# Patient Record
Sex: Female | Born: 1970 | Race: Asian | Hispanic: No | Marital: Married | Smoking: Never smoker
Health system: Southern US, Community
[De-identification: ages and names within clinical notes are randomized; demographics above are authoritative.]

## PROBLEM LIST (undated history)

## (undated) DIAGNOSIS — D649 Anemia, unspecified: Secondary | ICD-10-CM

## (undated) DIAGNOSIS — T8859XA Other complications of anesthesia, initial encounter: Secondary | ICD-10-CM

## (undated) DIAGNOSIS — J329 Chronic sinusitis, unspecified: Secondary | ICD-10-CM

## (undated) DIAGNOSIS — E041 Nontoxic single thyroid nodule: Secondary | ICD-10-CM

## (undated) DIAGNOSIS — B019 Varicella without complication: Secondary | ICD-10-CM

## (undated) DIAGNOSIS — Q674 Other congenital deformities of skull, face and jaw: Secondary | ICD-10-CM

## (undated) DIAGNOSIS — Z8709 Personal history of other diseases of the respiratory system: Secondary | ICD-10-CM

## (undated) DIAGNOSIS — N390 Urinary tract infection, site not specified: Secondary | ICD-10-CM

## (undated) DIAGNOSIS — R011 Cardiac murmur, unspecified: Secondary | ICD-10-CM

## (undated) DIAGNOSIS — R519 Headache, unspecified: Secondary | ICD-10-CM

## (undated) DIAGNOSIS — R7611 Nonspecific reaction to tuberculin skin test without active tuberculosis: Secondary | ICD-10-CM

## (undated) DIAGNOSIS — E162 Hypoglycemia, unspecified: Secondary | ICD-10-CM

## (undated) DIAGNOSIS — J189 Pneumonia, unspecified organism: Secondary | ICD-10-CM

## (undated) DIAGNOSIS — T7840XA Allergy, unspecified, initial encounter: Secondary | ICD-10-CM

## (undated) HISTORY — DX: Varicella without complication: B01.9

## (undated) HISTORY — PX: TUBAL LIGATION: SHX77

## (undated) HISTORY — DX: Nonspecific reaction to tuberculin skin test without active tuberculosis: R76.11

## (undated) HISTORY — DX: Allergy, unspecified, initial encounter: T78.40XA

---

## 2005-01-01 ENCOUNTER — Ambulatory Visit (HOSPITAL_COMMUNITY): Admission: RE | Admit: 2005-01-01 | Discharge: 2005-01-01 | Payer: Self-pay | Admitting: Infectious Diseases

## 2005-01-28 ENCOUNTER — Ambulatory Visit (HOSPITAL_COMMUNITY): Admission: RE | Admit: 2005-01-28 | Discharge: 2005-01-28 | Payer: Self-pay | Admitting: Infectious Diseases

## 2005-01-28 ENCOUNTER — Ambulatory Visit: Payer: Self-pay | Admitting: Infectious Diseases

## 2005-02-06 ENCOUNTER — Ambulatory Visit: Payer: Self-pay | Admitting: Infectious Diseases

## 2005-08-12 HISTORY — PX: TUBAL LIGATION: SHX77

## 2005-11-06 ENCOUNTER — Other Ambulatory Visit: Admission: RE | Admit: 2005-11-06 | Discharge: 2005-11-06 | Payer: Self-pay | Admitting: Obstetrics and Gynecology

## 2006-05-17 ENCOUNTER — Encounter (INDEPENDENT_AMBULATORY_CARE_PROVIDER_SITE_OTHER): Payer: Self-pay | Admitting: Specialist

## 2006-05-17 ENCOUNTER — Inpatient Hospital Stay (HOSPITAL_COMMUNITY): Admission: AD | Admit: 2006-05-17 | Discharge: 2006-05-20 | Payer: Self-pay | Admitting: Obstetrics and Gynecology

## 2006-10-27 ENCOUNTER — Ambulatory Visit (HOSPITAL_COMMUNITY): Admission: RE | Admit: 2006-10-27 | Discharge: 2006-10-27 | Payer: Self-pay | Admitting: Infectious Diseases

## 2006-11-06 ENCOUNTER — Ambulatory Visit: Payer: Self-pay | Admitting: Critical Care Medicine

## 2006-11-06 ENCOUNTER — Encounter (INDEPENDENT_AMBULATORY_CARE_PROVIDER_SITE_OTHER): Payer: Self-pay | Admitting: Infectious Diseases

## 2006-11-11 ENCOUNTER — Ambulatory Visit: Payer: Self-pay | Admitting: *Deleted

## 2007-02-19 ENCOUNTER — Ambulatory Visit: Payer: Self-pay | Admitting: Critical Care Medicine

## 2007-03-04 ENCOUNTER — Emergency Department (HOSPITAL_COMMUNITY): Admission: EM | Admit: 2007-03-04 | Discharge: 2007-03-04 | Payer: Self-pay | Admitting: Emergency Medicine

## 2009-01-09 ENCOUNTER — Emergency Department (HOSPITAL_COMMUNITY): Admission: EM | Admit: 2009-01-09 | Discharge: 2009-01-09 | Payer: Self-pay | Admitting: Family Medicine

## 2009-08-21 ENCOUNTER — Emergency Department (HOSPITAL_COMMUNITY): Admission: EM | Admit: 2009-08-21 | Discharge: 2009-08-21 | Payer: Self-pay | Admitting: Family Medicine

## 2010-12-25 NOTE — Assessment & Plan Note (Signed)
[facility]                             PULMONARY OFFICE NOTE   Amy Terry, Amy Terry                   MRN:          [id number]  DATE:02/19/2007                            DOB:          [birth year]    [name] returns today in followup.  She has left lingular  bronchiectasis with no evidence of active cough or no respiratory  complaints.   EXAM:  Temp 98, blood pressure 104/70, pulse 81, saturation 98% room  air.  CHEST:  Completely clear without evidence of wheeze, rale or rhonchi.  CARDIAC EXAM:  A regular rate and rhythm without S3, normal S1, S2.   A CT scan of the chest obtained recently shows minimal lingular  bronchiectasis and no evidence of disease activity.   IMPRESSION:  The patient has stable lingular right bronchiectasis with  positive PPD and no evidence of active tuberculosis infection.  At this  point, no recommended therapies are indicated and will see the patient  back on an as needed basis.     Charlcie Cradle Delford Field, MD, [facility]  Electronically Signed    PEW/MedQ  DD: 02/19/2007  DT: 02/19/2007  Job #: [id number]

## 2010-12-28 NOTE — H&P (Signed)
NAMESHARANYA, TEMPLIN            ACCOUNT NO.:  [id number]   MEDICAL RECORD NO.:  [id number]          PATIENT TYPE:  INP   LOCATION:  9171                          [facility]   PHYSICIAN:  Osborn Coho, M.D.   DATE OF BIRTH:  09-17-1970   DATE OF ADMISSION:  05/17/2006  DATE OF DISCHARGE:                                HISTORY & PHYSICAL   Ms. Rolfson is a 40 year old gravida 2, para 1-0-0-1 at 39-2/7 weeks who  presented complaining of spontaneous rupture of membranes at approximately 3  a.m. with clear fluid noted. The patient denies any pain. She reports  positive fetal movement. Pregnancy has been remarkable for:  1. Advanced maternal age with amnio and quadruple screen declined.  2. Strong family history of thyroid disease. The patient's labs were      within normal limits at 32 weeks.  3. Questionable last menstrual period.   PRENATAL LABORATORY:  Blood type is O positive. Rh antibody negative. VDRL  nonreactive. Rubella titer positive. Hepatitis B surface antigen negative.  HIV nonreactive. Hemoglobin electrophoresis was normal. Cystic fibrosis  testing was negative. Hemoglobin upon entering the practice was 12.7; it was  12 at 26 weeks. Quadruple screen was declined. TSH was done at the initial  visit and it was low at 0.05. It was reported again at 15 weeks with a value  of 0.242. Free T4 was normal and T3 was normal. Values were repeated again  at 22 weeks and they were within normal limits. Third trimester values were  also normal. The patient's Glucola was normal. GG, chlamydia and group B  Strep cultures were all negative at 36 weeks. EDC of May 23, 2006, was  established by ultrasound at 11 weeks.   HISTORY OF PRESENT PREGNANCY:  The patient entered care at approximately 10  weeks. She had an ultrasound at that time for dating. There was a complete  previa noted. She declined amnio and quadruple screen. Hemoglobin  electrophoresis was normal. Thyroid  values were done secondary to strong  family history of thyroid disease. Her TSH was low as previously noted at  her first visit, repeated again at 15 weeks which was still slightly low but  free T4 and T3 were normal. The other values in each subsequent trimester  were normal. She had an ultrasound at 18 weeks showing normal growth and  development. She did have a left choroid plexus cyst. She had a normal  Glucola. She had another ultrasound at 28 weeks showing normal growth.  Choroid plexus cyst was resolved. She had an ultrasound at approximately 36  weeks for presentation and the fetus was in a vertex presentation at that  time. The rest of her pregnancy was essentially uncomplicated. On her last  exam in the office she was 3 cm.   OBSTETRICAL HISTORY:  In 2000 she had a vaginal birth of a female infant,  weight 7 pounds 7 ounces at [redacted] weeks gestation. She was in labor  approximately 36 hours with a long prodromal phase. [place] child was [place] During that pregnancy she did have a partial  placenta  previa however she did deliver vaginally with no pain medication.   MEDICAL HISTORY:  She was on birth control pills before her first pregnancy.  She reports usual childhood illnesses. She had a UTI in 1991. She was  hospitalized for tonsillitis in the past. She does have a history of some  sporadic gastritis.   ALLERGIES:  SHE IS ALLERGIC TO SEAFOOD WHICH CAUSES ITCHING. THERE IS NO  EVIDENCE OF ALLERGY TO IODINE.   FAMILY HISTORY:  Her brothers and father have history of hypertension and  are deceased. Maternal grandmother has emphysema. Her 3 sisters have had  thyroidectomies. Her brother had a nephrectomy. Paternal grandmother had  Parkinson's disease. Remarkable for the patient being 35 at the time of  delivery. Her brother was also born with shorter fingers on one hand. The  father of the baby's nephew was born with some type of mental retardation.   SOCIAL  HISTORY:  The patient is married to the father of the baby. He is  involved and supportive. His name is Archivist. The patient is [place]  from [place] She is of the [employer]. She is college educated  and employed as a Designer, jewellery at [employer]. Her husband is also  graduate educated. She has been followed by the physician's service at  [facility]. She denies any alcohol, drug or tobacco use during this  pregnancy.   PHYSICAL EXAMINATION:  VITAL SIGNS:  Stable. The patient is afebrile.  HEENT:  Within normal limits.  LUNGS:  Breath sounds are clear.  HEART:  Regular rate and rhythm without murmur.  BREASTS:  Soft and nontender.  ABDOMEN:  Fundal height is approximately 38 cm. Estimated fetal weight 7 to  7-1/2 pounds. Uterine contractions are every 3-6 minutes, mild quality.  PELVIC:  Cervical exam is 3-4 cm, 50%, vertex at a -2 station. The patient  is noted to be leaking clear fluid.  EXTREMITIES:  Deep tendon reflexes are 2+ without clonus. There is no edema  noted. Fetal heart rate is reactive with no decelerations.   IMPRESSION:  1. Intrauterine pregnancy at 39-2/7 weeks.  2. Early labor with spontaneous rupture of membranes.  3. Negative group B Streptococcus.   PLAN:  1. Admit to birthing suite for consult with Dr. Su Hilt, who is attending      physician.  2. Routine physician orders.  3. Patient declines pain medication at present.  4. M.D.'s will follow.      Renaldo Reel Emilee Hero, C.N.M.      Osborn Coho, M.D.  Electronically Signed    VLL/MEDQ  D:  05/17/2006  T:  05/17/2006  Job:  [id number]

## 2010-12-28 NOTE — Assessment & Plan Note (Signed)
[facility]                             PULMONARY OFFICE NOTE   Amy Terry, Amy Terry                   MRN:          [id number]  DATE:11/13/2006                            DOB:          1971/02/09    Ms. [name]'s CT scan was reviewed.  She has minimal left lingular  bronchiectasis and no evidence of active or communicable tuberculosis.  At this point, I would not recommend therapy or bronchoscopy in this  patient.  The patient has been given this information.     Charlcie Cradle Delford Field, MD, [facility]  Electronically Signed    PEW/MedQ  DD: 11/13/2006  DT: 11/13/2006  Job #: [id number]   cc:   Rockey Situ. Flavia Shipper., M.D.

## 2010-12-28 NOTE — Op Note (Signed)
NAMELEILENE, DIPRIMA            ACCOUNT NO.:  [id number]   MEDICAL RECORD NO.:  [id number]          PATIENT TYPE:  INP   LOCATION:  9106                          [facility]   PHYSICIAN:  Osborn Coho, M.D.   DATE OF BIRTH:  1970/12/01   DATE OF PROCEDURE:  05/17/2006  DATE OF DISCHARGE:                                 OPERATIVE REPORT   PREOPERATIVE DIAGNOSES:  1. Term intrauterine pregnancy.  2. Spontaneous rupture of membranes, in early labor.  3. Augmentation  4. Failure of descent.  5. Desires permanent sterilization.   POSTOPERATIVE DIAGNOSES:  1. Term intrauterine pregnancy.  2. Spontaneous rupture of membranes, in early labor.  3. Augmentation  4. Failure of descent.  5. Desires permanent sterilization.   PROCEDURES:  1. Primary low transverse C-section.  2. Bilateral tubal ligation.   ANESTHESIA:  Epidural.   ATTENDING:  Osborn Coho, M.D.   ASSISTANT:  Cam Hai, C.N.M.   FLUIDS:  2100 cc.   URINE OUTPUT:  200 cc.   ESTIMATED BLOOD LOSS:  900 cc.   COMPLICATIONS:  None.   FINDINGS:  Live female infant, with Apgars of 9 at one minute and 10 at 5  minutes, Caryn Bee.  Normal-appearing bilateral ovaries and fallopian tubes.   SPECIMENS TO PATHOLOGY:  Placenta and bilateral portions of fallopian tubes.   PROCEDURE:  The patient was taken to the operating room after the risks,  benefits, and alternatives reviewed with the patient.  The patient  verbalized understanding, and consent signed and witnessed. The patient was  given a surgical level via the epidural, and prepped and draped in the  normal sterile fashion.  A Pfannenstiel skin incision was made and carried  down to the underlying layer of fascia with the scalpel and Bovie.  The  fascia was excised bilaterally in the midline with the scalpel and extended  bilaterally with the Mayo scissors.  Kocher clamps were placed on the  inferior aspect of the fascial incision, and the rectus muscle  excised from  the fascia.  The same was done on the superior aspect of the fascial  incision.  The rectus muscle was separated in the midline and the peritoneum  entered bluntly and extended manually.  The bladder blade was placed and  bladder flap created with the Metzenbaum scissors.  Uterine incision was  made the scalpel and extended bilaterally with the bandage scissors.  The  infant was delivered, and a body cord was noted.  Upon delivery of the  infant's head, the oropharynx and nasopharynx were bulb suctioned.  The cord  was clamped and cut, and the infant handed to the waiting pediatricians.  The placenta was removed via fundal massage, and uterus cleared of all clots  and debris.  2 g of Ancef was administered.  The incision was repaired with  0 Vicryl in a running-locked fashion, and a second imbricating layer was  performed.  The intra-abdominal cavity was irrigated, and the uterus was  exteriorized.  The right fallopian tube was grasped in its midportion, and  two ties of 2-0 plain were used to ligate the tube,  and the tube was excised  at the remaining stump and cauterized with the Bovie.  The same was done on  the left fallopian tube.  The portions of the bilateral fallopian tubes were  sent to pathology.  There was good hemostasis noted, and the uterus was  returned to the intra-abdominal cavity.  Copious irrigation was performed  once again, and normal-appearing bilateral ovaries and fallopian tubes were  noted.  The peritoneum was repaired with 2-0 chromic via a running stitch,  and the fascia was repaired with 0 Vicryl in a running fashion.  The  subcutaneous tissue was irrigated and made hemostatic with the Bovie and  reapproximated using three interrupted stitches of 2-0 plain.  The skin was  reapproximated using 3-0 Monocryl via a subcuticular stitch.  Half-inch  Steri-Strips were applied.  Sponge, lap, and needle count was correct.  The  patient tolerated the  procedure well and was returned to the recovery room  in good condition.      Osborn Coho, M.D.  Electronically Signed     AR/MEDQ  D:  05/17/2006  T:  05/19/2006  Job:  [id number]

## 2010-12-28 NOTE — Discharge Summary (Signed)
NAMETIFFANY, CALMES            ACCOUNT NO.:  [id number]   MEDICAL RECORD NO.:  [id number]          PATIENT TYPE:  INP   LOCATION:  9106                          [facility]   PHYSICIAN:  Osborn Coho, M.D.   DATE OF BIRTH:  May 31, 1971   DATE OF ADMISSION:  05/17/2006  DATE OF DISCHARGE:  05/20/2006                                 DISCHARGE SUMMARY   ADMISSION DIAGNOSES:  1. Intrauterine pregnancy at term.  2. Early labor.  3. Premature rupture of membranes.  4. Desires sterilization.   PROCEDURES:  1. Primary low transverse versus cesarean section.  2. Bilateral tubal ligation.   DISCHARGE DIAGNOSES:  1. Intrauterine pregnancy at term.  2. Premature rupture of membranes.  3. Failure of descent.  4. Sterilization.  5. Primary low transverse cesarean section.  6. Bilateral tubal ligation.   Ms. Castanon is a 40 year old gravida 2, para 1-0-0-1, who presents at term  with premature rupture of membranes at 3-4 cm.  Her labor was augmented with  Pitocin.  She did become complete and pushed for 2 hours with descent to a  +1 to +2 station and no further descent.  Options were discussed and the  patient agreed to proceed with primary low cesarean section and BTL for  failure to descend.  This was done on May 17, 2006, with Dr. Osborn Coho as surgeon.  The patient gave birth to an 8 pound 8 ounce female  infant named Caryn Bee with Apgar scores of 9 at one minute, 10 at five minutes.  Both the patient and infant have done well in the postpartum period.  Hemoglobin on the first postpartum day was 8.6.  the patient has been  asymptomatic in her anemia.  She is up ad lib. without any difficulties.  Her incision is clean, dry and intact and on this, her third postoperative  day she is judged to be in satisfactory condition for discharge.  Discharge  instructions per [facility] handout.   DISCHARGE MEDICATIONS:  1. Motrin 600 mg p.o. q.6h. p.r.n. pain.  2. Tylox one  to two p.o. q.3-4h. p.r.n. pain.  3. Prenatal vitamins.   Discharge follow-up will be at [facility] in 6 weeks.      Rica Koyanagi, C.N.M.      Osborn Coho, M.D.  Electronically Signed    SDM/MEDQ  D:  05/20/2006  T:  05/21/2006  Job:  [id number]

## 2010-12-28 NOTE — Assessment & Plan Note (Signed)
[facility]                             PULMONARY OFFICE NOTE   Amy Terry, Amy Terry                   MRN:          [id number]  DATE:11/06/2006                            DOB:          1971/01/31    CHIEF COMPLAINT:  Evaluate abnormal x-ray.   HISTORY OF PRESENT ILLNESS:  This is a 40 year old Filipino female new  to the [place] in 2002. Had a positive PPD prior to moving here. 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 She had an abnormal x-ray documented in 2006. It was  recommended that she take isoniazid. She took it for one month, became  pregnant and then stopped the medication. She has had no cough, dyspnea,  chest pain, fever, chills, sweats or any other constitutional symptoms.  Her weight has been stable. Recent repeat chest x-ray shows a nodule of  9-mm left upper lobe in addition to continuing left lingular infiltrate.  She is referred for further evaluation by Infectious Disease.   REVIEW OF SYSTEMS:  Otherwise, is noncontributory. She is a lifelong  never smoker.   PAST MEDICAL HISTORY:   MEDICAL HISTORY:  1. Allergies only.  2. C-section.  3. Tubal ligation.   MEDICATION ALLERGIES:  None.   MEDICATIONS:  No daily medications.   SOCIAL HISTORY:  Works as an [employer] fundraiser. Lives with her husband and children at  home. Does not smoke or drink.   FAMILY HISTORY:  Emphysema in mother, asthma in a sister. Review of  systems is otherwise noncontributory.   PHYSICAL EXAMINATION:  This is a well-developed, well-nourished white-  Asian female in no distress. Temperature 98.4, blood pressure 106/70,  pulse 87. Saturation is 97% on room air.  CHEST: Showed to be completely clear without evidence of wheeze or  rhonchi or rales.  CARDIAC: Showed a regular rate and rhythm without S3. Normal S1, S2.  ABDOMEN: Soft, nontender.  EXTREMITIES: Showed no edema or clubbing or venous disease.  SKIN: Was  clear.  NEUROLOGIC: Was intact.  HEENT: Showed no jugular venous distention. No lymphadenopathy.  Oropharynx was clear. Neck supple.   Chest x-ray is reviewed and showed nodule in the left upper lobe of 9-mm  in diameter and left lingular infiltrate.   IMPRESSION:  Here is that of probable exposure to tuberculosis with  perhaps reactivation.   PLAN:  Is to obtain a CT scan of the chest and based upon this, we may  pursue bronchoscopy.     Charlcie Cradle Delford Field, MD, [facility]  Electronically Signed    PEW/MedQ  DD: 11/06/2006  DT: 11/06/2006  Job #: [id number]   cc:   Rockey Situ. Flavia Shipper., M.D.

## 2011-05-27 LAB — URINE CULTURE

## 2011-05-27 LAB — POCT URINALYSIS DIP (DEVICE)
Glucose, UA: NEGATIVE
Hgb urine dipstick: NEGATIVE
Nitrite: POSITIVE — AB
Urobilinogen, UA: 0.2
pH: 6

## 2011-08-16 ENCOUNTER — Emergency Department (HOSPITAL_BASED_OUTPATIENT_CLINIC_OR_DEPARTMENT_OTHER)
Admission: EM | Admit: 2011-08-16 | Discharge: 2011-08-16 | Disposition: A | Discharge status: Home / Self Care | Payer: 59 | Attending: Emergency Medicine | Admitting: Emergency Medicine

## 2011-08-16 ENCOUNTER — Emergency Department (INDEPENDENT_AMBULATORY_CARE_PROVIDER_SITE_OTHER): Payer: 59

## 2011-08-16 DIAGNOSIS — R05 Cough: Secondary | ICD-10-CM

## 2011-08-16 DIAGNOSIS — J189 Pneumonia, unspecified organism: Secondary | ICD-10-CM | POA: Insufficient documentation

## 2011-08-16 DIAGNOSIS — R0602 Shortness of breath: Secondary | ICD-10-CM | POA: Insufficient documentation

## 2011-08-16 DIAGNOSIS — R509 Fever, unspecified: Secondary | ICD-10-CM

## 2011-08-16 DIAGNOSIS — R059 Cough, unspecified: Secondary | ICD-10-CM

## 2011-08-16 LAB — BASIC METABOLIC PANEL
BUN: 9 mg/dL (ref 6–23)
Chloride: 101 mEq/L (ref 96–112)
GFR calc Af Amer: >90 mL/min (ref >90)
Potassium: 3.5 mEq/L (ref 3.5–5.1)

## 2011-08-16 LAB — URINALYSIS, ROUTINE W REFLEX MICROSCOPIC
Bilirubin Urine: NEGATIVE
Hgb urine dipstick: NEGATIVE
Nitrite: NEGATIVE
Specific Gravity, Urine: 1.02 (ref 1.005–1.030)
pH: 7 (ref 5.0–8.0)

## 2011-08-16 LAB — CULTURE, BLOOD (ROUTINE X 2): Culture  Setup Time: 201301042355

## 2011-08-16 LAB — DIFFERENTIAL
Lymphocytes Relative: 6 % — ABNORMAL LOW (ref 12–46)
Lymphs Abs: 1 10*3/uL (ref 0.7–4.0)
Monocytes Absolute: 0.8 10*3/uL (ref 0.1–1.0)
Monocytes Relative: 5 % (ref 3–12)
Neutro Abs: 16.1 10*3/uL — ABNORMAL HIGH (ref 1.7–7.7)

## 2011-08-16 LAB — CBC
HCT: 34.2 % — ABNORMAL LOW (ref 36.0–46.0)
Hemoglobin: 11.2 g/dL — ABNORMAL LOW (ref 12.0–15.0)
WBC: 17.9 10*3/uL — ABNORMAL HIGH (ref 4.0–10.5)

## 2011-08-16 MED ORDER — DEXTROSE 5 % IV SOLN
500.0000 mg | Freq: Once | INTRAVENOUS | Status: AC
  Administered 2011-08-16: 500 mg via INTRAVENOUS
  Filled 2011-08-16: qty 500

## 2011-08-16 MED ORDER — OXYCODONE-ACETAMINOPHEN 5-325 MG PO TABS: 1.0000 | ORAL_TABLET | ORAL | Status: AC | PRN

## 2011-08-16 MED ORDER — OXYCODONE-ACETAMINOPHEN 5-325 MG PO TABS
1.0000 | ORAL_TABLET | Freq: Once | ORAL | Status: AC
  Administered 2011-08-16: 1 via ORAL
  Filled 2011-08-16: qty 1

## 2011-08-16 MED ORDER — AZITHROMYCIN 250 MG PO TABS: 250.0000 mg | ORAL_TABLET | Freq: Every day | ORAL | Status: AC

## 2011-08-16 MED ORDER — DEXTROSE 5 % IV SOLN
1.0000 g | Freq: Once | INTRAVENOUS | Status: AC
  Administered 2011-08-16: 1 g via INTRAVENOUS
  Filled 2011-08-16: qty 10

## 2011-08-16 MED ORDER — OSELTAMIVIR PHOSPHATE 75 MG PO CAPS: 75.0000 mg | ORAL_CAPSULE | Freq: Two times a day (BID) | ORAL | Status: AC

## 2011-08-16 MED ORDER — ACETAMINOPHEN 325 MG PO TABS
650.0000 mg | ORAL_TABLET | Freq: Once | ORAL | Status: AC
  Administered 2011-08-16: 650 mg via ORAL
  Filled 2011-08-16: qty 2

## 2011-08-16 MED ORDER — SODIUM CHLORIDE 0.9 % IV BOLUS (SEPSIS)
1000.0000 mL | Freq: Once | INTRAVENOUS | Status: AC
  Administered 2011-08-16: 1000 mL via INTRAVENOUS

## 2011-08-16 NOTE — ED Provider Notes (Signed)
History     CSN: [id number]  Arrival date & time 08/16/11  1247   First MD Initiated Contact with Patient 08/16/11 1302      Chief Complaint  Patient presents with  . Shortness of Breath  . Cough  . Fever   patient had cough, and cold symptoms for one month. She did see her primary care doctor, Dr. Edmon Crape who prescribed amoxicillin for 10 days. This morning. However, she states she felt worse, sick and weak. She had a fever noted in triage. Patient has had a productive cough of green phlegm and has had some pain in her right upper chest especially with coughing. She's had no nausea, vomiting, no sore throat, no rash, no neck pain or stiffness. 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 Denies any other complaints at this time.  (Consider location/radiation/quality/duration/timing/severity/associated sxs/prior treatment) HPI  History reviewed. No pertinent past medical history.  Past Surgical History  Procedure Date  . Cesarean section     No family history on file.  History  Substance Use Topics  . Smoking status: Not on file  . Smokeless tobacco: Never Used  . Alcohol Use: No    OB History    Grav Para Term Preterm Abortions TAB SAB Ect Mult Living                  Review of Systems  All other systems reviewed and are negative.    Allergies  Review of patient's allergies indicates no known allergies.  Home Medications   Current Outpatient Rx  Name Route Sig Dispense Refill  . FLUTICASONE PROPIONATE 50 MCG/ACT NA SUSP Nasal Place 2 sprays into the nose as needed.        BP 127/75  Pulse 117  Temp(Src) 101.6 F (38.7 C) (Oral)  Resp 18  Ht 5' (1.524 m)  Wt 100 lb (45.36 kg)  BMI 19.53 kg/m2  SpO2 100%  LMP 07/26/2011  Physical Exam  Nursing note and vitals reviewed. Constitutional: She is oriented to person, place, and time. She appears well-developed and well-nourished.  HENT:  Head: Normocephalic  and atraumatic.  Eyes: Conjunctivae and EOM are normal. Pupils are equal, round, and reactive to light.  Neck: Neck supple.  Cardiovascular: Regular rhythm.  Exam reveals no gallop and no friction rub.   No murmur heard.      Slightly tachycardic, no murmur, rub, or gallop.  Pulmonary/Chest: Breath sounds normal. No respiratory distress. She has no wheezes. She has no rales. She exhibits no tenderness.       Lungs are clear to auscultation, bilaterally, no wheezing, rales, or rhonchi. Respirations are even and unlabored.  Abdominal: Soft. Bowel sounds are normal. She exhibits no distension. There is no tenderness. There is no rebound and no guarding.  Musculoskeletal: Normal range of motion.  Neurological: She is alert and oriented to person, place, and time. No cranial nerve deficit. Coordination normal.  Skin: Skin is warm and dry. No rash noted.  Psychiatric: She has a normal mood and affect.    ED Course  Procedures (including critical care time)  Labs Reviewed - No data to display No results found.   No diagnosis found.    MDM  Pt is seen and examined;  Initial history and physical completed.  Will follow.     Tylenol for fever, chest x-ray ordered. Otherwise, appears quite stable at this time. Will follow closely  1:52 PM  Ivf, blood cx, lactate, labs, iv abx  Irais Mottram A. Patrica Duel, MD 08/17/11 [id number]

## 2011-08-16 NOTE — ED Notes (Signed)
Family at bedside. 

## 2011-08-16 NOTE — ED Notes (Signed)
Patient transported to X-ray 

## 2011-08-16 NOTE — ED Notes (Signed)
MD at bedside. 

## 2011-08-16 NOTE — ED Notes (Signed)
Pt ambulated in hallway without difficulty.  Family at bedside.

## 2011-08-16 NOTE — ED Notes (Signed)
Pt reports productive cough and "being sick" x 1 month but developed SHOB and pain in upper back this am.

## 2012-02-02 ENCOUNTER — Emergency Department (HOSPITAL_COMMUNITY)
Admission: EM | Admit: 2012-02-02 | Discharge: 2012-02-02 | Disposition: A | Discharge status: Home / Self Care | Payer: 59 | Source: Home / Self Care | Attending: Emergency Medicine | Admitting: Emergency Medicine

## 2012-02-02 ENCOUNTER — Encounter (HOSPITAL_COMMUNITY): Payer: Self-pay | Admitting: *Deleted

## 2012-02-02 DIAGNOSIS — J069 Acute upper respiratory infection, unspecified: Secondary | ICD-10-CM

## 2012-02-02 DIAGNOSIS — J040 Acute laryngitis: Secondary | ICD-10-CM

## 2012-02-02 HISTORY — DX: Chronic sinusitis, unspecified: J32.9

## 2012-02-02 NOTE — Discharge Instructions (Signed)
Use saline spray in your nose to help relieve your congestion.  Continue using the advil cold and sinus medicine as directed on the box to help manage your symptoms until you are well.  Drink LOTS of liquids. Gargle with salt water to help relieve your sore throat.  Drink hot herbal tea (such as peppermint or chamomile) with fresh lemon squeezed into it to help relieve your sore throat. Talk with your primary care doctor about FMLA forms.   Upper Respiratory Infection, Adult An upper respiratory infection (URI) is also known as the common cold. It is often caused by a type of germ (virus). Colds are easily spread (contagious). You can pass it to others by kissing, coughing, sneezing, or drinking out of the same glass. Usually, you get better in 1 or 2 weeks.  HOME CARE   Only take medicine as told by your doctor.   Use a warm mist humidifier or breathe in steam from a hot shower.   Drink enough water and fluids to keep your pee (urine) clear or pale yellow.   Get plenty of rest.   Return to work when your temperature is back to normal or as told by your doctor. You may use a face mask and wash your hands to stop your cold from spreading.  GET HELP RIGHT AWAY IF:   After the first few days, you feel you are getting worse.   You have questions about your medicine.   You have chills, shortness of breath, or brown or red spit (mucus).   You have yellow or brown snot (nasal discharge) or pain in the face, especially when you bend forward.   You have a fever, puffy (swollen) neck, pain when you swallow, or white spots in the back of your throat.   You have a bad headache, ear pain, sinus pain, or chest pain.   You have a high-pitched whistling sound when you breathe in and out (wheezing).   You have a lasting cough or cough up blood.   You have sore muscles or a stiff neck.  MAKE SURE YOU:   Understand these instructions.   Will watch your condition.   Will get help right away if  you are not doing well or get worse.  Document Released: 01/15/2008 Document Revised: 07/18/2011 Document Reviewed: 12/03/2010 [facility] Patient Information 2012 [place], [place].  Laryngitis Your exam shows you have laryngitis. This condition is due to inflammation around the vocal cords and causes hoarseness and cough. Laryngitis can often be related to a virus infection, excessive smoking, excessive talking or yelling, breathing toxic fumes, allergies, or reflux of acid from your stomach. Treatment is mainly voice rest. Talk as little as possible (this includes whispering). Use written notes to communicate until your voice is back to normal. Avoid smoking cigarettes, drink plenty of clear liquids, and rest frequently until all your symptoms have improved. You should be checked by your doctor if your hoarseness and cough are not improved after 5 days. Please see your doctor or go to the emergency right away if you have:  Trouble breathing or blood in your sputum.   Difficulty swallowing, persistent fever or increasing pain.  Document Released: 07/29/2005 Document Revised: 04/10/2011 Document Reviewed: 01/14/2007 [facility] Patient Information 2012 [place], [place].

## 2012-02-02 NOTE — ED Provider Notes (Signed)
Medical screening examination/treatment/procedure(s) were performed by non-physician practitioner and as supervising physician I was immediately available for consultation/collaboration.  Luiz Blare MD   Luiz Blare, MD 02/02/12 2120

## 2012-02-02 NOTE — ED Provider Notes (Signed)
History     CSN: [id number]  Arrival date & time 02/02/12  1640   First MD Initiated Contact with Patient 02/02/12 1819      Chief Complaint  Patient presents with  . Laryngitis  . Sore Throat  . Eye Problem  . Cough    (Consider location/radiation/quality/duration/timing/severity/associated sxs/prior treatment) HPI Comments: Sx began on 6/21.  Was seen at an outside urgent care on 6/22, tested for strep which was negative. Told she has viral infection.  Works nights, and last night at work did lots of talking, during the night her voice slowly became more and more hoarse.   Patient is a 41 y.o. female presenting with pharyngitis and cough. The history is provided by the patient.  Sore Throat This is a new problem. The current episode started more than 2 days ago. The problem occurs constantly. The problem has not changed since onset.Associated symptoms include headaches. Pertinent negatives include no chest pain, no abdominal pain and no shortness of breath. The symptoms are aggravated by coughing. Nothing relieves the symptoms. Treatments tried: OTC cold medicine. The treatment provided moderate relief.  Cough This is a new problem. The current episode started more than 2 days ago. The problem occurs every few minutes. The problem has not changed since onset.The cough is non-productive. There has been no fever. Associated symptoms include headaches, rhinorrhea and sore throat. Pertinent negatives include no chest pain, no chills and no shortness of breath. She has tried decongestants for the symptoms. The treatment provided moderate relief.    Past Medical History  Diagnosis Date  . Sinusitis     Past Surgical History  Procedure Date  . Cesarean section   . Cesarean section   . Tubal ligation     History reviewed. No pertinent family history.  History  Substance Use Topics  . Smoking status: Never Smoker   . Smokeless tobacco: Never Used  . Alcohol Use: No    OB  History    Grav Para Term Preterm Abortions TAB SAB Ect Mult Living                  Review of Systems  Constitutional: Negative for fever and chills.  HENT: Positive for congestion, sore throat, rhinorrhea, voice change, postnasal drip and sinus pressure.   Respiratory: Positive for cough. Negative for shortness of breath.   Cardiovascular: Negative for chest pain.  Gastrointestinal: Negative for abdominal pain.  Neurological: Positive for headaches.    Allergies  Review of patient's allergies indicates no known allergies.  Home Medications   Current Outpatient Rx  Name Route Sig Dispense Refill  . ADVIL COLD/SINUS PO Oral Take by mouth.    Marland Kitchen FLUTICASONE PROPIONATE 50 MCG/ACT NA SUSP Nasal Place 2 sprays into the nose as needed.        BP 108/61  Pulse 98  Temp 99.3 F (37.4 C) (Oral)  Resp 16  SpO2 98%  LMP 01/07/2012  Physical Exam  Constitutional: She appears well-developed and well-nourished. No distress.  HENT:  Right Ear: Tympanic membrane, external ear and ear canal normal.  Left Ear: Tympanic membrane, external ear and ear canal normal.  Nose: Mucosal edema present. Right sinus exhibits maxillary sinus tenderness and frontal sinus tenderness. Left sinus exhibits maxillary sinus tenderness and frontal sinus tenderness.  Mouth/Throat: Oropharynx is clear and moist.       Voice hoarse  Neck: Normal range of motion.  Cardiovascular: Normal rate and regular rhythm.   Pulmonary/Chest: Effort normal and  breath sounds normal.  Lymphadenopathy:       Head (right side): No submental, no submandibular and no tonsillar adenopathy present.       Head (left side): No submental, no submandibular and no tonsillar adenopathy present.    She has no cervical adenopathy.    ED Course  Procedures (including critical care time)  Labs Reviewed - No data to display No results found.   1. URI (upper respiratory infection)   2. Laryngitis       MDM  Pt sx most c/w  viral infection.  Discussed sx management with pt.  Encouraged the resting of her voice, offered her a note for work.  Pt asks for FMLA papers for this illness.  I explained I did not have the papers she needed, and recommended she speak with her pcp about this.          Cathlyn Parsons, NP 02/02/12 1850

## 2012-02-02 NOTE — ED Notes (Signed)
Pt with onset of cough/sore throat and bodyaches Thursday this morning voice hoarse and right eye drainage

## 2013-08-12 HISTORY — PX: VARICOSE VEIN SURGERY: SHX832

## 2013-11-01 ENCOUNTER — Emergency Department (HOSPITAL_COMMUNITY)
Admission: EM | Admit: 2013-11-01 | Discharge: 2013-11-01 | Disposition: A | Discharge status: Home / Self Care | Payer: 59 | Source: Home / Self Care

## 2013-11-12 ENCOUNTER — Encounter (HOSPITAL_COMMUNITY): Payer: Self-pay | Admitting: Emergency Medicine

## 2013-11-12 ENCOUNTER — Emergency Department (HOSPITAL_COMMUNITY)
Admission: EM | Admit: 2013-11-12 | Discharge: 2013-11-12 | Disposition: A | Discharge status: Home / Self Care | Payer: 59 | Source: Home / Self Care | Attending: Emergency Medicine | Admitting: Emergency Medicine

## 2013-11-12 DIAGNOSIS — R059 Cough, unspecified: Secondary | ICD-10-CM

## 2013-11-12 DIAGNOSIS — R05 Cough: Secondary | ICD-10-CM

## 2013-11-12 DIAGNOSIS — B302 Viral pharyngoconjunctivitis: Secondary | ICD-10-CM

## 2013-11-12 LAB — POCT RAPID STREP A: Streptococcus, Group A Screen (Direct): NEGATIVE

## 2013-11-12 MED ORDER — PSEUDOEPH-BROMPHEN-DM 30-2-10 MG/5ML PO SYRP: 5.0000 mL | ORAL_SOLUTION | ORAL | Status: DC | PRN

## 2013-11-12 NOTE — ED Provider Notes (Signed)
Medical screening examination/treatment/procedure(s) were performed by non-physician practitioner and as supervising physician I was immediately available for consultation/collaboration.  Philipp Deputy, M.D.   Harden Mo, MD 11/12/13 2136

## 2013-11-12 NOTE — ED Provider Notes (Signed)
CSN: [id number]     Arrival date & time 11/12/13  1733 History   First MD Initiated Contact with Patient 11/12/13 1906     Chief Complaint  Patient presents with  . Sore Throat   (Consider location/radiation/quality/duration/timing/severity/associated sxs/prior Treatment) HPI Comments: 43 year old female presents complaining of cough that started yesterday, and sore throat/crusting of her eyes today. She has also lost her voice today. The sore throat has gotten slightly better throughout the day. She has not had any fever, chills, NVD, chest pain, shortness of breath. She has close contact with her daughter had a very similar illness a few days ago.  Patient is a 43 y.o. female presenting with pharyngitis.  Sore Throat Pertinent negatives include no chest pain, no abdominal pain and no shortness of breath.    Past Medical History  Diagnosis Date  . Sinusitis    Past Surgical History  Procedure Laterality Date  . Cesarean section    . Cesarean section    . Tubal ligation     No family history on file. History  Substance Use Topics  . Smoking status: Never Smoker   . Smokeless tobacco: Never Used  . Alcohol Use: No   OB History   Grav Para Term Preterm Abortions TAB SAB Ect Mult Living                 Review of Systems  Constitutional: Negative for fever and chills.  HENT: Positive for sore throat. Negative for congestion.   Eyes: Positive for discharge and redness. Negative for visual disturbance.  Respiratory: Positive for cough. Negative for shortness of breath.   Cardiovascular: Negative for chest pain, palpitations and leg swelling.  Gastrointestinal: Negative for nausea, vomiting and abdominal pain.  Endocrine: Negative for polydipsia and polyuria.  Genitourinary: Negative for dysuria, urgency and frequency.  Musculoskeletal: Negative for arthralgias and myalgias.  Skin: Negative for rash.  Neurological: Negative for dizziness, weakness and light-headedness.     Allergies  Review of patient's allergies indicates no known allergies.  Home Medications   Current Outpatient Rx  Name  Route  Sig  Dispense  Refill  . brompheniramine-pseudoephedrine-DM 30-2-10 MG/5ML syrup   Oral   Take 5 mLs by mouth every 4 (four) hours as needed.   120 mL   0   . fluticasone (FLONASE) 50 MCG/ACT nasal spray   Nasal   Place 2 sprays into the nose as needed.           . Pseudoephedrine-Ibuprofen (ADVIL COLD/SINUS PO)   Oral   Take by mouth.          BP 105/72  Pulse 82  Temp(Src) 98.7 F (37.1 C) (Oral)  Resp 16  SpO2 98% Physical Exam  Nursing note and vitals reviewed. Constitutional: She is oriented to person, place, and time. Vital signs are normal. She appears well-developed and well-nourished. No distress.  HENT:  Head: Normocephalic and atraumatic.  Right Ear: External ear normal.  Left Ear: External ear normal.  Nose: Nose normal.  Mouth/Throat: Posterior oropharyngeal erythema (mild) present. No oropharyngeal exudate.  Eyes: Conjunctivae are normal. Right eye exhibits no discharge. Left eye exhibits no discharge.  Neck: Normal range of motion. Neck supple.  Cardiovascular: Normal rate and regular rhythm.  Exam reveals no gallop and no friction rub.   Murmur heard.  Decrescendo systolic murmur is present with a grade of 3/6  Pulmonary/Chest: Effort normal and breath sounds normal. No respiratory distress. She has no wheezes. She has no rales.  Lymphadenopathy:    She has no cervical adenopathy.  Neurological: She is alert and oriented to person, place, and time. She has normal strength. Coordination normal.  Skin: Skin is warm and dry. No rash noted. She is not diaphoretic.  Psychiatric: She has a normal mood and affect. Judgment normal.    ED Course  Procedures (including critical care time) Labs Review Labs Reviewed  CULTURE, GROUP A STREP  POCT RAPID STREP A ([facility] ONLY)   Imaging Review No results found.   MDM    1. Viral pharyngoconjunctivitis   2. Cough    Treating symptomatically.  F/u PRN if not improving.  F/U with PCP about heart murmur   Meds ordered this encounter  Medications  . brompheniramine-pseudoephedrine-DM 30-2-10 MG/5ML syrup    Sig: Take 5 mLs by mouth every 4 (four) hours as needed.    Dispense:  120 mL    Refill:  0    Order Specific Question:  Supervising Provider    Answer:  Ihor Gully D Baldwyn, PA-C 11/12/13 1928

## 2013-11-12 NOTE — Discharge Instructions (Signed)
Take ibuprofen and tylenol as needed for the sore throat.    Adenovirus Adenoviruses are viruses that usually cause breathing problems. They may also cause other illnesses, such as stomach flu, bladder infection, and rashes. CAUSES  Adenoviruses are passed by direct contact. This can happen from touching the contaminated hands of someone who has just gone to the bathroom. It can also be passed through contaminated water.  You may have the virus and give it to others without being sick yourself.  Some types of this virus occur naturally in most parts of the world. Most of these infections occur in children.  Epidemics are often centered around swimming pools and small lakes. Symptoms can include fever and pink eye.  Adenovirus 7 is a specific virus gotten by breathing in the virus. It typically causes severe problems in the breathing system. Patients who get the adenovirus by the mouth usually have less severe symptoms. Adenovirus caught by breathing in the virus is more common in the late winter, spring, and early summer. SYMPTOMS  Symptoms vary and can include:   Common cold symptoms.  Pneumonia.  Croup.  Bronchitis. Patients with HIV, transplant patients, and some cancer patients are more likely to have severe problems. Acute respiratory disease (ARD) can be caused by adenovirus in crowded conditions.  These viruses are not easily killed with common cleaning products. DIAGNOSIS  Blood tests can be used to identify the problem.  TREATMENT  Most infections are mild and require no therapy. The symptoms can be treated to make the patient comfortable.  Document Released: 10/19/2002 Document Revised: 10/21/2011 Document Reviewed: 06/03/2007 [facility] Patient Information 2014 [place].    Pharyngitis Pharyngitis is redness, pain, and swelling (inflammation) of your pharynx.  CAUSES  Pharyngitis is usually caused by infection. Most of the time, these infections are from viruses  (viral) and are part of a cold. However, sometimes pharyngitis is caused by bacteria (bacterial). Pharyngitis can also be caused by allergies. Viral pharyngitis may be spread from person to person by coughing, sneezing, and personal items or utensils (cups, forks, spoons, toothbrushes). Bacterial pharyngitis may be spread from person to person by more intimate contact, such as kissing.  SIGNS AND SYMPTOMS  Symptoms of pharyngitis include:   Sore throat.   Tiredness (fatigue).   Low-grade fever.   Headache.  Joint pain and muscle aches.  Skin rashes.  Swollen lymph nodes.  Plaque-like film on throat or tonsils (often seen with bacterial pharyngitis). DIAGNOSIS  Your health care provider will ask you questions about your illness and your symptoms. Your medical history, along with a physical exam, is often all that is needed to diagnose pharyngitis. Sometimes, a rapid strep test is done. Other lab tests may also be done, depending on the suspected cause.  TREATMENT  Viral pharyngitis will usually get better in 3 4 days without the use of medicine. Bacterial pharyngitis is treated with medicines that kill germs (antibiotics).  HOME CARE INSTRUCTIONS   Drink enough water and fluids to keep your urine clear or pale yellow.   Only take over-the-counter or prescription medicines as directed by your health care provider:   If you are prescribed antibiotics, make sure you finish them even if you start to feel better.   Do not take aspirin.   Get lots of rest.   Gargle with 8 oz of salt water ( tsp of salt per 1 qt of water) as often as every 1 2 hours to soothe your throat.   Throat lozenges (  if you are not at risk for choking) or sprays may be used to soothe your throat. SEEK MEDICAL CARE IF:   You have large, tender lumps in your neck.  You have a rash.  You cough up green, yellow-brown, or bloody spit. SEEK IMMEDIATE MEDICAL CARE IF:   Your neck becomes  stiff.  You drool or are unable to swallow liquids.  You vomit or are unable to keep medicines or liquids down.  You have severe pain that does not go away with the use of recommended medicines.  You have trouble breathing (not caused by a stuffy nose). MAKE SURE YOU:   Understand these instructions.  Will watch your condition.  Will get help right away if you are not doing well or get worse. Document Released: 07/29/2005 Document Revised: 05/19/2013 Document Reviewed: 04/05/2013 [facility] Patient Information 2014 [place].

## 2013-11-12 NOTE — ED Notes (Signed)
Patient complains of cough that started yesterday Today states she woke up with sore throat  Having yellow discharge from both eye

## 2013-11-14 LAB — CULTURE, GROUP A STREP

## 2014-06-08 DIAGNOSIS — R109 Unspecified abdominal pain: Secondary | ICD-10-CM | POA: Insufficient documentation

## 2014-06-08 DIAGNOSIS — M545 Low back pain, unspecified: Secondary | ICD-10-CM | POA: Insufficient documentation

## 2014-06-08 DIAGNOSIS — R6889 Other general symptoms and signs: Secondary | ICD-10-CM | POA: Insufficient documentation

## 2014-06-08 DIAGNOSIS — A084 Viral intestinal infection, unspecified: Secondary | ICD-10-CM | POA: Insufficient documentation

## 2014-06-08 DIAGNOSIS — R5383 Other fatigue: Secondary | ICD-10-CM | POA: Insufficient documentation

## 2014-06-08 DIAGNOSIS — J019 Acute sinusitis, unspecified: Secondary | ICD-10-CM | POA: Insufficient documentation

## 2014-07-21 ENCOUNTER — Other Ambulatory Visit: Payer: Self-pay | Admitting: Internal Medicine

## 2014-07-21 ENCOUNTER — Encounter: Payer: Self-pay | Admitting: Internal Medicine

## 2014-07-21 ENCOUNTER — Ambulatory Visit (INDEPENDENT_AMBULATORY_CARE_PROVIDER_SITE_OTHER): Payer: 59 | Admitting: Internal Medicine

## 2014-07-21 VITALS — BP 100/60 | HR 86 | Temp 98.6°F | Resp 16 | Ht 62.0 in | Wt 138.0 lb

## 2014-07-21 DIAGNOSIS — R002 Palpitations: Secondary | ICD-10-CM

## 2014-07-21 DIAGNOSIS — Z139 Encounter for screening, unspecified: Secondary | ICD-10-CM

## 2014-07-21 DIAGNOSIS — J3089 Other allergic rhinitis: Secondary | ICD-10-CM

## 2014-07-21 DIAGNOSIS — J309 Allergic rhinitis, unspecified: Secondary | ICD-10-CM | POA: Insufficient documentation

## 2014-07-21 LAB — COMPREHENSIVE METABOLIC PANEL
ALK PHOS: 52 U/L (ref 39–117)
ALT: 12 U/L (ref 0–35)
AST: 15 U/L (ref 0–37)
Albumin: 3.8 g/dL (ref 3.5–5.2)
BILIRUBIN TOTAL: 0.4 mg/dL (ref 0.2–1.2)
BUN: 8 mg/dL (ref 6–23)
CO2: 26 mEq/L (ref 19–32)
CREATININE: 0.6 mg/dL (ref 0.50–1.10)
Calcium: 8.7 mg/dL (ref 8.4–10.5)
Chloride: 104 mEq/L (ref 96–112)
Glucose, Bld: 76 mg/dL (ref 70–99)
Potassium: 4 mEq/L (ref 3.5–5.3)
SODIUM: 137 meq/L (ref 135–145)
Total Protein: 6.9 g/dL (ref 6.0–8.3)

## 2014-07-21 LAB — CBC WITH DIFFERENTIAL/PLATELET
BASOS PCT: 1 % (ref 0–1)
Basophils Absolute: 0.1 10*3/uL (ref 0.0–0.1)
EOS ABS: 0.2 10*3/uL (ref 0.0–0.7)
Eosinophils Relative: 2 % (ref 0–5)
HEMATOCRIT: 34.2 % — AB (ref 36.0–46.0)
Hemoglobin: 10.8 g/dL — ABNORMAL LOW (ref 12.0–15.0)
Lymphocytes Relative: 25 % (ref 12–46)
Lymphs Abs: 2.1 10*3/uL (ref 0.7–4.0)
MCH: 23.7 pg — ABNORMAL LOW (ref 26.0–34.0)
MCHC: 31.6 g/dL (ref 30.0–36.0)
MCV: 75.2 fL — ABNORMAL LOW (ref 78.0–100.0)
MONO ABS: 0.8 10*3/uL (ref 0.1–1.0)
MONOS PCT: 10 % (ref 3–12)
MPV: 8.8 fL — ABNORMAL LOW (ref 9.4–12.4)
NEUTROS PCT: 62 % (ref 43–77)
Neutro Abs: 5.1 10*3/uL (ref 1.7–7.7)
Platelets: 421 10*3/uL — ABNORMAL HIGH (ref 150–400)
RBC: 4.55 MIL/uL (ref 3.87–5.11)
RDW: 15.4 % (ref 11.5–15.5)
WBC: 8.3 10*3/uL (ref 4.0–10.5)

## 2014-07-21 LAB — LIPID PANEL
CHOL/HDL RATIO: 2.3 ratio
Cholesterol: 156 mg/dL (ref 0–200)
HDL: 68 mg/dL (ref >39)
LDL CALC: 80 mg/dL (ref 0–99)
Triglycerides: 42 mg/dL (ref <150)
VLDL: 8 mg/dL (ref 0–40)

## 2014-07-21 LAB — TSH: TSH: 1.331 u[IU]/mL (ref 0.350–4.500)

## 2014-07-21 NOTE — Patient Instructions (Signed)
Schedule 3D mm  Schedule CPE   See me as needed

## 2014-07-21 NOTE — Progress Notes (Signed)
   Subjective:    Patient ID: Amy Terry, female    DOB: 08/01/1971, 43 y.o.   MRN: [id number]  HPI New pt here for first visit.  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  Has not had any preventive care in 7-8 years.  NO pap since she delivered her 43 yo  Reports palpitations when she drinks coffee at work.  She works night shift and will drink 3 cups of coffee no dizziness no chest pain no syncope  No Known Allergies Past Medical History  Diagnosis Date  . Sinusitis   . Allergy    Past Surgical History  Procedure Laterality Date  . Cesarean section    . Cesarean section    . Tubal ligation     History   Social History  . Marital Status: Married    Spouse Name: N/A    Number of Children: N/A  . Years of Education: N/A   Occupational History  . Not on file.   Social History Main Topics  . Smoking status: Never Smoker   . Smokeless tobacco: Never Used  . Alcohol Use: No  . Drug Use: No  . Sexual Activity:    Partners: Male   Other Topics Concern  . Not on file   Social History Narrative   Family History  Problem Relation Age of Onset  . Hypertension Mother   . Emphysema Mother   . Hypertension Father   . Thyroid disease Sister   . Breast cancer Sister   . Hyperlipidemia Brother   . Thyroid disease Paternal Aunt   . Thyroid disease Sister   . Asthma Sister   . Anemia Sister   . Thyroid disease Sister    Patient Active Problem List   Diagnosis Date Noted  . Palpitations 07/21/2014  . Allergic rhinitis 07/21/2014   Current Outpatient Prescriptions on File Prior to Visit  Medication Sig Dispense Refill  . fluticasone (FLONASE) 50 MCG/ACT nasal spray Place 2 sprays into the nose as needed.       No current facility-administered medications on file prior to visit.       Review of Systems See hPI    Objective:   Physical Exam  Physical Exam  Nursing note and vitals reviewed.  Constitutional:  She is oriented to person, place, and time. She appears well-developed and well-nourished.  HENT:  Head: Normocephalic and atraumatic.  Cardiovascular: Normal rate and regular rhythm. Exam reveals no gallop and no friction rub.  No murmur heard.  Pulmonary/Chest: Breath sounds normal. She has no wheezes. She has no rales.  Neurological: She is alert and oriented to person, place, and time.  Skin: Skin is warm and dry.  Psychiatric: She has a normal mood and affect. Her behavior is normal.             Assessment & Plan:  Palpitations  EKG  NSR nonspecific st t changes  Allergic rhintiis  OK to take claritin D with nasonex OTC     Schedule CPE, 3D mm and will get labs today

## 2014-07-22 ENCOUNTER — Encounter: Payer: Self-pay | Admitting: *Deleted

## 2014-07-22 LAB — VITAMIN D 25 HYDROXY (VIT D DEFICIENCY, FRACTURES): Vit D, 25-Hydroxy: 22 ng/mL — ABNORMAL LOW (ref 30–100)

## 2014-07-25 ENCOUNTER — Telehealth: Payer: Self-pay | Admitting: Internal Medicine

## 2014-07-25 LAB — FERRITIN: Ferritin: 6 ng/mL — ABNORMAL LOW (ref 10–291)

## 2014-07-25 LAB — IRON AND TIBC
%SAT: 8 % — ABNORMAL LOW (ref 20–55)
Iron: 42 ug/dL (ref 42–145)
TIBC: 503 ug/dL — ABNORMAL HIGH (ref 250–470)
UIBC: 461 ug/dL — ABNORMAL HIGH (ref 125–400)

## 2014-07-25 LAB — FOLATE: FOLATE: 17.4 ng/mL

## 2014-07-25 LAB — VITAMIN B12: VITAMIN B 12: 773 pg/mL (ref 211–911)

## 2014-07-25 NOTE — Telephone Encounter (Signed)
Left message on voicemail to call regarding labs

## 2014-07-25 NOTE — Progress Notes (Signed)
Added anemia  profile- eh

## 2014-07-26 ENCOUNTER — Telehealth: Payer: Self-pay | Admitting: Internal Medicine

## 2014-07-26 DIAGNOSIS — D509 Iron deficiency anemia, unspecified: Secondary | ICD-10-CM | POA: Insufficient documentation

## 2014-07-26 MED ORDER — INTEGRA 62.5-62.5-40-3 MG PO CAPS: ORAL_CAPSULE | ORAL | Status: DC

## 2014-07-26 NOTE — Telephone Encounter (Signed)
Spoke with pt and informed of labs  She does have heavy menses  seh has iron deficiency anemia   Will start oral FE and wil refer to gyn fro eval

## 2014-08-17 ENCOUNTER — Ambulatory Visit: Payer: 59

## 2014-08-30 ENCOUNTER — Ambulatory Visit: Payer: 59

## 2014-10-21 ENCOUNTER — Other Ambulatory Visit: Payer: Self-pay

## 2014-10-21 ENCOUNTER — Ambulatory Visit: Admission: RE | Admit: 2014-10-21 | Payer: 59 | Source: Ambulatory Visit

## 2014-10-21 ENCOUNTER — Ambulatory Visit
Admission: RE | Admit: 2014-10-21 | Discharge: 2014-10-21 | Disposition: A | Discharge status: Home / Self Care | Payer: 59 | Source: Ambulatory Visit

## 2014-10-21 ENCOUNTER — Encounter (INDEPENDENT_AMBULATORY_CARE_PROVIDER_SITE_OTHER): Payer: Self-pay

## 2014-10-21 DIAGNOSIS — Z1231 Encounter for screening mammogram for malignant neoplasm of breast: Secondary | ICD-10-CM

## 2014-10-26 ENCOUNTER — Ambulatory Visit (INDEPENDENT_AMBULATORY_CARE_PROVIDER_SITE_OTHER): Payer: 59 | Admitting: Internal Medicine

## 2014-10-26 ENCOUNTER — Encounter: Payer: Self-pay | Admitting: Internal Medicine

## 2014-10-26 VITALS — BP 112/78 | HR 84 | Resp 16 | Ht 61.5 in | Wt 142.0 lb

## 2014-10-26 DIAGNOSIS — Z124 Encounter for screening for malignant neoplasm of cervix: Secondary | ICD-10-CM

## 2014-10-26 DIAGNOSIS — D500 Iron deficiency anemia secondary to blood loss (chronic): Secondary | ICD-10-CM

## 2014-10-26 DIAGNOSIS — Z0000 Encounter for general adult medical examination without abnormal findings: Secondary | ICD-10-CM

## 2014-10-26 DIAGNOSIS — R002 Palpitations: Secondary | ICD-10-CM

## 2014-10-26 DIAGNOSIS — Z0001 Encounter for general adult medical examination with abnormal findings: Secondary | ICD-10-CM

## 2014-10-26 DIAGNOSIS — Z1151 Encounter for screening for human papillomavirus (HPV): Secondary | ICD-10-CM

## 2014-10-26 LAB — CBC WITH DIFFERENTIAL/PLATELET
BASOS PCT: 1 % (ref 0–1)
Basophils Absolute: 0.1 10*3/uL (ref 0.0–0.1)
EOS PCT: 3 % (ref 0–5)
Eosinophils Absolute: 0.2 10*3/uL (ref 0.0–0.7)
HEMATOCRIT: 38.5 % (ref 36.0–46.0)
Hemoglobin: 12.3 g/dL (ref 12.0–15.0)
LYMPHS PCT: 38 % (ref 12–46)
Lymphs Abs: 2.4 10*3/uL (ref 0.7–4.0)
MCH: 27.3 pg (ref 26.0–34.0)
MCHC: 31.9 g/dL (ref 30.0–36.0)
MCV: 85.4 fL (ref 78.0–100.0)
MONO ABS: 0.6 10*3/uL (ref 0.1–1.0)
MPV: 9.4 fL (ref 8.6–12.4)
Monocytes Relative: 9 % (ref 3–12)
Neutro Abs: 3.1 10*3/uL (ref 1.7–7.7)
Neutrophils Relative %: 49 % (ref 43–77)
PLATELETS: 311 10*3/uL (ref 150–400)
RBC: 4.51 MIL/uL (ref 3.87–5.11)
RDW: 15.5 % (ref 11.5–15.5)
WBC: 6.4 10*3/uL (ref 4.0–10.5)

## 2014-10-26 LAB — POCT URINALYSIS DIPSTICK
Bilirubin, UA: NEGATIVE
GLUCOSE UA: NEGATIVE
Ketones, UA: NEGATIVE
Leukocytes, UA: NEGATIVE
NITRITE UA: NEGATIVE
Protein, UA: NEGATIVE
RBC UA: NEGATIVE
Spec Grav, UA: 1.01
UROBILINOGEN UA: NEGATIVE
pH, UA: 6.5

## 2014-10-26 NOTE — Progress Notes (Signed)
Subjective:    Patient ID: Amy Terry, female    DOB: 1971-02-08, 44 y.o.   MRN: [id number]  HPI  07/2014 note Assessment & Plan:  Palpitations EKG NSR nonspecific st t changes  Allergic rhintiis OK to take claritin D with nasonex OTC   Schedule CPE, 3D mm and will get labs today        TODAY  Amy Terry is here for CPE  HM: needs pap today she is a non-smoker  Mm neg 10/2014 Reports nearly daily palpitations and dizziness when bending forward.    No LOC   Rare caffiene use  No chest pain or dypnea when having palpitations.  Duration approx 20 mins   Anemia mild .  She does report heavy menses with clots.  On menses today.  Taking FE  .  Mild anemia does not explain  Palpitations.  She does have upcoming appt with Dr. Cletis Terry   No Known Allergies Past Medical History  Diagnosis Date  . Sinusitis   . Allergy    Past Surgical History  Procedure Laterality Date  . Cesarean section    . Cesarean section    . Tubal ligation     History   Social History  . Marital Status: Married    Spouse Name: N/A  . Number of Children: N/A  . Years of Education: N/A   Occupational History  . Not on file.   Social History Main Topics  . Smoking status: Never Smoker   . Smokeless tobacco: Never Used  . Alcohol Use: No  . Drug Use: No  . Sexual Activity:    Partners: Male   Other Topics Concern  . Not on file   Social History Narrative   Family History  Problem Relation Age of Onset  . Hypertension Mother   . Emphysema Mother   . Hypertension Father   . Thyroid disease Sister   . Breast cancer Sister   . Hyperlipidemia Brother   . Thyroid disease Paternal Aunt   . Thyroid disease Sister   . Asthma Sister   . Anemia Sister   . Thyroid disease Sister    Patient Active Problem List   Diagnosis Date Noted  . Iron deficiency anemia 07/26/2014  . Palpitations 07/21/2014  . Allergic rhinitis 07/21/2014   Current Outpatient Prescriptions on File Prior to  Visit  Medication Sig Dispense Refill  . Fe Fum-FePoly-Vit C-Vit B3 (INTEGRA) 62.5-62.5-40-3 MG CAPS Take one daily 30 capsule 3  . fluticasone (FLONASE) 50 MCG/ACT nasal spray Place 2 sprays into the nose as needed.      . loratadine-pseudoephedrine (CLARITIN-D 24-HOUR) 10-240 MG per 24 hr tablet Take 1 tablet by mouth daily.     No current facility-administered medications on file prior to visit.      Review of Systems  Respiratory: Negative for cough, chest tightness, shortness of breath and wheezing.   Cardiovascular: Negative for chest pain, palpitations and leg swelling.  All other systems reviewed and are negative.      Objective:   Physical Exam Physical Exam  Vital signs and nursing note reviewed  Constitutional: She is oriented to person, place, and time. She appears well-developed and well-nourished. She is cooperative.  HENT:  Head: Normocephalic and atraumatic.  Right Ear: Tympanic membrane normal.  Left Ear: Tympanic membrane normal.  Nose: Nose normal.  Mouth/Throat: Oropharynx is clear and moist and mucous membranes are normal. No oropharyngeal exudate or posterior oropharyngeal erythema.  Eyes: Conjunctivae and EOM are  normal. Pupils are equal, round, and reactive to light.  Neck: Neck supple. No JVD present. Carotid bruit is not present. No mass and no thyromegaly present.  Cardiovascular: Regular rhythm, normal heart sounds, intact distal pulses and normal pulses.  Exam reveals no gallop and no friction rub.   No murmur heard. Pulses:      Dorsalis pedis pulses are 2+ on the right side, and 2+ on the left side.  Pulmonary/Chest: Breath sounds normal. She has no wheezes. She has no rhonchi. She has no rales. Right breast exhibits no mass, no nipple discharge and no skin change. Left breast exhibits no mass, no nipple discharge and no skin change.  Abdominal: Soft. Bowel sounds are normal. She exhibits no distension and no mass. There is no hepatosplenomegaly.  There is no tenderness. There is no CVA tenderness.  Genitourinary: Rectum normal, vagina normal and uterus normal. Rectal exam shows no mass.  No labial fusion. There is no lesion on the right labia. There is no lesion on the left labia. Cervix exhibits no motion tenderness. Right adnexum displays no mass, no tenderness and no fullness. Left adnexum displays no mass, no tenderness and no fullness. No erythema around the vagina.  Musculoskeletal:       No active synovitis to any joint.    Lymphadenopathy:       Right cervical: No superficial cervical adenopathy present.      Left cervical: No superficial cervical adenopathy present.       Right axillary: No pectoral and no lateral adenopathy present.       Left axillary: No pectoral and no lateral adenopathy present.      Right: No inguinal adenopathy present.       Left: No inguinal adenopathy present.  Neurological: She is alert and oriented to person, place, and time. She has normal strength and normal reflexes. No cranial nerve deficit or sensory deficit. She displays a negative Romberg sign. Coordination and gait normal.  Skin: Skin is warm and dry. No abrasion, no bruising, no ecchymosis and no rash noted. No cyanosis. Nails show no clubbing.  Psychiatric: She has a normal mood and affect. Her speech is normal and behavior is normal.          Assessment & Plan:   HM  Pap today Non smoker  Mm UTD  Palpitations advised to avoid caffiene.  Due to frequency ok to see cardiology for consideration of HOlter.   Mild anemia does not explain,  TSH normal  rehceck CBC today  Anemia heavy menses  Keep appt with GYN   Dr. Cletis Terry      Assessment & Plan:

## 2014-10-27 LAB — CYTOLOGY - PAP

## 2014-10-30 NOTE — Progress Notes (Signed)
Patient ID: Amy Terry, female   DOB: [birth year], 44 y.o.   MRN: [id number]     Cardiology Office Note   Date:  10/31/2014   ID:  Amy Terry, DOB 10/12/1970, MRN [id number]  PCP:  Kelton Pillar, MD  Cardiologist:   [name], MD   Chief Complaint  Patient presents with  . Palpitations    NP      History of Present Illness: Amy Terry is a 44 y.o. female who presents for  Palpitations and dizzyness when leaning forward She has had this over the last year.  Palpitations 1-2x/week  Flip flops No prolonged episodes  Occur at home or work  Works nights on 6[employer]  Some stress.  No excess ETOH/ caffeine or stimulants.  Strong family history of thyroid issues in sisters No syncope or chest pain.  Use to due Zumba but when last tried 3 months ago felt dyspnea and light headedness  Dizzy symptoms due seem to be with change in position     Past Medical History  Diagnosis Date  . Sinusitis   . Allergy     Past Surgical History  Procedure Laterality Date  . Cesarean section    . Cesarean section    . Tubal ligation       Current Outpatient Prescriptions  Medication Sig Dispense Refill  . Fe Fum-FePoly-Vit C-Vit B3 (INTEGRA) 62.5-62.5-40-3 MG CAPS Take one daily 30 capsule 3  . fluticasone (FLONASE) 50 MCG/ACT nasal spray Place 2 sprays into the nose as needed.      . Loratadine (CLARITIN PO) Take 1 tablet by mouth daily.    . Vitamin D, Ergocalciferol, (DRISDOL) 50000 UNITS CAPS capsule Take 50,000 Units by mouth every 7 (seven) days.     No current facility-administered medications for this visit.    Allergies:   Review of patient's allergies indicates no known allergies.    Social History:  The patient  reports that she has never smoked. She has never used smokeless tobacco. She reports that she does not drink alcohol or use illicit drugs.   Family History:  The patient's family history includes Anemia in her sister; Asthma in her sister; Breast  cancer in her sister; Emphysema in her mother; Hyperlipidemia in her brother; Hypertension in her father and mother; Thyroid disease in her paternal aunt, sister, sister, and sister.    ROS:  Please see the history of present illness.   Otherwise, review of systems are positive for none.   All other systems are reviewed and negative.    PHYSICAL EXAM: VS:  BP 110/64 mmHg  Pulse 87  Ht 5\' 2"  (1.575 m)  Wt 143 lb 12.8 oz (65.227 kg)  BMI 26.29 kg/m2  SpO2 99%  LMP 10/09/2014 , BMI Body mass index is 26.29 kg/(m^2). GEN: Well nourished, well developed, in no acute distress HEENT: normal Neck: no JVD, carotid bruits, or masses Cardiac:  RRR; SEM accentuates with valsalva no , rubs, or gallops,no edema  Respiratory:  clear to auscultation bilaterally, normal work of breathing GI: soft, nontender, nondistended, + BS MS: no deformity or atrophy Skin: warm and dry, no rash Neuro:  Strength and sensation are intact Psych: euthymic mood, full affect   EKG:   07/21/14   SR rate 82 normal ECG    Recent Labs: 07/21/2014: ALT 12; BUN 8; Creatinine 0.60; Potassium 4.0; Sodium 137; TSH 1.331 10/26/2014: Hemoglobin 12.3; Platelets 311    Lipid Panel    Component Value Date/Time  CHOL 156 07/21/2014 1343   TRIG 42 07/21/2014 1343   HDL 68 07/21/2014 1343   CHOLHDL 2.3 07/21/2014 1343   VLDL 8 07/21/2014 1343   LDLCALC 80 07/21/2014 1343      Wt Readings from Last 3 Encounters:  10/31/14 143 lb 12.8 oz (65.227 kg)  10/26/14 142 lb (64.411 kg)  07/21/14 138 lb (62.596 kg)      Other studies Reviewed: Additional studies/ records that were reviewed today include: Epic records.    ASSESSMENT AND PLAN:  1.  Palpitations- etiology unclear  Check TSH/T4 given family history of thyroid issues in sisters.  Event monitor PRN inderal  Echo to r/o HOCM 2. Murmur  Suggestive of HOCM with accentuation with valsalva  Echo   Current medicines are reviewed at length with the patient  today.  The patient does not have concerns regarding medicines.  The following changes have been made:  Inderal 10 mg PRN  Labs/ tests ordered today include:  TSH/T4, Echo Event monitor   No orders of the defined types were placed in this encounter.     Disposition:   FU with after testing next available     Signed, [name], MD  10/31/2014 10:20 AM    [facility] [place], [place], [place]  [address] Phone: [phone]; Fax: [phone]

## 2014-10-31 ENCOUNTER — Encounter: Payer: Self-pay | Admitting: Cardiovascular Disease

## 2014-10-31 ENCOUNTER — Ambulatory Visit (INDEPENDENT_AMBULATORY_CARE_PROVIDER_SITE_OTHER): Payer: 59 | Admitting: Cardiovascular Disease

## 2014-10-31 VITALS — BP 110/64 | HR 87 | Ht 62.0 in | Wt 143.8 lb

## 2014-10-31 DIAGNOSIS — R011 Cardiac murmur, unspecified: Secondary | ICD-10-CM

## 2014-10-31 DIAGNOSIS — R002 Palpitations: Secondary | ICD-10-CM

## 2014-10-31 LAB — T4, FREE: Free T4: 0.81 ng/dL (ref 0.60–1.60)

## 2014-10-31 LAB — TSH: TSH: 0.59 u[IU]/mL (ref 0.35–4.50)

## 2014-10-31 MED ORDER — PROPRANOLOL HCL 10 MG PO TABS: 10.0000 mg | ORAL_TABLET | Freq: Every day | ORAL | Status: DC | PRN

## 2014-10-31 NOTE — Patient Instructions (Signed)
Your physician recommends that you schedule a follow-up appointment in: NEXT AVAILABLE WITH  DR [facility]   Your physician has recommended you make the following change in your medication:  MAY  TAKE PROPANOLOL 10 MG  AS NEEDED  DAILY   Your physician recommends that you return for lab work in: TODAY   TSH   T4  Your physician has requested that you have an echocardiogram. Echocardiography is a painless test that uses sound waves to create images of your heart. It provides your doctor with information about the size and shape of your heart and how well your heart's chambers and valves are working. This procedure takes approximately one hour. There are no restrictions for this procedure.    Your physician has recommended that you wear an event monitor. Event monitors are medical devices that record the heart's electrical activity. Doctors most often [place] these monitors to diagnose arrhythmias. Arrhythmias are problems with the speed or rhythm of the heartbeat. The monitor is a small, portable device. You can wear one while you do your normal daily activities. This is usually used to diagnose what is causing palpitations/syncope (passing out).

## 2014-11-04 ENCOUNTER — Ambulatory Visit (HOSPITAL_COMMUNITY): Payer: 59 | Attending: Cardiology

## 2014-11-04 ENCOUNTER — Encounter (INDEPENDENT_AMBULATORY_CARE_PROVIDER_SITE_OTHER): Payer: 59

## 2014-11-04 ENCOUNTER — Encounter: Payer: Self-pay | Admitting: Radiology

## 2014-11-04 DIAGNOSIS — R002 Palpitations: Secondary | ICD-10-CM | POA: Diagnosis not present

## 2014-11-04 DIAGNOSIS — R011 Cardiac murmur, unspecified: Secondary | ICD-10-CM | POA: Insufficient documentation

## 2014-11-04 NOTE — Progress Notes (Signed)
2D Echo completed. 11/04/2014 

## 2014-11-04 NOTE — Progress Notes (Signed)
Patient ID: Amy Terry, female   DOB: 05/07/1971, 44 y.o.   MRN: [id number] Lifewatch 30 day monitor applied. EOS 12-04-14

## 2014-11-07 ENCOUNTER — Telehealth: Payer: Self-pay | Admitting: Internal Medicine

## 2014-11-07 NOTE — Telephone Encounter (Signed)
Left message on mobile and home phone to call regarding pap results  Will also fax pap to Dr. Cletis Media pt s GYN

## 2014-11-08 ENCOUNTER — Telehealth: Payer: Self-pay | Admitting: Internal Medicine

## 2014-11-08 NOTE — Telephone Encounter (Signed)
Spoke with pt and informed of HPV pos pap .  She has upcoming appt with Dr. Cletis Media  Results faxed to her office

## 2014-11-16 ENCOUNTER — Encounter: Payer: Self-pay | Admitting: *Deleted

## 2014-11-17 ENCOUNTER — Telehealth: Payer: Self-pay | Admitting: Cardiovascular Disease

## 2014-11-17 NOTE — Telephone Encounter (Signed)
New message      Want echo results.  If possible, please call before 2:30.  Pt work nights and she will be sleep

## 2014-11-18 NOTE — Telephone Encounter (Signed)
LMTCB ./CY 

## 2014-11-22 NOTE — Telephone Encounter (Signed)
RESULTS LETTER  MAILED  TO PT  WILL AWAIT   RETURN CALL ./CY

## 2014-11-24 ENCOUNTER — Telehealth: Payer: Self-pay | Admitting: Cardiovascular Disease

## 2014-11-24 NOTE — Telephone Encounter (Signed)
New message       Returning a nurses call to get lab results and echo information.  Please call before 10:45 or pt will not be available again

## 2014-11-24 NOTE — Telephone Encounter (Signed)
UNABLE  TO  PHONE PT  AT  DESIGNATED  TIME  AS  WAS IN  CLINIC  AT THAT  TIME  .Adonis Housekeeper

## 2014-11-28 ENCOUNTER — Encounter: Payer: Self-pay | Admitting: *Deleted

## 2014-11-28 NOTE — Progress Notes (Signed)
Patient ID: Amy Terry, female   DOB: [birth year], 44 y.o.   MRN: [id number]     Cardiology Office Note   Date:  11/30/2014   ID:  Amy Terry, DOB [birth year], MRN [id number]  PCP:  Kelton Pillar, MD  Cardiologist:   [name], MD   No chief complaint on file.     History of Present Illness: Amy Terry is a 44 y.o. female who presents for  Palpitations and dizzyness when leaning forward She has had this over the last year.  Palpitations 1-2x/week  Flip flops No prolonged episodes  Occur at home or work  Works nights on 6[employer]  Some stress.  No excess ETOH/ caffeine or stimulants.  Strong family history of thyroid issues in sisters No syncope or chest pain.  Use to due Zumba but when last tried 3 months ago felt dyspnea and light headedness  Dizzy symptoms due seem to be with change in position  Labs reviewed TSH and Hct normal   F/U echo reviewed  Mean gradient 15 mmHg peak 26 mmgh Bicuspid valve likely aortic root mildly dilated but measurement indicates 34 mm  Impressions:  3.25.16    - Normal LV size and systolic function, EF 32-35%. Normal diastolic function. Normal RV size and systolic function. The aortic valve is abnormal, suspect bicuspid, but cannot visualize the leaflets adequately enough on this study to be definitive. There is mild aortic stenosis. The ascending aorta is very mildly dilated.  Event monitor:   Reviewed from  April  Just NSR no arrhythmia  (over 50 pages)   Discussed diagnosis of bicuspid AV and association with aortopathy and need for serial f/u and r/o aneurysms.     Past Medical History  Diagnosis Date  . Sinusitis   . Allergy     Past Surgical History  Procedure Laterality Date  . Cesarean section    . Cesarean section    . Tubal ligation       Current Outpatient Prescriptions  Medication Sig Dispense Refill  . Fe Fum-FePoly-Vit C-Vit B3 (INTEGRA) 62.5-62.5-40-3 MG CAPS Take one daily 30 capsule 3  .  fluticasone (FLONASE) 50 MCG/ACT nasal spray Place 2 sprays into the nose as needed.      . Loratadine (CLARITIN PO) Take 1 tablet by mouth as needed (FOR ALLERGIES).     Marland Kitchen propranolol (INDERAL) 10 MG tablet Take 1 tablet (10 mg total) by mouth daily as needed. (Patient not taking: Reported on 11/30/2014) 30 tablet 3   No current facility-administered medications for this visit.    Allergies:   Review of patient's allergies indicates no known allergies.    Social History:  The patient  reports that she has never smoked. She has never used smokeless tobacco. She reports that she does not drink alcohol or use illicit drugs.   Family History:  The patient's family history includes Anemia in her sister; Asthma in her sister; Breast cancer in her sister; Emphysema in her mother; Hyperlipidemia in her brother; Hypertension in her father and mother; Thyroid disease in her paternal aunt, sister, sister, and sister.    ROS:  Please see the history of present illness.   Otherwise, review of systems are positive for painful urination    All other systems are reviewed and negative.     PHYSICAL EXAM: VS:  BP 92/66 mmHg  Pulse 79  Ht 5\' 2"  (1.575 m)  Wt 145 lb (65.772 kg)  BMI 26.51 kg/m2 , BMI Body mass index  is 26.51 kg/(m^2). GEN: Well nourished, well developed, in no acute distress HEENT: normal Neck: no JVD, carotid bruits, or masses Cardiac:  RRR; SEM accentuates with valsalva no , rubs, or gallops,no edema  Respiratory:  clear to auscultation bilaterally, normal work of breathing GI: soft, nontender, nondistended, + BS MS: no deformity or atrophy Skin: warm and dry, no rash Neuro:  Strength and sensation are intact Psych: euthymic mood, full affect   EKG:   07/21/14   SR rate 82 normal ECG    Recent Labs: 07/21/2014: ALT 12; BUN 8; Creatinine 0.60; Potassium 4.0; Sodium 137 10/26/2014: Hemoglobin 12.3; Platelets 311 10/31/2014: TSH 0.59    Lipid Panel    Component Value  Date/Time   CHOL 156 07/21/2014 1343   TRIG 42 07/21/2014 1343   HDL 68 07/21/2014 1343   CHOLHDL 2.3 07/21/2014 1343   VLDL 8 07/21/2014 1343   LDLCALC 80 07/21/2014 1343      Wt Readings from Last 3 Encounters:  11/30/14 145 lb (65.772 kg)  10/31/14 143 lb 12.8 oz (65.227 kg)  10/26/14 142 lb (64.411 kg)      Other studies Reviewed: Additional studies/ records that were reviewed today include: Epic records.    ASSESSMENT AND PLAN:  1.  Palpitations- benign nothing on event monitor normal ECG and EF by echo Hct/TSH normal PRN inderal 2. Murmur  Possible bicuspid AV  Needs cardiac MRI to further assess morphology and check entire aorta for aneurysm and coarctation Also needs MRA neck to r/o cerebral aneurysm associated with bicuspid valves 2. Painful urination. Indicates UA from primary a week ago ok will repeat and forward  No fever or flank pain    Current medicines are reviewed at length with the patient today.  The patient does not have concerns regarding medicines.  The following changes have been made:  Inderal 10 mg PRN  Labs/ tests ordered today include:  TSH/T4, Echo Event monitor    Orders Placed This Encounter  Procedures  . MR Card Morphology Wo/W Cm  . MR Angiogram Chest W Wo Contrast  . CT Angio Head W/Cm &/Or Wo Cm  . CT Angio Neck W/Cm &/Or Wo/Cm  . Basic metabolic panel  . Urinalysis     Disposition:   FU with me in a year     Signed, [name], MD  11/30/2014 9:54 AM    [facility] [place], [place], [place]  [address] Phone: [phone]; Fax: [phone]

## 2014-11-30 ENCOUNTER — Encounter: Payer: Self-pay | Admitting: Cardiovascular Disease

## 2014-11-30 ENCOUNTER — Ambulatory Visit (INDEPENDENT_AMBULATORY_CARE_PROVIDER_SITE_OTHER): Payer: 59 | Admitting: Cardiovascular Disease

## 2014-11-30 ENCOUNTER — Telehealth: Payer: Self-pay | Admitting: Internal Medicine

## 2014-11-30 VITALS — BP 92/66 | HR 79 | Ht 62.0 in | Wt 145.0 lb

## 2014-11-30 DIAGNOSIS — R309 Painful micturition, unspecified: Secondary | ICD-10-CM | POA: Diagnosis not present

## 2014-11-30 DIAGNOSIS — R350 Frequency of micturition: Secondary | ICD-10-CM | POA: Diagnosis not present

## 2014-11-30 DIAGNOSIS — Q231 Congenital insufficiency of aortic valve: Secondary | ICD-10-CM | POA: Diagnosis not present

## 2014-11-30 DIAGNOSIS — Z01812 Encounter for preprocedural laboratory examination: Secondary | ICD-10-CM

## 2014-11-30 LAB — BASIC METABOLIC PANEL
BUN: 8 mg/dL (ref 6–23)
CALCIUM: 9 mg/dL (ref 8.4–10.5)
CO2: 29 meq/L (ref 19–32)
CREATININE: 0.57 mg/dL (ref 0.40–1.20)
Chloride: 105 mEq/L (ref 96–112)
GFR: 122.41 mL/min (ref >60.00)
GLUCOSE: 78 mg/dL (ref 70–99)
Potassium: 4 mEq/L (ref 3.5–5.1)
Sodium: 137 mEq/L (ref 135–145)

## 2014-11-30 LAB — URINALYSIS, ROUTINE W REFLEX MICROSCOPIC
BILIRUBIN URINE: NEGATIVE
Ketones, ur: NEGATIVE
NITRITE: NEGATIVE
Specific Gravity, Urine: 1.01 (ref 1.000–1.030)
Total Protein, Urine: NEGATIVE
URINE GLUCOSE: NEGATIVE
UROBILINOGEN UA: 0.2 (ref 0.0–1.0)
pH: 6 (ref 5.0–8.0)

## 2014-11-30 NOTE — Patient Instructions (Addendum)
Medication Instructions:   NO CHANGES  Labwork: U/A  AND  BMET   TODAY   Testing/Procedures: CARDIAC  MRI AND  MRA  MRA  OF  HEAD AND  NECK     Follow-Up: Your physician wants you to follow-up in: [place] will receive a reminder letter in the mail two months in advance. If you don't receive a letter, please call our office to schedule the follow-up appointment.  Any Other Special Instructions Will Be Listed Below (If Applicable).

## 2014-11-30 NOTE — Telephone Encounter (Signed)
Amy Terry   Call pt and have her come in for a U/A and send for culture.  Was at cardiologist office and has slighlty abnormal urine.  Send for culture and let her know when it comes back next week we can see if any bacterial infection is present   thanks

## 2014-11-30 NOTE — Telephone Encounter (Signed)
PT  HAS  9:30  AM APPT  TODAY ./CY

## 2014-12-02 ENCOUNTER — Encounter: Payer: Self-pay | Admitting: Cardiovascular Disease

## 2014-12-05 NOTE — Progress Notes (Signed)
It has been done--see note below.  Amy Terry contacted the patient 11/30/14 about the results.  Notes Recorded by Josue Hector, MD on 11/30/2014 at 5:02 PM Rate bacteria and trace WBC not normally Rx will forward to primary

## 2014-12-05 NOTE — Telephone Encounter (Signed)
Amy Terry will stop by tomorrow and leave a urine sample-eh

## 2014-12-06 ENCOUNTER — Other Ambulatory Visit (INDEPENDENT_AMBULATORY_CARE_PROVIDER_SITE_OTHER): Payer: 59

## 2014-12-06 DIAGNOSIS — R319 Hematuria, unspecified: Secondary | ICD-10-CM | POA: Diagnosis not present

## 2014-12-06 LAB — POCT URINALYSIS DIPSTICK
Bilirubin, UA: NEGATIVE
Blood, UA: NEGATIVE
Glucose, UA: NEGATIVE
Ketones, UA: NEGATIVE
LEUKOCYTES UA: NEGATIVE
NITRITE UA: NEGATIVE
Protein, UA: NEGATIVE
Spec Grav, UA: 1.01
UROBILINOGEN UA: NEGATIVE
pH, UA: 6.5

## 2014-12-08 ENCOUNTER — Telehealth: Payer: Self-pay | Admitting: *Deleted

## 2014-12-08 LAB — CULTURE, URINE COMPREHENSIVE
COLONY COUNT: NO GROWTH
Organism ID, Bacteria: NO GROWTH

## 2014-12-08 NOTE — Telephone Encounter (Signed)
-----   Message from Lanice Shirts, MD sent at 12/08/2014 10:05 AM EDT ----- Call pt and let her know that her urine culture had no growth

## 2014-12-08 NOTE — Telephone Encounter (Signed)
Pt aware of urine culture-eh

## 2014-12-14 ENCOUNTER — Encounter: Payer: 59 | Admitting: Internal Medicine

## 2014-12-16 ENCOUNTER — Ambulatory Visit (HOSPITAL_COMMUNITY): Payer: 59

## 2014-12-16 ENCOUNTER — Ambulatory Visit (HOSPITAL_COMMUNITY)
Admission: RE | Admit: 2014-12-16 | Discharge: 2014-12-16 | Disposition: A | Discharge status: Home / Self Care | Payer: 59 | Source: Ambulatory Visit | Attending: Cardiovascular Disease | Admitting: Cardiovascular Disease

## 2014-12-16 DIAGNOSIS — R42 Dizziness and giddiness: Secondary | ICD-10-CM | POA: Insufficient documentation

## 2014-12-16 DIAGNOSIS — R002 Palpitations: Secondary | ICD-10-CM | POA: Insufficient documentation

## 2014-12-16 DIAGNOSIS — Q231 Congenital insufficiency of aortic valve: Secondary | ICD-10-CM

## 2014-12-16 MED ORDER — GADOBENATE DIMEGLUMINE 529 MG/ML IV SOLN
15.0000 mL | Freq: Once | INTRAVENOUS | Status: AC | PRN
Start: 2014-12-16 — End: 2014-12-16
  Administered 2014-12-16: 15 mL via INTRAVENOUS

## 2014-12-20 ENCOUNTER — Encounter: Payer: Self-pay | Admitting: *Deleted

## 2014-12-21 ENCOUNTER — Ambulatory Visit (HOSPITAL_COMMUNITY): Payer: 59

## 2014-12-21 ENCOUNTER — Ambulatory Visit (HOSPITAL_COMMUNITY)
Admission: RE | Admit: 2014-12-21 | Discharge: 2014-12-21 | Disposition: A | Discharge status: Home / Self Care | Payer: 59 | Source: Ambulatory Visit | Attending: Cardiovascular Disease | Admitting: Cardiovascular Disease

## 2014-12-21 ENCOUNTER — Encounter: Payer: Self-pay | Admitting: *Deleted

## 2014-12-21 DIAGNOSIS — Q231 Congenital insufficiency of aortic valve: Secondary | ICD-10-CM | POA: Insufficient documentation

## 2014-12-21 DIAGNOSIS — I350 Nonrheumatic aortic (valve) stenosis: Secondary | ICD-10-CM | POA: Diagnosis not present

## 2014-12-21 DIAGNOSIS — I77810 Thoracic aortic ectasia: Secondary | ICD-10-CM | POA: Insufficient documentation

## 2014-12-21 MED ORDER — GADOBENATE DIMEGLUMINE 529 MG/ML IV SOLN
22.0000 mL | Freq: Once | INTRAVENOUS | Status: AC | PRN
  Administered 2014-12-21: 22 mL via INTRAVENOUS

## 2015-02-19 ENCOUNTER — Emergency Department (HOSPITAL_COMMUNITY)
Admission: EM | Admit: 2015-02-19 | Discharge: 2015-02-19 | Disposition: A | Discharge status: Home / Self Care | Payer: 59 | Source: Home / Self Care | Attending: Emergency Medicine | Admitting: Emergency Medicine

## 2015-02-19 ENCOUNTER — Encounter (HOSPITAL_COMMUNITY): Payer: Self-pay | Admitting: Emergency Medicine

## 2015-02-19 DIAGNOSIS — R300 Dysuria: Secondary | ICD-10-CM | POA: Diagnosis not present

## 2015-02-19 LAB — POCT URINALYSIS DIP (DEVICE)
BILIRUBIN URINE: NEGATIVE
Glucose, UA: NEGATIVE mg/dL
KETONES UR: NEGATIVE mg/dL
Leukocytes, UA: NEGATIVE
Nitrite: NEGATIVE
Protein, ur: NEGATIVE mg/dL
Specific Gravity, Urine: <=1.005 (ref 1.005–1.030)
Urobilinogen, UA: 0.2 mg/dL (ref 0.0–1.0)
pH: 5.5 (ref 5.0–8.0)

## 2015-02-19 LAB — POCT PREGNANCY, URINE: Preg Test, Ur: NEGATIVE

## 2015-02-19 MED ORDER — CEPHALEXIN 500 MG PO CAPS: 500.0000 mg | ORAL_CAPSULE | Freq: Four times a day (QID) | ORAL | Status: DC

## 2015-02-19 NOTE — Discharge Instructions (Signed)
It sounds like you have a urinary tract infection. Take Keflex one pill 4 times a day for 3 days. Make sure you're drinking plenty of water. Unsweetened cranberry juice can also be beneficial for urinary tract infections. Follow-up as needed.

## 2015-02-19 NOTE — ED Notes (Signed)
C/o uti States she has pressure Denies any discharge, abd pain and odor

## 2015-02-19 NOTE — ED Provider Notes (Signed)
CSN: [id number]     Arrival date & time 02/19/15  1502 History   First MD Initiated Contact with Patient 02/19/15 1556     Chief Complaint  Patient presents with  . Urinary Tract Infection   (Consider location/radiation/quality/duration/timing/severity/associated sxs/prior Treatment) HPI  She is a 44 year old woman here for evaluation of dysuria. She reports a 2 day history of dysuria and urinary urgency. She denies any fevers or chills. No nausea or vomiting. No abdominal pain or flank pain. No vaginal symptoms. She has had a UTI previously and it felt take this.  Past Medical History  Diagnosis Date  . Sinusitis   . Allergy    Past Surgical History  Procedure Laterality Date  . Cesarean section    . Cesarean section    . Tubal ligation     Family History  Problem Relation Age of Onset  . Hypertension Mother   . Emphysema Mother   . Hypertension Father   . Thyroid disease Sister   . Breast cancer Sister   . Hyperlipidemia Brother   . Thyroid disease Paternal Aunt   . Thyroid disease Sister   . Asthma Sister   . Anemia Sister   . Thyroid disease Sister    History  Substance Use Topics  . Smoking status: Never Smoker   . Smokeless tobacco: Never Used  . Alcohol Use: No   OB History    No data available     Review of Systems As in history of present illness Allergies  Review of patient's allergies indicates no known allergies.  Home Medications   Prior to Admission medications   Medication Sig Start Date End Date Taking? Authorizing Provider  cephALEXin (KEFLEX) 500 MG capsule Take 1 capsule (500 mg total) by mouth 4 (four) times daily. 02/19/15   Melony Overly, MD  Fe Fum-FePoly-Vit C-Vit B3 (INTEGRA) 62.5-62.5-40-3 MG CAPS Take one daily 07/26/14   Lanice Shirts, MD  fluticasone (FLONASE) 50 MCG/ACT nasal spray Place 2 sprays into the nose as needed.      Historical Provider, MD  Loratadine (CLARITIN PO) Take 1 tablet by mouth as needed (FOR ALLERGIES).      Historical Provider, MD  propranolol (INDERAL) 10 MG tablet Take 1 tablet (10 mg total) by mouth daily as needed. Patient not taking: Reported on 11/30/2014 10/31/14   Josue Hector, MD   BP 107/77 mmHg  Pulse 106  Temp(Src) 98 F (36.7 C) (Oral)  Resp 12  SpO2 100%  LMP 02/18/2015 Physical Exam  Constitutional: She is oriented to person, place, and time. She appears well-developed and well-nourished.  Cardiovascular:  Mild tachycardia  Pulmonary/Chest: Effort normal.  Abdominal: Soft. She exhibits no distension. There is no tenderness.  No CVA tenderness  Neurological: She is alert and oriented to person, place, and time.    ED Course  Procedures (including critical care time) Labs Review Labs Reviewed  POCT URINALYSIS DIP (DEVICE) - Abnormal; Notable for the following:    Hgb urine dipstick SMALL (*)    All other components within normal limits  URINE CULTURE  POCT PREGNANCY, URINE    Imaging Review No results found.   MDM   1. Dysuria    Urinalysis has some blood, but she states she is on her period. Based on history she has a UTI, will treat with Keflex for 3 days. Urine culture sent. Follow-up as needed.    Melony Overly, MD 02/19/15 [phone]

## 2015-02-20 LAB — URINE CULTURE: Culture: NO GROWTH

## 2015-02-20 NOTE — ED Notes (Signed)
Final report shows no growth

## 2015-10-17 DIAGNOSIS — R87610 Atypical squamous cells of undetermined significance on cytologic smear of cervix (ASC-US): Secondary | ICD-10-CM | POA: Diagnosis not present

## 2015-10-17 DIAGNOSIS — Z124 Encounter for screening for malignant neoplasm of cervix: Secondary | ICD-10-CM | POA: Diagnosis not present

## 2015-10-17 DIAGNOSIS — Z01419 Encounter for gynecological examination (general) (routine) without abnormal findings: Secondary | ICD-10-CM | POA: Diagnosis not present

## 2015-10-17 DIAGNOSIS — Z6824 Body mass index (BMI) 24.0-24.9, adult: Secondary | ICD-10-CM | POA: Diagnosis not present

## 2015-10-17 DIAGNOSIS — M5431 Sciatica, right side: Secondary | ICD-10-CM | POA: Diagnosis not present

## 2015-10-17 DIAGNOSIS — R87619 Unspecified abnormal cytological findings in specimens from cervix uteri: Secondary | ICD-10-CM | POA: Diagnosis not present

## 2015-10-18 DIAGNOSIS — M50322 Other cervical disc degeneration at C5-C6 level: Secondary | ICD-10-CM | POA: Diagnosis not present

## 2015-10-18 DIAGNOSIS — M545 Low back pain: Secondary | ICD-10-CM | POA: Diagnosis not present

## 2015-10-18 DIAGNOSIS — M542 Cervicalgia: Secondary | ICD-10-CM | POA: Diagnosis not present

## 2015-10-18 MED FILL — MELOXICAM 15 MG TABLET: 15 | 30 days supply | Qty: 30 | Fill #0

## 2015-10-30 DIAGNOSIS — M50322 Other cervical disc degeneration at C5-C6 level: Secondary | ICD-10-CM | POA: Diagnosis not present

## 2015-11-06 DIAGNOSIS — M50322 Other cervical disc degeneration at C5-C6 level: Secondary | ICD-10-CM | POA: Diagnosis not present

## 2015-11-08 DIAGNOSIS — M50322 Other cervical disc degeneration at C5-C6 level: Secondary | ICD-10-CM | POA: Diagnosis not present

## 2015-11-27 DIAGNOSIS — M545 Low back pain: Secondary | ICD-10-CM | POA: Diagnosis not present

## 2015-11-27 DIAGNOSIS — M50322 Other cervical disc degeneration at C5-C6 level: Secondary | ICD-10-CM | POA: Diagnosis not present

## 2015-11-27 DIAGNOSIS — M1288 Other specific arthropathies, not elsewhere classified, other specified site: Secondary | ICD-10-CM | POA: Diagnosis not present

## 2015-12-06 ENCOUNTER — Other Ambulatory Visit (HOSPITAL_COMMUNITY): Payer: Self-pay | Admitting: Orthopedic Surgery

## 2015-12-06 DIAGNOSIS — M47899 Other spondylosis, site unspecified: Secondary | ICD-10-CM

## 2015-12-11 ENCOUNTER — Ambulatory Visit (HOSPITAL_COMMUNITY): Admission: RE | Admit: 2015-12-11 | Payer: 59 | Source: Ambulatory Visit

## 2015-12-11 ENCOUNTER — Ambulatory Visit (HOSPITAL_COMMUNITY): Payer: 59

## 2015-12-18 ENCOUNTER — Ambulatory Visit (HOSPITAL_COMMUNITY)
Admission: RE | Admit: 2015-12-18 | Discharge: 2015-12-18 | Disposition: A | Discharge status: Home / Self Care | Payer: 59 | Source: Ambulatory Visit | Attending: Orthopedic Surgery | Admitting: Orthopedic Surgery

## 2015-12-18 DIAGNOSIS — M47816 Spondylosis without myelopathy or radiculopathy, lumbar region: Secondary | ICD-10-CM | POA: Diagnosis not present

## 2015-12-18 DIAGNOSIS — M1288 Other specific arthropathies, not elsewhere classified, other specified site: Secondary | ICD-10-CM | POA: Insufficient documentation

## 2015-12-18 DIAGNOSIS — M47899 Other spondylosis, site unspecified: Secondary | ICD-10-CM

## 2016-01-25 ENCOUNTER — Ambulatory Visit (HOSPITAL_COMMUNITY)
Admission: EM | Admit: 2016-01-25 | Discharge: 2016-01-25 | Disposition: A | Discharge status: Home / Self Care | Payer: 59 | Attending: Emergency Medicine | Admitting: Emergency Medicine

## 2016-01-25 ENCOUNTER — Encounter (HOSPITAL_COMMUNITY): Payer: Self-pay | Admitting: Emergency Medicine

## 2016-01-25 DIAGNOSIS — Z9851 Tubal ligation status: Secondary | ICD-10-CM | POA: Insufficient documentation

## 2016-01-25 DIAGNOSIS — Z8249 Family history of ischemic heart disease and other diseases of the circulatory system: Secondary | ICD-10-CM | POA: Insufficient documentation

## 2016-01-25 DIAGNOSIS — Z8489 Family history of other specified conditions: Secondary | ICD-10-CM | POA: Insufficient documentation

## 2016-01-25 DIAGNOSIS — Z9889 Other specified postprocedural states: Secondary | ICD-10-CM | POA: Insufficient documentation

## 2016-01-25 DIAGNOSIS — N390 Urinary tract infection, site not specified: Secondary | ICD-10-CM | POA: Diagnosis not present

## 2016-01-25 LAB — POCT URINALYSIS DIP (DEVICE)
Bilirubin Urine: NEGATIVE
Glucose, UA: NEGATIVE mg/dL
KETONES UR: NEGATIVE mg/dL
Leukocytes, UA: NEGATIVE
Nitrite: NEGATIVE
Protein, ur: NEGATIVE mg/dL
SPECIFIC GRAVITY, URINE: 1.025 (ref 1.005–1.030)
Urobilinogen, UA: 0.2 mg/dL (ref 0.0–1.0)
pH: 6 (ref 5.0–8.0)

## 2016-01-25 MED ORDER — CEPHALEXIN 500 MG PO CAPS: 500.0000 mg | ORAL_CAPSULE | Freq: Four times a day (QID) | ORAL | Status: DC

## 2016-01-25 MED FILL — CEPHALEXIN 500 MG CAPSULE: 500 | 3 days supply | Qty: 12 | Fill #0

## 2016-01-25 NOTE — ED Notes (Signed)
PT reports painful urination. This is the third day. PT has had a UTI in the past.

## 2016-01-25 NOTE — Discharge Instructions (Signed)
You have a UTI. Take the Keflex as prescribed. Make sure you are drinking plenty of water. Follow-up if symptoms worsen or do not improve in 48 hours.

## 2016-01-25 NOTE — ED Provider Notes (Signed)
CSN: [id number]     Arrival date & time 01/25/16  1328 History   First MD Initiated Contact with Patient 01/25/16 1352     Chief Complaint  Patient presents with  . Urinary Tract Infection   (Consider location/radiation/quality/duration/timing/severity/associated sxs/prior Treatment) HPI  She is a 45 year old woman here for evaluation of dysuria. She states this started 3 days ago and has been persistent. It is associated with urinary frequency, but small volumes. No abdominal pain. No flank pain. No nausea or vomiting. No fevers or chills. She has had a UTI in the past and this feels the same. No known hematuria. She has had a tubal ligation.  Past Medical History  Diagnosis Date  . Sinusitis   . Allergy    Past Surgical History  Procedure Laterality Date  . Cesarean section    . Cesarean section    . Tubal ligation     Family History  Problem Relation Age of Onset  . Hypertension Mother   . Emphysema Mother   . Hypertension Father   . Thyroid disease Sister   . Breast cancer Sister   . Hyperlipidemia Brother   . Thyroid disease Paternal Aunt   . Thyroid disease Sister   . Asthma Sister   . Anemia Sister   . Thyroid disease Sister    Social History  Substance Use Topics  . Smoking status: Never Smoker   . Smokeless tobacco: Never Used  . Alcohol Use: No   OB History    No data available     Review of Systems As in history of present illness Allergies  Review of patient's allergies indicates no known allergies.  Home Medications   Prior to Admission medications   Medication Sig Start Date End Date Taking? Authorizing Provider  cephALEXin (KEFLEX) 500 MG capsule Take 1 capsule (500 mg total) by mouth 4 (four) times daily. 01/25/16   Melony Overly, MD  Fe Fum-FePoly-Vit C-Vit B3 (INTEGRA) 62.5-62.5-40-3 MG CAPS Take one daily 07/26/14   Lanice Shirts, MD  propranolol (INDERAL) 10 MG tablet Take 1 tablet (10 mg total) by mouth daily as needed. Patient not  taking: Reported on 11/30/2014 10/31/14   Josue Hector, MD   Meds Ordered and Administered this Visit  Medications - No data to display  BP 102/70 mmHg  Pulse 95  Temp(Src) 98.8 F (37.1 C) (Oral)  Resp 16  Ht 5\' 2"  (1.575 m)  Wt 135 lb (61.236 kg)  BMI 24.69 kg/m2  SpO2 100%  LMP 01/12/2016 No data found.   Physical Exam  Constitutional: She is oriented to person, place, and time. She appears well-developed and well-nourished. No distress.  Cardiovascular: Normal rate.   Pulmonary/Chest: Effort normal.  Abdominal: Soft. Bowel sounds are normal. She exhibits no distension. There is no tenderness. There is no rebound and no guarding.  No CVA tenderness  Neurological: She is alert and oriented to person, place, and time.    ED Course  Procedures (including critical care time)  Labs Review Labs Reviewed  POCT URINALYSIS DIP (DEVICE) - Abnormal; Notable for the following:    Hgb urine dipstick TRACE (*)    All other components within normal limits  URINE CULTURE    Imaging Review No results found.   MDM   1. UTI (lower urinary tract infection)    Urine with trace hemoglobin. Symptoms are consistent with UTI. We'll treat with Keflex and send urine for culture. Follow-up as needed.    Erick Colace  Bridgett Larsson, MD 01/25/16 1407

## 2016-01-26 LAB — URINE CULTURE

## 2016-04-10 DIAGNOSIS — Z1231 Encounter for screening mammogram for malignant neoplasm of breast: Secondary | ICD-10-CM | POA: Diagnosis not present

## 2016-05-07 DIAGNOSIS — M545 Low back pain: Secondary | ICD-10-CM | POA: Diagnosis not present

## 2016-05-07 DIAGNOSIS — M1288 Other specific arthropathies, not elsewhere classified, other specified site: Secondary | ICD-10-CM | POA: Diagnosis not present

## 2016-06-26 ENCOUNTER — Ambulatory Visit: Payer: 59 | Attending: Orthopedic Surgery | Admitting: Physical Therapy

## 2016-06-26 ENCOUNTER — Encounter: Payer: Self-pay | Admitting: Physical Therapy

## 2016-06-26 DIAGNOSIS — G8929 Other chronic pain: Secondary | ICD-10-CM | POA: Insufficient documentation

## 2016-06-26 DIAGNOSIS — M542 Cervicalgia: Secondary | ICD-10-CM | POA: Diagnosis not present

## 2016-06-26 DIAGNOSIS — M545 Low back pain: Secondary | ICD-10-CM | POA: Diagnosis not present

## 2016-06-26 DIAGNOSIS — R252 Cramp and spasm: Secondary | ICD-10-CM | POA: Diagnosis not present

## 2016-06-26 DIAGNOSIS — H52222 Regular astigmatism, left eye: Secondary | ICD-10-CM | POA: Diagnosis not present

## 2016-06-26 NOTE — Therapy (Signed)
[place], [place], [address] Phone: [phone]   Fax:  [phone]  Physical Therapy Evaluation  Patient Details  Name: Amy Terry MRN: [id number] Date of Birth: Oct 12, 1970 Referring Provider: D. Rolena Infante  Encounter Date: 06/26/2016      PT End of Session - 06/26/16 1420    Visit Number 1   Date for PT Re-Evaluation 08/26/16   PT Start Time [id number]   PT Stop Time 1440   PT Time Calculation (min) 53 min   Activity Tolerance Patient tolerated treatment well   Behavior During Therapy [facility] for tasks assessed/performed      Past Medical History:  Diagnosis Date  . Allergy   . Sinusitis     Past Surgical History:  Procedure Laterality Date  . CESAREAN SECTION    . CESAREAN SECTION    . TUBAL LIGATION      There were no vitals filed for this visit.       Subjective Assessment - 06/26/16 1353    Subjective Patient reports pain a little more than a year.  MRI shows mild degenerative changes.  Some right buttock pain, she is unsure of a cause.  Reports a pain that comes and goes.   Limitations Lifting   Patient Stated Goals have less pain   Currently in Pain? Yes   Pain Score 1    Pain Location Back   Pain Orientation Lower;Right;Left   Pain Descriptors / Indicators Aching   Pain Type Chronic pain   Pain Radiating Towards some pain into the buttocks, right > left   Pain Onset More than a month ago   Pain Frequency Intermittent   Aggravating Factors  bending will increase pain to 8/10, long periods of standing will increase the pain as well   Pain Relieving Factors change position, movements will decrease the pain to 0/10   Effect of Pain on Daily Activities difficulty with work            [facility] PT Assessment - 06/26/16 0001      Assessment   Medical Diagnosis LBP   Referring Provider D. Brooks   Onset Date/Surgical Date 05/26/16   Prior Therapy no     Precautions   Precautions None     Balance Screen   Has the patient fallen in the past 6 months No   Has the patient had a decrease in activity level because of a fear of falling?  No   Is the patient reluctant to leave their home because of a fear of falling?  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   Leisure no exercise     Posture/Postural Control   Posture Comments slouched sitting posture, fwd head and rounded shoudlers     ROM / Strength   AROM / PROM / Strength AROM;Strength     AROM   Overall AROM Comments Lumbar ROM was WNL's except for extension caused some pain and was limited 25%, Cervical ROM was WNL's except for side bending was decreased 50% with c/o tightness, the shoulder ROM was WNL's except for IR was to 50 degrees with some right shoulder pain     Strength   Overall Strength Comments  WNL's     Palpation   Palpation comment some spasms in the lumbar and buttocks but non tender, she has significant spasms in the right upper trap and was tender , some spasms in the cervical p-spinals                   [facility] Adult PT Treatment/Exercise - 06/26/16 0001      Modalities   Modalities Electrical Stimulation;Moist Heat     Moist Heat Therapy   Number Minutes Moist Heat 15 Minutes   Moist Heat Location Cervical     Electrical Stimulation   Electrical Stimulation Location right upper trap area   Electrical Stimulation Action IFC   Electrical Stimulation Parameters supine   Electrical Stimulation Goals Pain                PT Education - 06/26/16 1418    Education provided Yes   Education Details Wms flexion for lumbar, cervical and scapular retraction, upper trap and levator stretches   Person(s) Educated Patient   Methods  Explanation;Demonstration;Handout   Comprehension Verbalized understanding          PT Short Term Goals - 06/26/16 1424      PT SHORT TERM GOAL #1   Title independent with initial HEP   Time 2   Period Weeks   Status New           PT Long Term Goals - 06/26/16 1425      PT LONG TERM GOAL #1   Title report pain decreased 25%   Time 8   Period Weeks   Status New     PT LONG TERM GOAL #2   Title increase cervical ROM 25%   Time 8   Period Weeks   Status New     PT LONG TERM GOAL #3   Title increase lumbar ROM 25%   Time 8   Period Weeks   Status New     PT LONG TERM GOAL #4   Title understand proper posture and body mechanics   Time 8   Period Weeks   Status New               Plan - 06/26/16 1421    Clinical Impression Statement Patient with low back pain, right upper trap and neck pain, the lumbar MRI showed mild degenerative changes, she has significant spasms in the upper traps and levator area on the right.  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 transfers increase the pain   Rehab Potential Good   PT Frequency 2x / week   PT Duration 8 weeks   PT Treatment/Interventions ADLs/Self Care Home Management;Electrical Stimulation;Moist Heat;Traction;Therapeutic exercise;Therapeutic activities;Patient/family education;Manual techniques   PT Next Visit Plan try some exercises, could try STM, traction, the upper trap seems to be the biggest issue, has some tightness in the LE's   Consulted and Agree with Plan of Care Patient      Patient will benefit from skilled therapeutic intervention in order to improve the following deficits and impairments:  Decreased range of motion, Increased fascial restricitons, Increased muscle spasms, Impaired flexibility, Postural dysfunction, Improper body mechanics, Pain  Visit Diagnosis: Chronic midline low back pain without sciatica - Plan: PT plan of care cert/re-cert  Cramp and spasm - Plan: PT plan of care  cert/re-cert  Cervicalgia - Plan: PT plan of care cert/re-cert     Problem List Patient Active Problem List   Diagnosis Date Noted  .  Iron deficiency anemia 07/26/2014  . Palpitations 07/21/2014  . Allergic rhinitis 07/21/2014    Sumner Boast., PT 06/26/2016, 2:29 PM  [place], [place], [address] Phone: [phone]   Fax:  [phone]  Name: Amy Terry MRN: [id number] Date of Birth: 05-26-1971

## 2016-06-27 DIAGNOSIS — H52222 Regular astigmatism, left eye: Secondary | ICD-10-CM | POA: Diagnosis not present

## 2016-06-27 DIAGNOSIS — H5201 Hypermetropia, right eye: Secondary | ICD-10-CM | POA: Diagnosis not present

## 2016-06-27 DIAGNOSIS — H524 Presbyopia: Secondary | ICD-10-CM | POA: Diagnosis not present

## 2016-07-02 ENCOUNTER — Encounter: Payer: Self-pay | Admitting: Physical Therapy

## 2016-07-02 ENCOUNTER — Ambulatory Visit: Payer: 59 | Admitting: Physical Therapy

## 2016-07-02 DIAGNOSIS — M545 Low back pain, unspecified: Secondary | ICD-10-CM

## 2016-07-02 DIAGNOSIS — G8929 Other chronic pain: Secondary | ICD-10-CM | POA: Diagnosis not present

## 2016-07-02 DIAGNOSIS — R252 Cramp and spasm: Secondary | ICD-10-CM | POA: Diagnosis not present

## 2016-07-02 DIAGNOSIS — M542 Cervicalgia: Secondary | ICD-10-CM | POA: Diagnosis not present

## 2016-07-02 DIAGNOSIS — H52222 Regular astigmatism, left eye: Secondary | ICD-10-CM | POA: Diagnosis not present

## 2016-07-02 NOTE — Therapy (Signed)
[place], [place], [address] Phone: [phone]   Fax:  [phone]  Physical Therapy Treatment  Patient Details  Name: Amy Terry MRN: [id number] Date of Birth: 12/18/1970 Referring Provider: D. Rolena Infante  Encounter Date: 07/02/2016      PT End of Session - 07/02/16 1007    Visit Number 2   Date for PT Re-Evaluation 08/26/16   PT Start Time [id number]   PT Stop Time 1025   PT Time Calculation (min) 47 min      Past Medical History:  Diagnosis Date  . Allergy   . Sinusitis     Past Surgical History:  Procedure Laterality Date  . CESAREAN SECTION    . CESAREAN SECTION    . TUBAL LIGATION      There were no vitals filed for this visit.      Subjective Assessment - 07/02/16 0940    Subjective just mild Rt shld pain. doing some of HEP.   Currently in Pain? Yes   Pain Score 3    Pain Location Shoulder   Pain Orientation Right                         [facility] Adult PT Treatment/Exercise - 07/02/16 0001      Exercises   Exercises Shoulder;Neck     Neck Exercises: Standing   Neck Retraction 15 reps  with ball   Wall Push Ups 15 reps  with ball   Other Standing Exercises 4# shruggs and backward rolls 15 times each     Shoulder Exercises: Standing   External Rotation Strengthening;Both;10 reps;Theraband   Theraband Level (Shoulder External Rotation) Level 2 (Red)   ABduction Strengthening;Both;10 reps;Theraband   Theraband Level (Shoulder ABduction) Level 2 (Red)   Extension Strengthening;Both;10 reps;Theraband   Theraband Level (Shoulder Extension) Level 2 (Red)   Row Strengthening;Both;10 reps;Theraband   Theraband Level (Shoulder Row) Level 2 (Red)     Shoulder Exercises: ROM/Strengthening   Other ROM/Strengthening Exercises Nustep L 5 6 min     Moist Heat Therapy   Number Minutes Moist Heat 15 Minutes   Moist Heat Location Cervical     Electrical  Stimulation   Electrical Stimulation Location right upper trap area   Electrical Stimulation Action IFC   Electrical Stimulation Parameters supine   Electrical Stimulation Goals Pain     Manual Therapy   Manual Therapy Soft tissue mobilization   Soft tissue mobilization RT cerv,UT and rhom- large trigger pts noted throughout                PT Education - 07/02/16 1006    Education provided Yes   Education Details reviewed HEP from last session and rec 2 times daily   Person(s) Educated Patient   Methods Explanation;Demonstration   Comprehension Returned demonstration;Verbalized understanding          PT Short Term Goals - 07/02/16 1013      PT SHORT TERM GOAL #1   Title independent with initial HEP   Status Achieved           PT Long Term Goals - 06/26/16 1425      PT LONG TERM GOAL #1   Title report pain decreased 25%   Time 8   Period Weeks   Status New     PT LONG TERM GOAL #2   Title increase cervical ROM 25%   Time 8  Period Weeks   Status New     PT LONG TERM GOAL #3   Title increase lumbar ROM 25%   Time 8   Period Weeks   Status New     PT LONG TERM GOAL #4   Title understand proper posture and body mechanics   Time 8   Period Weeks   Status New               Plan - 07/02/16 1007    Clinical Impression Statement pt tolerated ther ex well, stressed importance of HEP. trigger pts in RT UT and rhomoids. good relief with modalities   PT Next Visit Plan try some exercises, could try STM, traction, the upper trap seems to be the biggest issue, has some tightness in the LE's      Patient will benefit from skilled therapeutic intervention in order to improve the following deficits and impairments:  Decreased range of motion, Increased fascial restricitons, Increased muscle spasms, Impaired flexibility, Postural dysfunction, Improper body mechanics, Pain  Visit Diagnosis: Chronic midline low back pain without sciatica  Cramp and  spasm  Cervicalgia     Problem List Patient Active Problem List   Diagnosis Date Noted  . Iron deficiency anemia 07/26/2014  . Palpitations 07/21/2014  . Allergic rhinitis 07/21/2014    PAYSEUR,ANGIE PTA 07/02/2016, 10:14 AM  [place], [place], [address] Phone: [phone]   Fax:  [phone]  Name: ELLYETTE ENGBLOM MRN: [id number] Date of Birth: 1971/01/26

## 2016-07-08 ENCOUNTER — Ambulatory Visit: Payer: 59 | Admitting: Physical Therapy

## 2016-07-11 ENCOUNTER — Ambulatory Visit: Payer: 59 | Admitting: Physical Therapy

## 2016-07-11 ENCOUNTER — Encounter: Payer: Self-pay | Admitting: Physical Therapy

## 2016-07-11 DIAGNOSIS — G8929 Other chronic pain: Secondary | ICD-10-CM | POA: Diagnosis not present

## 2016-07-11 DIAGNOSIS — M542 Cervicalgia: Secondary | ICD-10-CM

## 2016-07-11 DIAGNOSIS — H52222 Regular astigmatism, left eye: Secondary | ICD-10-CM | POA: Diagnosis not present

## 2016-07-11 DIAGNOSIS — M545 Low back pain: Secondary | ICD-10-CM | POA: Diagnosis not present

## 2016-07-11 DIAGNOSIS — R252 Cramp and spasm: Secondary | ICD-10-CM | POA: Diagnosis not present

## 2016-07-11 NOTE — Therapy (Signed)
[place], [place], [address] Phone: [phone]   Fax:  [phone]  Physical Therapy Treatment  Patient Details  Name: Amy Terry MRN: [id number] Date of Birth: 1971-07-22 Referring Provider: D. Rolena Infante  Encounter Date: 07/11/2016      PT End of Session - 07/11/16 1012    Visit Number 3   Date for PT Re-Evaluation 08/26/16   PT Start Time 0933   PT Stop Time 1027   PT Time Calculation (min) 54 min   Activity Tolerance Patient tolerated treatment well   Behavior During Therapy [facility] for tasks assessed/performed      Past Medical History:  Diagnosis Date  . Allergy   . Sinusitis     Past Surgical History:  Procedure Laterality Date  . CESAREAN SECTION    . CESAREAN SECTION    . TUBAL LIGATION      There were no vitals filed for this visit.      Subjective Assessment - 07/11/16 0932    Subjective Pt reports some R shoulder pain, Pt reports that her low back is good, it only hurts with pro long standing and bending   Currently in Pain? Yes   Pain Score 4    Pain Location Shoulder   Pain Orientation Right                         [facility] Adult PT Treatment/Exercise - 07/11/16 0001      Neck Exercises: Machines for Strengthening   UBE (Upper Arm Bike) L2 3frd/3rev     Shoulder Exercises: Standing   External Rotation Strengthening;Both;10 reps;Theraband  x2   Theraband Level (Shoulder External Rotation) Level 2 (Red)   Flexion Both;10 reps;Weights   Shoulder Flexion Weight (lbs) 2   ABduction Strengthening;Both;10 reps;Weights   Shoulder ABduction Weight (lbs) 2   Extension Strengthening;Both;10 reps;Theraband  x2   Theraband Level (Shoulder Extension) Level 2 (Red)   Row Strengthening;Both;10 reps;Theraband  x2   Theraband Level (Shoulder Row) Level 2 (Red)   Other Standing Exercises shrugs 5lb 2x10     Moist Heat Therapy   Number Minutes Moist  Heat 15 Minutes   Moist Heat Location Cervical     Electrical Stimulation   Electrical Stimulation Location right upper trap area   Electrical Stimulation Action IFC   Electrical Stimulation Parameters supine   Electrical Stimulation Goals Pain     Manual Therapy   Manual Therapy Soft tissue mobilization   Soft tissue mobilization RT cerv,UT and rhom- large trigger pts noted throughout                  PT Short Term Goals - 07/02/16 1013      PT SHORT TERM GOAL #1   Title independent with initial HEP   Status Achieved           PT Long Term Goals - 06/26/16 1425      PT LONG TERM GOAL #1   Title report pain decreased 25%   Time 8   Period Weeks   Status New     PT LONG TERM GOAL #2   Title increase cervical ROM 25%   Time 8   Period Weeks   Status New     PT LONG TERM GOAL #3   Title increase lumbar ROM 25%   Time 8   Period Weeks   Status New  PT LONG TERM GOAL #4   Title understand proper posture and body mechanics   Time 8   Period Weeks   Status New               Plan - 07/11/16 1013    Clinical Impression Statement Pt with trigger points in UT and rhomboids. Good tolerance  therapeutic exercises, reports most pain with shoulder ER. Pt does slouch when standing indicating weak core muscles.   Rehab Potential Good   PT Frequency 2x / week   PT Duration 8 weeks   PT Next Visit Plan try some exercises, could try STM, traction, the upper trap seems to be the biggest issue, has some tightness in the LE's      Patient will benefit from skilled therapeutic intervention in order to improve the following deficits and impairments:  Decreased range of motion, Increased fascial restricitons, Increased muscle spasms, Impaired flexibility, Postural dysfunction, Improper body mechanics, Pain  Visit Diagnosis: Chronic midline low back pain without sciatica  Cramp and spasm  Cervicalgia     Problem List Patient Active Problem List    Diagnosis Date Noted  . Iron deficiency anemia 07/26/2014  . Palpitations 07/21/2014  . Allergic rhinitis 07/21/2014    Scot Jun, PTA 07/11/2016, 10:16 AM  [place], [place], [address] Phone: [phone]   Fax:  [phone]  Name: Amy Terry MRN: [id number] Date of Birth: Dec 24, 1970

## 2016-07-15 ENCOUNTER — Ambulatory Visit: Payer: 59 | Attending: Orthopedic Surgery | Admitting: Physical Therapy

## 2016-07-15 ENCOUNTER — Encounter: Payer: Self-pay | Admitting: Physical Therapy

## 2016-07-15 DIAGNOSIS — R252 Cramp and spasm: Secondary | ICD-10-CM | POA: Insufficient documentation

## 2016-07-15 DIAGNOSIS — M542 Cervicalgia: Secondary | ICD-10-CM | POA: Insufficient documentation

## 2016-07-15 DIAGNOSIS — M545 Low back pain, unspecified: Secondary | ICD-10-CM

## 2016-07-15 DIAGNOSIS — G8929 Other chronic pain: Secondary | ICD-10-CM | POA: Diagnosis not present

## 2016-07-15 NOTE — Therapy (Signed)
[place], [place], [address] Phone: [phone]   Fax:  [phone]  Physical Therapy Treatment  Patient Details  Name: MASEN WINDELL MRN: [id number] Date of Birth: [birth year] Referring Provider: D. Rolena Infante  Encounter Date: 07/15/2016      PT End of Session - 07/15/16 0925    Visit Number 4   Date for PT Re-Evaluation 08/26/16   PT Start Time [id number]   PT Stop Time 0939   PT Time Calculation (min) 48 min      Past Medical History:  Diagnosis Date  . Allergy   . Sinusitis     Past Surgical History:  Procedure Laterality Date  . CESAREAN SECTION    . CESAREAN SECTION    . TUBAL LIGATION      There were no vitals filed for this visit.      Subjective Assessment - 07/15/16 0853    Subjective "I think I pinched a nerve in my L shoulder waking up this morning"   Currently in Pain? Yes   Pain Score 3    Pain Location Shoulder   Pain Orientation Left            [facility] PT Assessment - 07/15/16 0001      AROM   Overall AROM Comments Lumbar, Shoulder, and cervical ROM all WNL. Some pain with Lumbar extension and L cervical Rotation                     [facility] Adult PT Treatment/Exercise - 07/15/16 0001      Neck Exercises: Machines for Strengthening   UBE (Upper Arm Bike) L2 25frd/3rev   Other Machines for Strengthening Rows & Lats 20lb 2x10     Neck Exercises: Theraband   Shoulder External Rotation 15 reps;Green;Limitations  Yellow, X2    Horizontal ADduction 15 reps  yellow, X2      Neck Exercises: Standing   Other Standing Exercises Standing streight arm pull downs 25lb 2x10; shruggs 5lb with rev roll 2x10   Other Standing Exercises Standing Rev grip rows 25lb 2x10     Moist Heat Therapy   Number Minutes Moist Heat 15 Minutes   Moist Heat Location Cervical     Electrical Stimulation   Electrical Stimulation Location right upper trap area   Electrical  Stimulation Action IFC   Electrical Stimulation Parameters supine   Electrical Stimulation Goals Pain     Manual Therapy   Manual Therapy Soft tissue mobilization   Soft tissue mobilization RT cerv,UT and rhom- large trigger pts noted throughout                  PT Short Term Goals - 07/02/16 1013      PT SHORT TERM GOAL #1   Title independent with initial HEP   Status Achieved           PT Long Term Goals - 06/26/16 1425      PT LONG TERM GOAL #1   Title report pain decreased 25%   Time 8   Period Weeks   Status New     PT LONG TERM GOAL #2   Title increase cervical ROM 25%   Time 8   Period Weeks   Status New     PT LONG TERM GOAL #3   Title increase lumbar ROM 25%   Time 8   Period Weeks   Status New  PT LONG TERM GOAL #4   Title understand proper posture and body mechanics   Time 8   Period Weeks   Status New               Plan - 07/15/16 0925    Clinical Impression Statement Pt ~6 min late for today's session. Trigger points in UT and rhomboids has decreased. Pt reports in supraspinatus in R shoulder with resisted shoulder ER. Does reports some LBP after prolong standing while doing her exercises.   Rehab Potential Good   PT Frequency 2x / week   PT Duration 8 weeks   PT Treatment/Interventions ADLs/Self Care Home Management;Electrical Stimulation;Moist Heat;Traction;Therapeutic exercise;Therapeutic activities;Patient/family education;Manual techniques   PT Next Visit Plan try some exercises, could try STM, traction, the upper trap seems to be the biggest issue, has some tightness in the LE's      Patient will benefit from skilled therapeutic intervention in order to improve the following deficits and impairments:  Decreased range of motion, Increased fascial restricitons, Increased muscle spasms, Impaired flexibility, Postural dysfunction, Improper body mechanics, Pain  Visit Diagnosis: Cervicalgia  Cramp and spasm  Chronic  midline low back pain without sciatica     Problem List Patient Active Problem List   Diagnosis Date Noted  . Iron deficiency anemia 07/26/2014  . Palpitations 07/21/2014  . Allergic rhinitis 07/21/2014    Scot Jun, PTA 07/15/2016, 9:28 AM  [place], [place], [address] Phone: [phone]   Fax:  [phone]  Name: KYDEN ELWELL MRN: [id number] Date of Birth: 12-12-1970

## 2016-07-16 ENCOUNTER — Encounter: Payer: Self-pay | Admitting: Physical Therapy

## 2016-07-16 ENCOUNTER — Ambulatory Visit: Payer: 59 | Admitting: Physical Therapy

## 2016-07-16 DIAGNOSIS — R252 Cramp and spasm: Secondary | ICD-10-CM | POA: Diagnosis not present

## 2016-07-16 DIAGNOSIS — M545 Low back pain: Secondary | ICD-10-CM | POA: Diagnosis not present

## 2016-07-16 DIAGNOSIS — M542 Cervicalgia: Secondary | ICD-10-CM

## 2016-07-16 DIAGNOSIS — G8929 Other chronic pain: Secondary | ICD-10-CM | POA: Diagnosis not present

## 2016-07-16 NOTE — Therapy (Signed)
[place], [place], [address] Phone: [phone]   Fax:  [phone]  Physical Therapy Treatment  Patient Details  Name: Amy Terry MRN: [id number] Date of Birth: [birth year] Referring Provider: D. Rolena Terry  Encounter Date: 07/16/2016      PT End of Session - 07/16/16 1600    Visit Number 5   Date for PT Re-Evaluation 08/26/16   PT Start Time 1532   PT Stop Time 1620   PT Time Calculation (min) 48 min      Past Medical History:  Diagnosis Date  . Allergy   . Sinusitis     Past Surgical History:  Procedure Laterality Date  . CESAREAN SECTION    . CESAREAN SECTION    . TUBAL LIGATION      There were no vitals filed for this visit.      Subjective Assessment - 07/16/16 1535    Subjective 50% better. pain comes and goes.   Currently in Pain? No/denies                         Amy Terry Adult PT Treatment/Exercise - 07/16/16 0001      Neck Exercises: Machines for Strengthening   UBE (Upper Arm Bike) L 3 3 fwd/3 backward   Other Machines for Strengthening Rows & Lats 20lb 2x15   Other Machines for Strengthening straight arm pull down 10 # 2 sets 10     Shoulder Exercises: Standing   External Rotation Strengthening;Both;15 reps;Theraband   Theraband Level (Shoulder External Rotation) Level 2 (Red)   Other Standing Exercises 3# 4 pt rhy stab 15 times each  fatigue and pain- freq rest needed     Shoulder Exercises: ROM/Strengthening   Wall Pushups 15 reps  with ball     Moist Heat Therapy   Number Minutes Moist Heat 15 Minutes   Moist Heat Location Cervical     Electrical Stimulation   Electrical Stimulation Location right upper trap area   Electrical Stimulation Action IFC   Electrical Stimulation Parameters supine   Electrical Stimulation Goals Pain                  PT Short Term Goals - 07/02/16 1013      PT SHORT TERM GOAL #1   Title  independent with initial HEP   Status Achieved           PT Long Term Goals - 06/26/16 1425      PT LONG TERM GOAL #1   Title report pain decreased 25%   Time 8   Period Weeks   Status New     PT LONG TERM GOAL #2   Title increase cervical ROM 25%   Time 8   Period Weeks   Status New     PT LONG TERM GOAL #3   Title increase lumbar ROM 25%   Time 8   Period Weeks   Status New     PT LONG TERM GOAL #4   Title understand proper posture and body mechanics   Time 8   Period Weeks   Status New               Plan - 07/16/16 1600    Clinical Impression Statement denies RT shld at rest but increases with activity. c/o LBP with prolonged standing. weakness and fatigue noted.   PT Next Visit Plan assess goals  Patient will benefit from skilled therapeutic intervention in order to improve the following deficits and impairments:  Decreased range of motion, Increased fascial restricitons, Increased muscle spasms, Impaired flexibility, Postural dysfunction, Improper body mechanics, Pain  Visit Diagnosis: Cervicalgia  Cramp and spasm     Problem List Patient Active Problem List   Diagnosis Date Noted  . Iron deficiency anemia 07/26/2014  . Palpitations 07/21/2014  . Allergic rhinitis 07/21/2014    Amy Terry,ANGIE PTA 07/16/2016, 4:11 PM  [place], [place], [address] Phone: [phone]   Fax:  [phone]  Name: Amy Terry MRN: [id number] Date of Birth: 02-03-1971

## 2016-07-17 ENCOUNTER — Ambulatory Visit: Payer: 59 | Admitting: Physical Therapy

## 2016-09-09 NOTE — Progress Notes (Signed)
Patient ID: Amy Terry, female   DOB: [birth year], 46 y.o.   MRN: [id number]     Cardiology Office Note   Date:  09/18/2016   ID:  XITLALI ROTHFUS, DOB Sep 08, 1970, MRN [id number]  PCP:  Kelton Pillar, MD  Cardiologist:   [name], MD   Chief Complaint  Patient presents with  . Bicuspid aortic valve  . Heart Murmur      History of Present Illness: Amy Terry is a 46 y.o. female who presents for  Palpitations and dizzyness when leaning forward She has had this over the last year.  Palpitations 1-2x/week  Flip flops No prolonged episodes  Occur at home or work  Works nights on [employer]  Some stress.  No excess ETOH/ caffeine or stimulants.  Strong family history of thyroid issues in sisters No syncope or chest pain.  Use to due Zumba but when last tried 3 months ago felt dyspnea and light headedness  Dizzy symptoms due seem to be with change in position  Labs reviewed TSH and Hct normal   F/U echo reviewed  Mean gradient 15 mmHg peak 26 mmgh Bicuspid valve likely aortic root mildly dilated but measurement indicates 34 mm  Impressions:  3.25.16    - Normal LV size and systolic function, EF 0000000. Normal diastolic function. Normal RV size and systolic function. The aortic valve is abnormal, suspect bicuspid, but cannot visualize the leaflets adequately enough on this study to be definitive. There is mild aortic stenosis. The ascending aorta is very mildly dilated.  Event monitor:   Reviewed from  April  Just NSR no arrhythmia  (over 50 pages)  She indicates still with palpitations and she did not have them when she had monitor  Discussed diagnosis of bicuspid AV and association with aortopathy and need for serial f/u and r/o aneurysms.     Cardiac MRI 12/21/14   Bicuspid AV EF 70% meand gradient 15 mmHg Root 3.6 cm    Past Medical History:  Diagnosis Date  . Allergy   . Sinusitis     Past Surgical History:  Procedure Laterality Date  . CESAREAN  SECTION    . CESAREAN SECTION    . TUBAL LIGATION       Current Outpatient Prescriptions  Medication Sig Dispense Refill  . propranolol (INDERAL) 10 MG tablet Take 1 tablet (10 mg total) by mouth daily as needed. 30 tablet 2   No current facility-administered medications for this visit.     Allergies:   Patient has no known allergies.    Social History:  The patient  reports that she has never smoked. She has never used smokeless tobacco. She reports that she does not drink alcohol or use drugs.   Family History:  The patient's family history includes Anemia in her sister; Asthma in her sister; Breast cancer in her sister; Emphysema in her mother; Hyperlipidemia in her brother; Hypertension in her father and mother; Thyroid disease in her paternal aunt, sister, sister, and sister.    ROS:  Please see the history of present illness.   Otherwise, review of systems are positive for painful urination    All other systems are reviewed and negative.     PHYSICAL EXAM: VS:  BP 100/60   Pulse (!) 54   Ht 5\' 1"  (1.549 m)   Wt 134 lb 1.9 oz (60.8 kg)   SpO2 96%   BMI 25.34 kg/m  , BMI Body mass index is 25.34 kg/m. GEN: Well  nourished, well developed, in no acute distress  HEENT: normal  Neck: no JVD, carotid bruits, or masses Cardiac:  RRR; SEM accentuates with valsalva no , rubs, or gallops,no edema  Respiratory:  clear to auscultation bilaterally, normal work of breathing GI: soft, nontender, nondistended, + BS MS: no deformity or atrophy  Skin: warm and dry, no rash Neuro:  Strength and sensation are intact Psych: euthymic mood, full affect   EKG:   07/21/14   SR rate 82 normal ECG  09/18/16 SR rate 77 LAE otherwise normal    Recent Labs: No results found for requested labs within last 8760 hours.    Lipid Panel    Component Value Date/Time   CHOL 156 07/21/2014 1343   TRIG 42 07/21/2014 1343   HDL 68 07/21/2014 1343   CHOLHDL 2.3 07/21/2014 1343   VLDL 8 07/21/2014  1343   LDLCALC 80 07/21/2014 1343      Wt Readings from Last 3 Encounters:  09/18/16 134 lb 1.9 oz (60.8 kg)  01/25/16 135 lb (61.2 kg)  11/30/14 145 lb (65.8 kg)      Other studies Reviewed: Additional studies/ records that were reviewed today include: Epic records.    ASSESSMENT AND PLAN:  1.  Palpitations- benign nothing on event monitor normal ECG and EF by echo Hct/TSH normal PRN inderal repeat monitor when symptomatic in future  2. Murmur  Mild AS with bicuspid AV aortic root 3.6 cm MRA aorta ok  MRA head no aneursyms f/u echo in a year murmur still mild  To assess gradients    Current medicines are reviewed at length with the patient today.  The patient does not have concerns regarding medicines.  The following changes have been made:  Inderal 10 mg PRN  Labs/ tests ordered today include:  Echo   Orders Placed This Encounter  Procedures  . EKG 12-Lead     Disposition:   FU with me in a year     Signed, [name], MD  09/18/2016 11:16 AM    [facility] [address], [place], [place]  [address] Phone: [phone]; Fax: [phone]

## 2016-09-12 ENCOUNTER — Encounter: Payer: Self-pay | Admitting: *Deleted

## 2016-09-18 ENCOUNTER — Encounter: Payer: Self-pay | Admitting: Cardiovascular Disease

## 2016-09-18 ENCOUNTER — Ambulatory Visit (INDEPENDENT_AMBULATORY_CARE_PROVIDER_SITE_OTHER): Payer: 59 | Admitting: Cardiovascular Disease

## 2016-09-18 VITALS — BP 100/60 | HR 54 | Ht 61.0 in | Wt 134.1 lb

## 2016-09-18 DIAGNOSIS — R011 Cardiac murmur, unspecified: Secondary | ICD-10-CM | POA: Diagnosis not present

## 2016-09-18 DIAGNOSIS — Q231 Congenital insufficiency of aortic valve: Secondary | ICD-10-CM | POA: Diagnosis not present

## 2016-09-18 MED ORDER — PROPRANOLOL HCL 10 MG PO TABS: 10.0000 mg | ORAL_TABLET | Freq: Every day | ORAL | 2 refills | Status: DC | PRN

## 2016-09-18 MED FILL — PROPRANOLOL 10 MG TABLET: 10 | 30 days supply | Qty: 30 | Fill #0

## 2016-09-18 NOTE — Patient Instructions (Addendum)

## 2016-10-05 ENCOUNTER — Ambulatory Visit (HOSPITAL_COMMUNITY)
Admission: EM | Admit: 2016-10-05 | Discharge: 2016-10-05 | Disposition: A | Discharge status: Home / Self Care | Payer: 59 | Attending: Family Medicine | Admitting: Family Medicine

## 2016-10-05 ENCOUNTER — Ambulatory Visit (INDEPENDENT_AMBULATORY_CARE_PROVIDER_SITE_OTHER): Payer: 59

## 2016-10-05 ENCOUNTER — Encounter (HOSPITAL_COMMUNITY): Payer: Self-pay | Admitting: Emergency Medicine

## 2016-10-05 DIAGNOSIS — J181 Lobar pneumonia, unspecified organism: Secondary | ICD-10-CM | POA: Diagnosis not present

## 2016-10-05 DIAGNOSIS — R05 Cough: Secondary | ICD-10-CM | POA: Diagnosis not present

## 2016-10-05 DIAGNOSIS — J189 Pneumonia, unspecified organism: Secondary | ICD-10-CM

## 2016-10-05 HISTORY — DX: Urinary tract infection, site not specified: N39.0

## 2016-10-05 HISTORY — DX: Cardiac murmur, unspecified: R01.1

## 2016-10-05 HISTORY — DX: Pneumonia, unspecified organism: J18.9

## 2016-10-05 MED ORDER — AZITHROMYCIN 250 MG PO TABS: 250.0000 mg | ORAL_TABLET | Freq: Every day | ORAL | 0 refills | Status: DC

## 2016-10-05 NOTE — ED Triage Notes (Signed)
Symptoms for a week, green phlegm.  Reports chest and back pain related to cough.  Highest temperature at home has 100.5.  Patient is concerned for pneumonia, history of the same

## 2016-10-05 NOTE — ED Provider Notes (Signed)
CSN: [id number]     Arrival date & time 10/05/16  1442 History   First MD Initiated Contact with Patient 10/05/16 1526     Chief Complaint  Patient presents with  . Cough   (Consider location/radiation/quality/duration/timing/severity/associated sxs/prior Treatment) She is a fairly healthy 46 year old female, presents today for a cough of one week's duration but got worse 2 days ago accompanied by nasal drainage, fatigue, fever of highest 100.5 at home, frontal sinus headache and occasionally feeling out of breath. She denies dizziness, chills, runny nose. She does endorses chest pain but believe her chest pain is due to coughing. She is particularly concerned for pneumonia because she has had pneumonia in the past.       Past Medical History:  Diagnosis Date  . Allergy   . Heart murmur   . Pneumonia   . Sinusitis   . UTI (urinary tract infection)    Past Surgical History:  Procedure Laterality Date  . CESAREAN SECTION    . CESAREAN SECTION    . TUBAL LIGATION     Family History  Problem Relation Age of Onset  . Hypertension Mother   . Emphysema Mother   . Hypertension Father   . Thyroid disease Sister   . Breast cancer Sister   . Hyperlipidemia Brother   . Thyroid disease Paternal Aunt   . Thyroid disease Sister   . Asthma Sister   . Anemia Sister   . Thyroid disease Sister    Social History  Substance Use Topics  . Smoking status: Never Smoker  . Smokeless tobacco: Never Used  . Alcohol use No   OB History    No data available     Review of Systems  Constitutional:       As stated in the history of present illness.    Allergies  Patient has no known allergies.  Home Medications   Prior to Admission medications   Medication Sig Start Date End Date Taking? Authorizing Provider  OVER THE COUNTER MEDICATION Prescription weight loss medication   Yes Historical Provider, MD  Pseudoeph-Doxylamine-DM-APAP (NYQUIL PO) Take by mouth.   Yes Historical  Provider, MD  azithromycin (ZITHROMAX) 250 MG tablet Take 1 tablet (250 mg total) by mouth daily. Take first 2 tablets together, then 1 every day until finished. 10/05/16   Barry Dienes, NP  propranolol (INDERAL) 10 MG tablet Take 1 tablet (10 mg total) by mouth daily as needed. 09/18/16   Josue Hector, MD   Meds Ordered and Administered this Visit  Medications - No data to display  BP 117/77 (BP Location: Left Arm)   Pulse 88   Temp 98.7 F (37.1 C) (Oral)   Resp 18   SpO2 99%  No data found.   Physical Exam  Constitutional: She is oriented to person, place, and time. She appears well-developed and well-nourished.  HENT:  Head: Normocephalic and atraumatic.  Right Ear: External ear normal.  Left Ear: External ear normal.  Nose: Nose normal.  Mouth/Throat: Oropharynx is clear and moist. No oropharyngeal exudate.  TM pearly gray bilaterally with no erythema. Right frontal sinus is tender to palpate.  Eyes: Conjunctivae are normal. Pupils are equal, round, and reactive to light.  Neck: Normal range of motion. Neck supple.  Cardiovascular: Normal rate, regular rhythm and normal heart sounds.   Pulmonary/Chest: Effort normal and breath sounds normal.  Abdominal: Soft. Bowel sounds are normal. She exhibits no distension. There is no tenderness.  Lymphadenopathy:    She  has no cervical adenopathy.  Neurological: She is alert and oriented to person, place, and time.  Skin: Skin is warm and dry.  Psychiatric: She has a normal mood and affect.  Nursing note and vitals reviewed.   Urgent Care Course     Procedures (including critical care time)  Labs Review Labs Reviewed - No data to display  Imaging Review Dg Chest 2 View  Result Date: 10/05/2016 CLINICAL DATA:  Productive cough and low grade fever for 1 week. EXAM: CHEST  2 VIEW COMPARISON:  PA and lateral chest 08/15/1998 Sir seen in 10/27/2006. CT chest 11/11/2006. FINDINGS: There is airspace disease in the lingula with  partial obscuration of the left heart border. The lungs otherwise appear clear. No pneumothorax or pleural fluid. No bony abnormality. IMPRESSION: Airspace disease in the lingula worrisome for pneumonia. Electronically Signed   By: Inge Rise M.D.   On: 10/05/2016 16:13     MDM   1. Community acquired pneumonia of left lower lobe of lung ([place])    Xray shows airspace disease in the lingula worrisome for pneumonia. We'll treat empirically with Z-Pak. Educational handout given. Work note given for [employer]. Patient denies any questions. Informed to follow with PCP if no improvement is noted.    Barry Dienes, NP 10/05/16 [phone]

## 2017-05-19 ENCOUNTER — Ambulatory Visit (INDEPENDENT_AMBULATORY_CARE_PROVIDER_SITE_OTHER): Payer: 59

## 2017-05-19 ENCOUNTER — Encounter (HOSPITAL_COMMUNITY): Payer: Self-pay | Admitting: Emergency Medicine

## 2017-05-19 ENCOUNTER — Ambulatory Visit (HOSPITAL_COMMUNITY)
Admission: EM | Admit: 2017-05-19 | Discharge: 2017-05-19 | Disposition: A | Discharge status: Home / Self Care | Payer: 59 | Attending: Urgent Care | Admitting: Urgent Care

## 2017-05-19 DIAGNOSIS — R079 Chest pain, unspecified: Secondary | ICD-10-CM | POA: Diagnosis not present

## 2017-05-19 DIAGNOSIS — Z9109 Other allergy status, other than to drugs and biological substances: Secondary | ICD-10-CM

## 2017-05-19 DIAGNOSIS — R059 Cough, unspecified: Secondary | ICD-10-CM

## 2017-05-19 DIAGNOSIS — R05 Cough: Secondary | ICD-10-CM

## 2017-05-19 DIAGNOSIS — R0982 Postnasal drip: Secondary | ICD-10-CM

## 2017-05-19 DIAGNOSIS — R0789 Other chest pain: Secondary | ICD-10-CM | POA: Diagnosis not present

## 2017-05-19 MED ORDER — CETIRIZINE HCL 10 MG PO TABS: 10.0000 mg | ORAL_TABLET | Freq: Every day | ORAL | 0 refills | Status: DC

## 2017-05-19 MED ORDER — FLUTICASONE PROPIONATE 50 MCG/ACT NA SUSP: 2.0000 | Freq: Every day | NASAL | 12 refills | Status: DC

## 2017-05-19 MED ORDER — HYDROCODONE-HOMATROPINE 5-1.5 MG/5ML PO SYRP: 5.0000 mL | ORAL_SOLUTION | Freq: Every evening | ORAL | 0 refills | Status: DC | PRN

## 2017-05-19 MED ORDER — PSEUDOEPHEDRINE HCL ER 120 MG PO TB12: 120.0000 mg | ORAL_TABLET | Freq: Two times a day (BID) | ORAL | 3 refills | Status: DC

## 2017-05-19 NOTE — ED Triage Notes (Signed)
Pt here for cough and pain with cough x 4 days

## 2017-05-19 NOTE — ED Provider Notes (Signed)
MRN: [id number] DOB: [birth year]  Subjective:   Amy Terry is a 46 y.o. female presenting for chief complaint of Cough  Reports 4 day history of productive cough now having mid-sternal and right sided chest pain from her cough. States that it feels like rib pain, felt something pop during a coughing fit. Denies fever, sinus pain, ear pain, sore throat, shob, wheezing, n/v, abdominal pain, rashes. Denies smoking cigarettes. Has used benadryl and Nasonex for allergies in the past but is not consistent with this. Admits chronic drainage in her sinuses and throat.  Amy Terry is not currently taking any medications and has No Known Allergies.  Amy Terry  has a past medical history of Allergy; Heart murmur; Pneumonia; Sinusitis; and UTI (urinary tract infection). Also  has a past surgical history that includes Cesarean section; Cesarean section; and Tubal ligation.  Objective:   Vitals: BP 108/65 (BP Location: Right Arm)   Pulse 73   Temp 99 F (37.2 C) (Oral)   Resp 18   SpO2 100%   Physical Exam  Constitutional: She is oriented to person, place, and time. She appears well-developed and well-nourished.  HENT:  Mouth/Throat: Oropharynx is clear and moist.  Eyes: Right eye exhibits no discharge. Left eye exhibits no discharge.  Neck: Normal range of motion. Neck supple.  Cardiovascular: Normal rate, regular rhythm and intact distal pulses.  Exam reveals no gallop and no friction rub.   No murmur heard. Pulmonary/Chest: No respiratory distress. She has no wheezes. She has no rales.  Abdominal: Soft. Bowel sounds are normal. She exhibits no distension and no mass. There is no tenderness. There is no guarding.  Lymphadenopathy:    She has no cervical adenopathy.  Neurological: She is alert and oriented to person, place, and time.  Skin: Skin is warm and dry.   Dg Chest 2 View  Result Date: 05/19/2017 CLINICAL DATA:  Cough for 4 days, RIGHT side chest pain EXAM: CHEST  2 VIEW COMPARISON:   10/05/2016 ; correlation CT chest 11/11/2006 FINDINGS: Upper normal heart size. Mediastinal contours and pulmonary vascularity normal. Lungs grossly clear. A portion of the LEFT heart border remains ill defined, unchanged since prior exam, corresponding to an area of scarring and bronchiectasis on remote CT. No pleural effusion or pneumothorax. Bones unremarkable. IMPRESSION: No acute abnormalities. Minimal lingular scarring. Electronically Signed   By: Lavonia Dana M.D.   On: 05/19/2017 18:36     Assessment and Plan :   Post-nasal drainage  Cough  Atypical chest pain  Environmental allergies  Will manage cough as secondary to post-nasal drainage, chronic allergies. Start aggressive allergy management. Use cough syrup at night to help avoid persistent night time cough. Return-to-clinic precautions discussed, patient verbalized understanding.   Amy Eagles, PA-C [facility]  05/19/2017  5:49 PM    Amy Eagles, PA-C 05/19/17 1839

## 2017-05-19 NOTE — ED Notes (Signed)
Patient discharged by provider Jaynee Eagles, PA

## 2017-05-20 MED FILL — HYDROCODONE-HOMATROPINE SOL: 5-1.5 | 24 days supply | Qty: 120 | Fill #0

## 2017-05-20 MED FILL — ALL DAY ALLERGY 10 MG TAB: 10 | 30 days supply | Qty: 30 | Fill #0

## 2017-05-20 MED FILL — FLUTICASONE PROP 50 MCG SPR: 50 | 30 days supply | Qty: 16 | Fill #0

## 2017-05-20 MED FILL — SM NASAL DECONGEST ER 120 M: 120 | 15 days supply | Qty: 30 | Fill #0

## 2017-06-06 ENCOUNTER — Emergency Department (HOSPITAL_BASED_OUTPATIENT_CLINIC_OR_DEPARTMENT_OTHER)
Admission: EM | Admit: 2017-06-06 | Discharge: 2017-06-06 | Disposition: A | Discharge status: Home / Self Care | Payer: 59 | Attending: Emergency Medicine | Admitting: Emergency Medicine

## 2017-06-06 ENCOUNTER — Encounter (HOSPITAL_BASED_OUTPATIENT_CLINIC_OR_DEPARTMENT_OTHER): Payer: Self-pay | Admitting: Emergency Medicine

## 2017-06-06 ENCOUNTER — Emergency Department (HOSPITAL_BASED_OUTPATIENT_CLINIC_OR_DEPARTMENT_OTHER): Payer: 59

## 2017-06-06 DIAGNOSIS — Z79899 Other long term (current) drug therapy: Secondary | ICD-10-CM | POA: Insufficient documentation

## 2017-06-06 DIAGNOSIS — I350 Nonrheumatic aortic (valve) stenosis: Secondary | ICD-10-CM | POA: Insufficient documentation

## 2017-06-06 DIAGNOSIS — R059 Cough, unspecified: Secondary | ICD-10-CM

## 2017-06-06 DIAGNOSIS — J189 Pneumonia, unspecified organism: Secondary | ICD-10-CM | POA: Insufficient documentation

## 2017-06-06 DIAGNOSIS — R079 Chest pain, unspecified: Secondary | ICD-10-CM | POA: Diagnosis not present

## 2017-06-06 DIAGNOSIS — R05 Cough: Secondary | ICD-10-CM | POA: Diagnosis not present

## 2017-06-06 DIAGNOSIS — J181 Lobar pneumonia, unspecified organism: Secondary | ICD-10-CM

## 2017-06-06 MED ORDER — LEVOFLOXACIN 750 MG PO TABS: 750.0000 mg | ORAL_TABLET | Freq: Every day | ORAL | 0 refills | Status: DC

## 2017-06-06 MED ORDER — IBUPROFEN 600 MG PO TABS: 600.0000 mg | ORAL_TABLET | Freq: Four times a day (QID) | ORAL | 0 refills | Status: DC | PRN

## 2017-06-06 MED FILL — IBUPROFEN 600 MG TABLET: 600 | 5 days supply | Qty: 20 | Fill #0

## 2017-06-06 MED FILL — levoFLOXacin 750 MG TABS: 750 | 7 days supply | Qty: 7 | Fill #0

## 2017-06-06 NOTE — Discharge Instructions (Signed)
Please read and follow all provided instructions.  Your diagnoses today include:  1. Community acquired pneumonia of right lower lobe of lung ([place])   2. Cough     Tests performed today include:  Chest x-ray -- shows possible early pneumonia in the right base  Vital signs. See below for your results today.   Medications prescribed:   Levofloxacin - antibiotic  You have been prescribed an antibiotic medicine: take the entire course of medicine even if you are feeling better. Stopping early can cause the antibiotic not to work.   Ibuprofen (Motrin, Advil) - anti-inflammatory pain medication  Do not exceed 600mg  ibuprofen every 6 hours, take with food  You have been prescribed an anti-inflammatory medication or NSAID. Take with food. Take smallest effective dose for the shortest duration needed for your pain. Stop taking if you experience stomach pain or vomiting.   Take any prescribed medications only as directed.  Home care instructions:  Follow any educational materials contained in this packet.  Take the complete course of antibiotics that you were prescribed.   BE VERY CAREFUL not to take multiple medicines containing Tylenol (also called acetaminophen). Doing so can lead to an overdose which can damage your liver and cause liver failure and possibly death.   Follow-up instructions: Please follow-up with your primary care provider in the next 3 days for further evaluation of your symptoms and to ensure resolution of your infection.   Return instructions:   Please return to the Emergency Department if you experience worsening symptoms.   Return immediately with worsening breathing, worsening shortness of breath, or if you feel it is taking you more effort to breathe.   Please return if you have any other emergent concerns.  Additional Information:  Your vital signs today were: BP 105/70 (BP Location: Left Arm)    Pulse 82    Temp 98.5 F (36.9 C) (Oral)    Resp 20     Ht 5' 1.75" (1.568 m)    Wt 63.5 kg (140 lb)    LMP 05/23/2017 (Approximate)    SpO2 100%    BMI 25.81 kg/m  If your blood pressure (BP) was elevated above 135/85 this visit, please have this repeated by your doctor within one month. --------------

## 2017-06-06 NOTE — ED Triage Notes (Signed)
Patient reports chest and back pain which began 1 week ago.  Seen at [facility] for this and had negative xrays.  States woke up this morning with worsening pain.  Denies shortness of breath, nausea.

## 2017-06-06 NOTE — ED Provider Notes (Signed)
[facility] EMERGENCY DEPARTMENT Provider Note   CSN: [id number] Arrival date & time: 06/06/17  1552     History   Chief Complaint Chief Complaint  Patient presents with  . Chest Pain    HPI Amy Terry is a 46 y.o. female.  Patient with history of mild aortic stenosis demonstrated on echo earlier this year, plan to repeat echo in 1 year --presents with complaint of chest pain and cough.  Patient's symptoms have been ongoing for approximately 2 and half weeks.  Patient was seen at urgent care and had an x-ray that was negative.  She was treated with Zyrtec, decongestant, cough medication.  Symptoms have been waxing and waning and intermittent.  Today she woke up at 2 PM with more severe pain extending from her sternum and into her back.  She has had no associated fevers.  No nausea, vomiting or diaphoresis.  Pain does not radiate.  It is mildly worse with palpation and deep breathing. Patient denies risk factors for pulmonary embolism including: unilateral leg swelling, history of DVT/PE/other blood clots, use of exogenous hormones, recent immobilizations, recent surgery, recent travel (>4hr segment), malignancy, hemoptysis.  Patient denies history of hypertension, high cholesterol, diabetes, smoking.  No family history of coronary artery disease.  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      Past Medical History:  Diagnosis Date  . Allergy   . Heart murmur   . Pneumonia   . Sinusitis   . UTI (urinary tract infection)     Patient Active Problem List   Diagnosis Date Noted  . Iron deficiency anemia 07/26/2014  . Palpitations 07/21/2014  . Allergic rhinitis 07/21/2014    Past Surgical History:  Procedure Laterality Date  . CESAREAN SECTION    . CESAREAN SECTION    . TUBAL LIGATION      OB History    No data available       Home Medications    Prior to Admission medications   Medication Sig Start Date  End Date Taking? Authorizing Provider  azithromycin (ZITHROMAX) 250 MG tablet Take 1 tablet (250 mg total) by mouth daily. Take first 2 tablets together, then 1 every day until finished. 10/05/16   Barry Dienes, NP  cetirizine (ZYRTEC ALLERGY) 10 MG tablet Take 1 tablet (10 mg total) by mouth daily. 05/19/17   Jaynee Eagles, PA-C  fluticasone (FLONASE) 50 MCG/ACT nasal spray Place 2 sprays into both nostrils daily. 05/19/17   Jaynee Eagles, PA-C  HYDROcodone-homatropine [facility]) 5-1.5 MG/5ML syrup Take 5 mLs by mouth at bedtime as needed. 05/19/17   Jaynee Eagles, PA-C  OVER THE COUNTER MEDICATION Prescription weight loss medication    [provider]  propranolol (INDERAL) 10 MG tablet Take 1 tablet (10 mg total) by mouth daily as needed. 09/18/16   Josue Hector, MD  Pseudoeph-Doxylamine-DM-APAP (NYQUIL PO) Take by mouth.    [provider]  pseudoephedrine (SUDAFED 12 HOUR) 120 MG 12 hr tablet Take 1 tablet (120 mg total) by mouth 2 (two) times daily. 05/19/17   Jaynee Eagles, PA-C    Family History Family History  Problem Relation Age of Onset  . Hypertension Mother   . Emphysema Mother   . Hypertension Father   . Thyroid disease Sister   . Breast cancer Sister   . Hyperlipidemia Brother   . Thyroid disease Paternal Aunt   . Thyroid disease Sister   . Asthma Sister   .  Anemia Sister   . Thyroid disease Sister     Social History Social History  Substance Use Topics  . Smoking status: Never Smoker  . Smokeless tobacco: Never Used  . Alcohol use No     Allergies   Patient has no known allergies.   Review of Systems Review of Systems  Constitutional: Negative for fever.  HENT: Positive for congestion. Negative for rhinorrhea and sore throat.   Eyes: Negative for redness.  Respiratory: Positive for cough.   Cardiovascular: Positive for chest pain. Negative for leg swelling.  Gastrointestinal: Negative for abdominal pain, diarrhea, nausea and vomiting.    Genitourinary: Negative for dysuria.  Musculoskeletal: Negative for myalgias.  Skin: Negative for rash.  Neurological: Negative for headaches.     Physical Exam Updated Vital Signs BP 105/70 (BP Location: Left Arm)   Pulse 82   Temp 98.5 F (36.9 C) (Oral)   Resp 20   Ht 5' 1.75" (1.568 m)   Wt 63.5 kg (140 lb)   LMP 05/23/2017 (Approximate)   SpO2 100%   BMI 25.81 kg/m   Physical Exam  Constitutional: She appears well-developed and well-nourished.  HENT:  Head: Normocephalic and atraumatic.  Right Ear: External ear normal.  Left Ear: External ear normal.  Nose: Mucosal edema present. No rhinorrhea.  Mouth/Throat: Uvula is midline, oropharynx is clear and moist and mucous membranes are normal. Mucous membranes are not dry.  Eyes: Conjunctivae are normal. Right eye exhibits no discharge. Left eye exhibits no discharge.  Neck: Trachea normal and normal range of motion. Neck supple. Normal carotid pulses and no JVD present. No muscular tenderness present. Carotid bruit is not present. No tracheal deviation present.  Cardiovascular: Normal rate, regular rhythm, S1 normal, S2 normal and intact distal pulses.  Exam reveals no decreased pulses.   Murmur (2/6 systolic murmur, upper left sternal border.) heard. Pulmonary/Chest: Effort normal and breath sounds normal. No respiratory distress. She has no wheezes. She exhibits tenderness.  Abdominal: Soft. Normal aorta and bowel sounds are normal. There is no tenderness. There is no rebound and no guarding.  Musculoskeletal: Normal range of motion. She exhibits no edema.  No clinical signs of DVT.  Neurological: She is alert.  Skin: Skin is warm and dry. She is not diaphoretic. No cyanosis. No pallor.  Psychiatric: She has a normal mood and affect.  Nursing note and vitals reviewed.    ED Treatments / Results  Labs (all labs ordered are listed, but only abnormal results are displayed) Labs Reviewed - No data to display  EKG   EKG Interpretation  Date/Time:  Friday June 06 2017 16:00:25 EDT Ventricular Rate:  76 PR Interval:    QRS Duration: 79 QT Interval:  386 QTC Calculation: 434 R Axis:   84 Text Interpretation:  Sinus rhythm Confirmed by Julianne Rice [phone]) on 06/06/2017 4:17:57 PM       Radiology Dg Chest 2 View  Result Date: 06/06/2017 CLINICAL DATA:  Initial evaluation for acute midsternal chest pain, cough. EXAM: CHEST  2 VIEW COMPARISON:  Prior radiograph from 05/19/2017. FINDINGS: Cardiac mediastinal silhouettes are stable in size and contour, and remain within normal limits. Lungs normally inflated. Mild lingular scarring, stable from previous. There is subtly increased patchy density at the right lung base as compared to previous, which may reflect subsegmental atelectasis or possibly developing infiltrate. No other focal airspace disease. No pulmonary edema or pleural effusion. No pneumothorax. No acute osseus abnormality. IMPRESSION: 1. Minimally increased patchy density at the right  lung base as compared to previous. Finding favored to reflect mild subsegmental atelectatic changes, although a small developing infiltrate could be considered in the correct clinical setting. 2. Otherwise stable radiograph of the chest with no other active cardiopulmonary disease identified. Electronically Signed   By: Jeannine Boga M.D.   On: 06/06/2017 17:03    Procedures Procedures (including critical care time)  Medications Ordered in ED Medications - No data to display   Initial Impression / Assessment and Plan / ED Course  I have reviewed the triage vital signs and the nursing notes.  Pertinent labs & imaging results that were available during my care of the patient were reviewed by me and considered in my medical decision making (see chart for details).     4:15 PM EKG reviewed.   4:43 PM patient seen and examined.  Chest x-ray ordered.  Discussed results of EKG.  5:29 PM patient  updated on x-ray results, possible early pneumonia.  Given that her symptoms are worsening, will treat for pneumonia.  Patient is in agreement.  She has had treatment failure in the past with azithromycin.  For this reason levofloxacin was prescribed.  Patient encouraged to return to the emergency department with worsening pain, hemoptysis, shortness of breath, fevers, or other concerns.  She verbalizes understanding and agrees with plan.  Final Clinical Impressions(s) / ED Diagnoses   Final diagnoses:  Community acquired pneumonia of right lower lobe of lung ([place])  Cough   Patient with intermittent chest pains and cough.  Her symptoms would be very atypical for ACS.  She has not had any exertional pain, diaphoresis, radiation of pain.  Patient is PERC negative without clinical signs of DVT.  Chest x-ray shows possible early right basilar pneumonia versus atelectasis.  Given clinical course, will treat.  Patient is otherwise well-appearing, not hypoxic, not tachycardic.  Feel safe for discharge home at this time.    New Prescriptions New Prescriptions   IBUPROFEN (ADVIL,MOTRIN) 600 MG TABLET    Take 1 tablet (600 mg total) by mouth every 6 (six) hours as needed.   LEVOFLOXACIN (LEVAQUIN) 750 MG TABLET    Take 1 tablet (750 mg total) by mouth daily.     Carlisle Cater, PA-C 06/06/17 1731    Julianne Rice, MD 06/08/17 [phone]

## 2017-07-16 DIAGNOSIS — D3132 Benign neoplasm of left choroid: Secondary | ICD-10-CM | POA: Diagnosis not present

## 2017-07-16 DIAGNOSIS — H2513 Age-related nuclear cataract, bilateral: Secondary | ICD-10-CM | POA: Diagnosis not present

## 2017-07-16 DIAGNOSIS — H40013 Open angle with borderline findings, low risk, bilateral: Secondary | ICD-10-CM | POA: Diagnosis not present

## 2017-07-16 DIAGNOSIS — H5201 Hypermetropia, right eye: Secondary | ICD-10-CM | POA: Diagnosis not present

## 2017-07-16 DIAGNOSIS — H5202 Hypermetropia, left eye: Secondary | ICD-10-CM | POA: Diagnosis not present

## 2017-07-16 DIAGNOSIS — H524 Presbyopia: Secondary | ICD-10-CM | POA: Diagnosis not present

## 2017-07-16 DIAGNOSIS — H52222 Regular astigmatism, left eye: Secondary | ICD-10-CM | POA: Diagnosis not present

## 2017-08-20 DIAGNOSIS — Z6827 Body mass index (BMI) 27.0-27.9, adult: Secondary | ICD-10-CM | POA: Diagnosis not present

## 2017-08-20 DIAGNOSIS — Z3040 Encounter for surveillance of contraceptives, unspecified: Secondary | ICD-10-CM | POA: Diagnosis not present

## 2017-08-20 DIAGNOSIS — Z1231 Encounter for screening mammogram for malignant neoplasm of breast: Secondary | ICD-10-CM | POA: Diagnosis not present

## 2017-08-20 DIAGNOSIS — N838 Other noninflammatory disorders of ovary, fallopian tube and broad ligament: Secondary | ICD-10-CM | POA: Diagnosis not present

## 2017-08-20 DIAGNOSIS — Z01419 Encounter for gynecological examination (general) (routine) without abnormal findings: Secondary | ICD-10-CM | POA: Diagnosis not present

## 2017-09-10 DIAGNOSIS — N838 Other noninflammatory disorders of ovary, fallopian tube and broad ligament: Secondary | ICD-10-CM | POA: Diagnosis not present

## 2017-09-11 DIAGNOSIS — R1900 Intra-abdominal and pelvic swelling, mass and lump, unspecified site: Secondary | ICD-10-CM | POA: Diagnosis not present

## 2017-10-13 MED FILL — SM NASAL DECONGEST ER 120 M: 120 | 15 days supply | Qty: 30 | Fill #1

## 2017-10-13 MED FILL — FLUTICASONE PROP 50 MCG SPR: 50 | 30 days supply | Qty: 16 | Fill #1

## 2017-11-15 IMAGING — MR MR LUMBAR SPINE W/O CM
4 of 5 series · 19 of 48 positions shown · non-contrast
Comparison: Cardiac MRI 12/21/2014.  Chest CT 11/11/2006.

CLINICAL DATA: 45-year-old female with H1O-3UD spondyloarthropathy.
Initial encounter.

EXAM:
MRI LUMBAR SPINE WITHOUT CONTRAST
TECHNIQUE: Multiplanar, multisequence MR imaging of the lumbar spine was
performed. No intravenous contrast was administered.

[Series 6: T1 · axial · 4.0mm · 0.39mm/px · z∈[-26,+131]mm · 3 of 36 slices shown (1 of 2)]
[im 5/36]
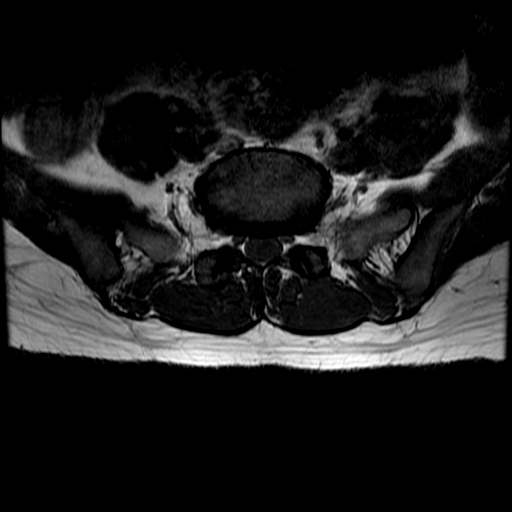
[im 18/36]
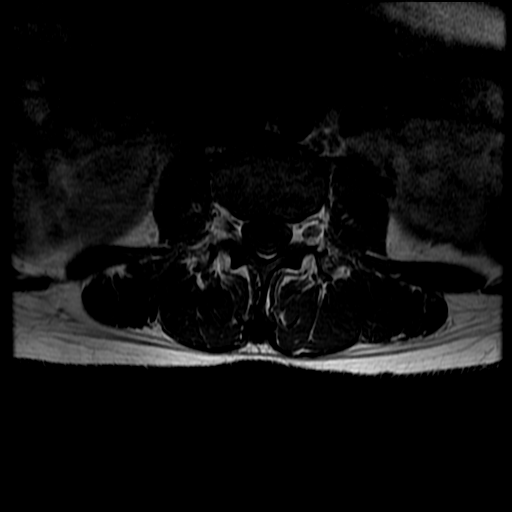
[im 31/36]
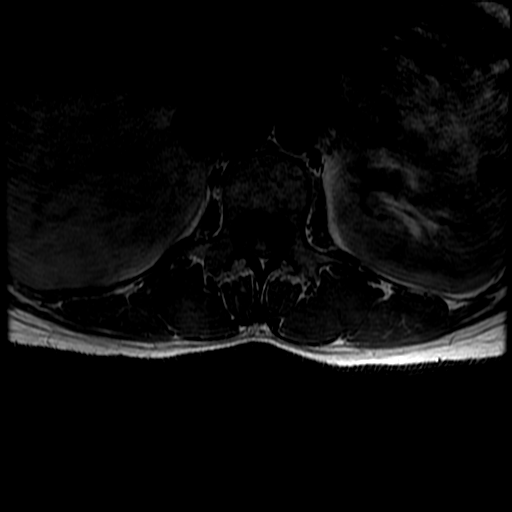

[Series 200: T2 · sagittal · 4.0mm · 0.55mm/px · 5 of 12 slices shown (1 of 2)]
[im 1/12]
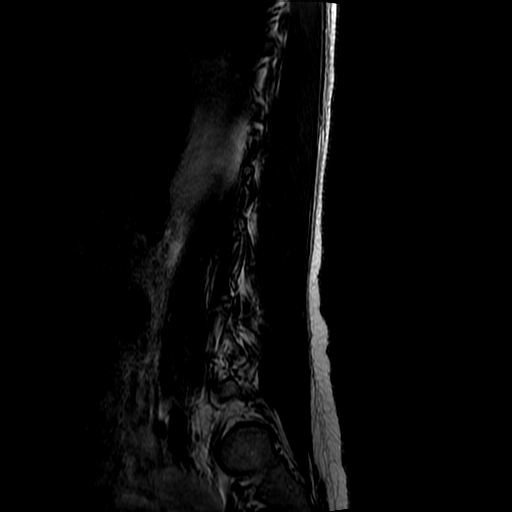
[im 3/12]
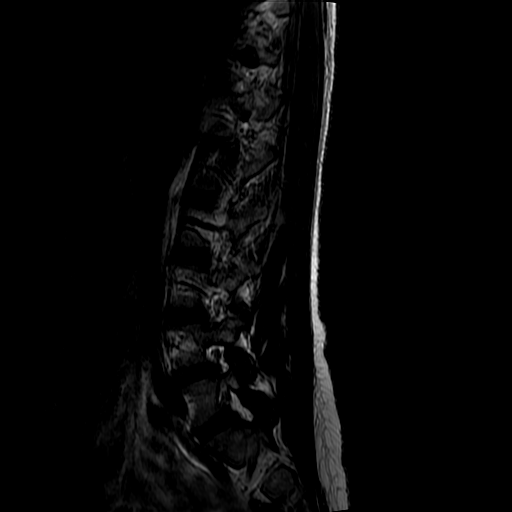
[im 6/12]
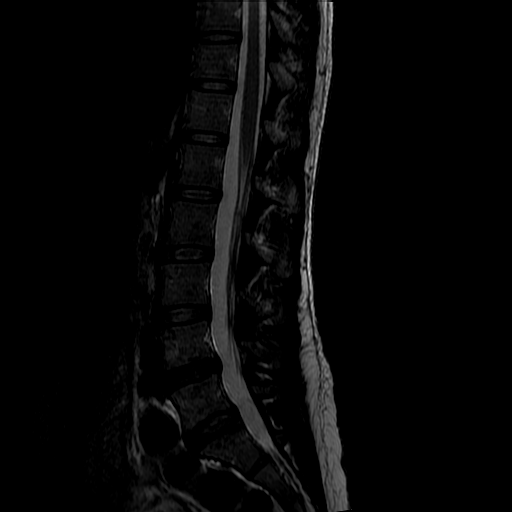
[im 9/12]
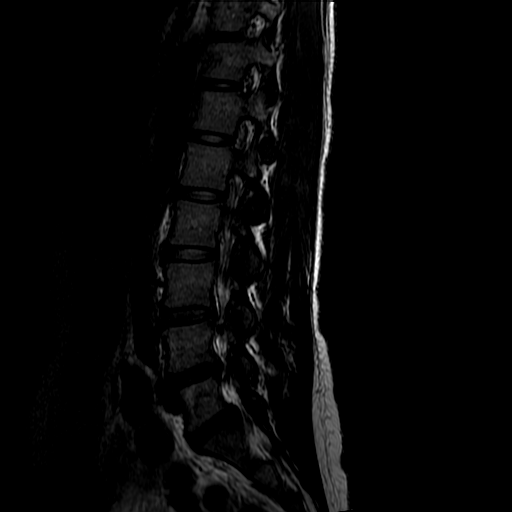
[im 12/12]
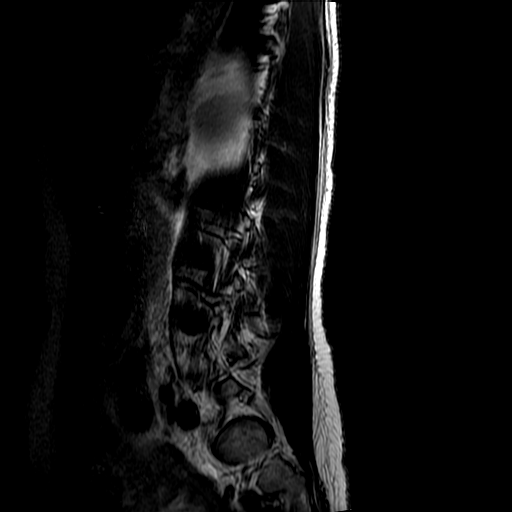

[Series 400: T1 · sagittal · 4.0mm · 0.55mm/px · 3 of 12 slices shown (2 of 2)]
[im 1/12]
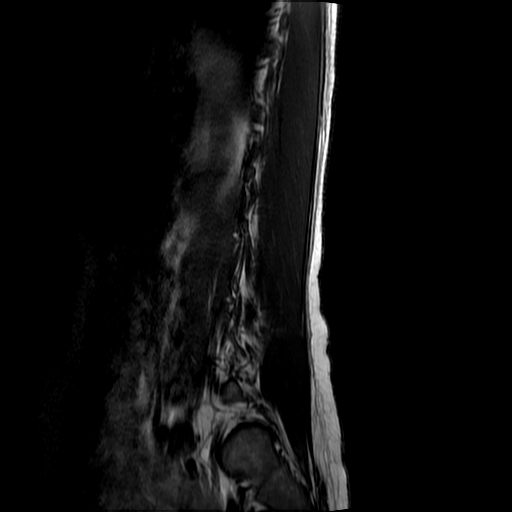
[im 6/12]
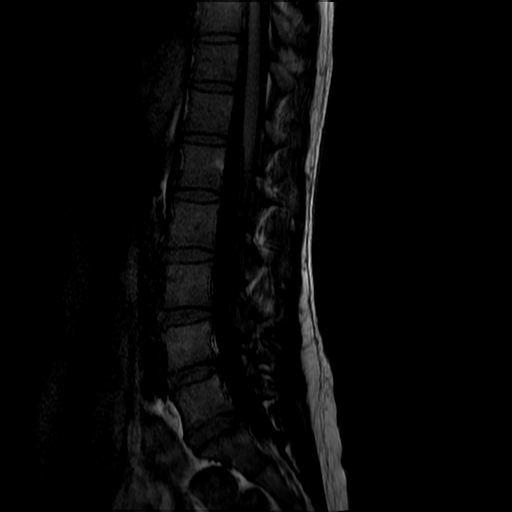
[im 12/12]
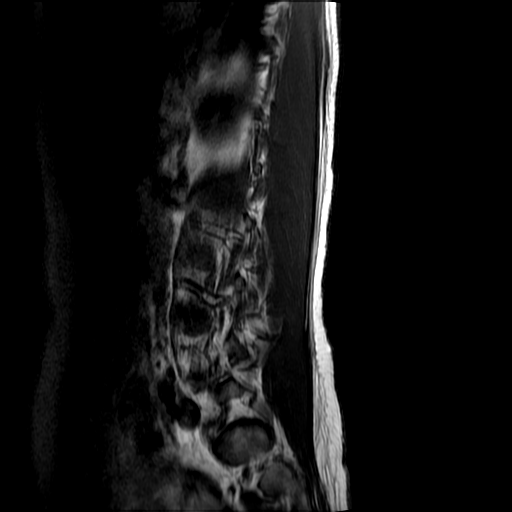

[Series 500: T2 · axial · 4.0mm · 0.39mm/px · z∈[-36,+131]mm · 8 of 36 slices shown (2 of 2)]
[im 3/36]
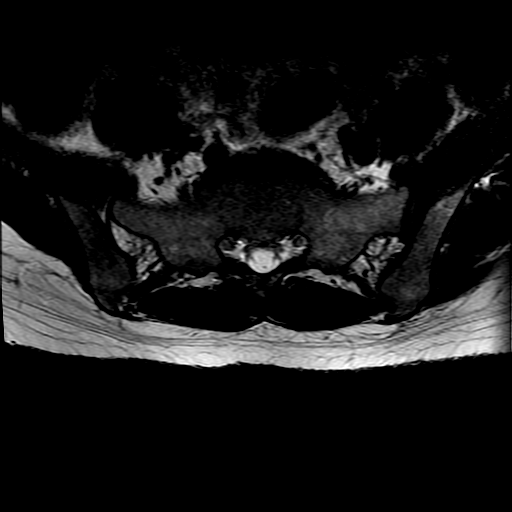
[im 5/36]
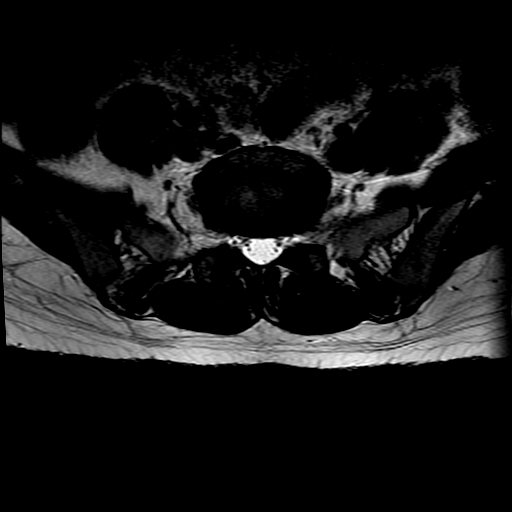
[im 8/36]
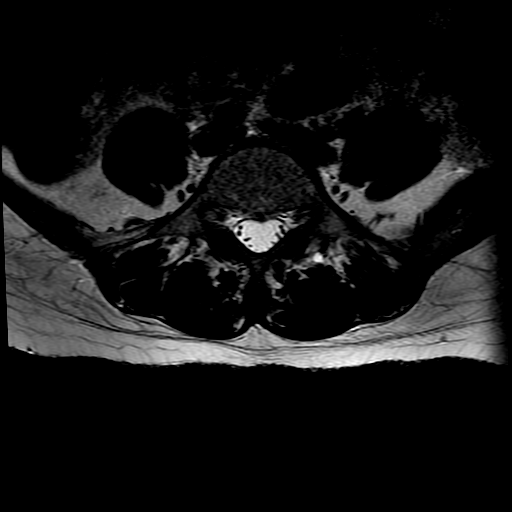
[im 12/36]
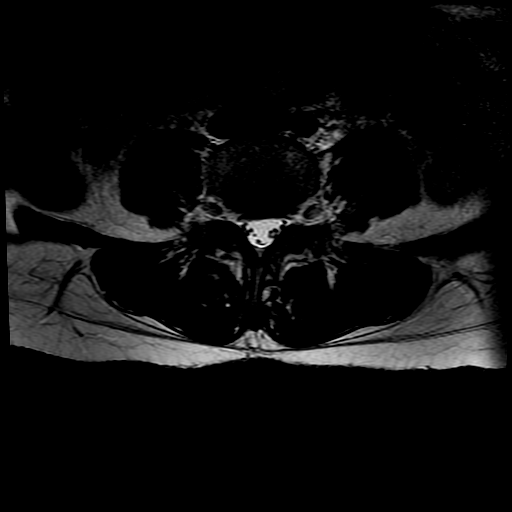
[im 17/36]
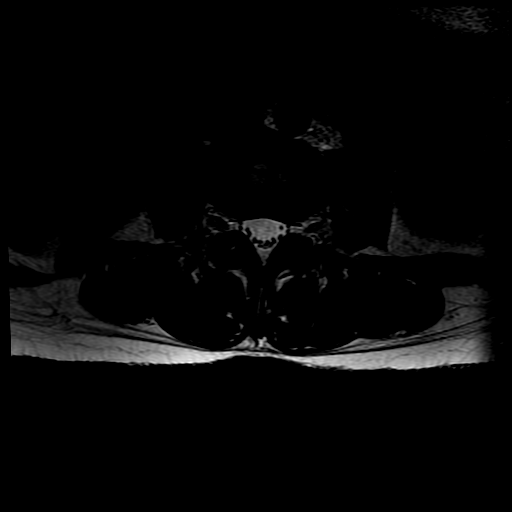
[im 19/36]
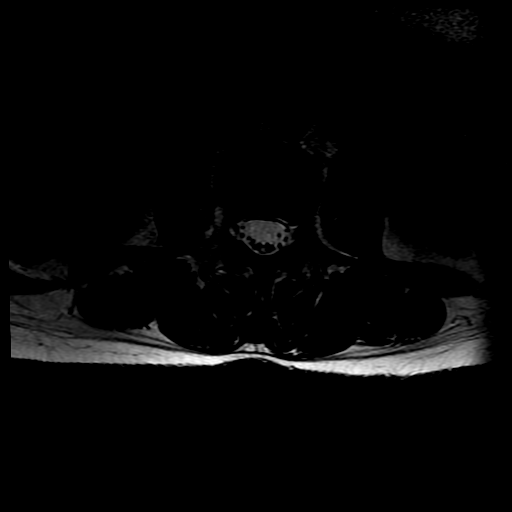
[im 22/36]
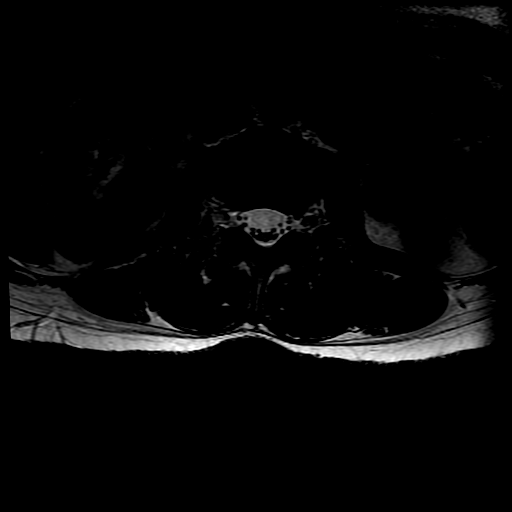
[im 31/36]
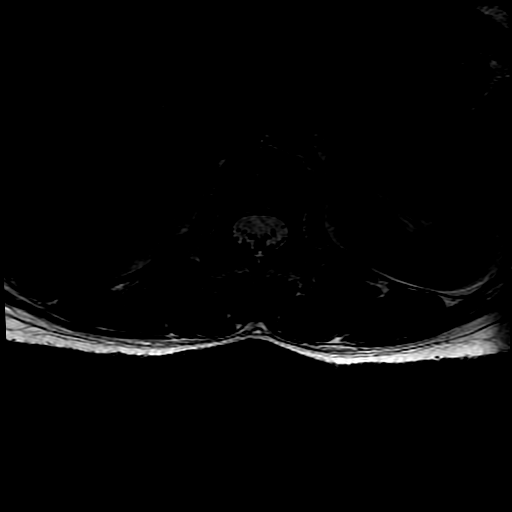

[19 of 48 positions shown; findings below may reference images not displayed]

FINDINGS: Lumbar segmentation appears to be normal and will be designated as
such for this report.

The visible SI joints appear to remain normal and open, without
ankylosis (series 500, image 36). Lumbar and lower thoracic
vertebral height and alignment is normal aside from minimal
levoconvex scoliosis. No marrow edema or evidence of acute osseous
abnormality. Visible costovertebral junctions appear normal.

Visualized lower thoracic spinal cord is normal with conus medularis
at L1.

Visualized abdominal viscera and paraspinal soft tissues are within
normal limits.

No evidence of spinal degeneration above L2.

L2-L3:  Mild facet hypertrophy.  No stenosis.

L3-L4:  Borderline to mild facet hypertrophy.  No stenosis.

L4-L5: Disc desiccation and circumferential disc bulge. Superimposed
broad-based right subarticular disc protrusion best seen on series
200, image 4. Mild facet hypertrophy. Mild to moderate right lateral
recess stenosis (descending right L5 nerve root level). Borderline
to mild right L5 foraminal stenosis. No spinal stenosis.

L5-S1:  Mild facet hypertrophy.  No stenosis.
IMPRESSION: 1. No evidence of lumbar spine or superior SI joint ankylosis. No
acute osseous abnormality identified.
2. Mild for age lumbar spine degeneration, primarily at L4-L5 and
affecting the right lateral recess.

## 2018-03-10 NOTE — Progress Notes (Deleted)
Patient ID: Amy Terry, female   DOB: 10-Apr-1971, 47 y.o.   MRN: [id number]     Cardiology Office Note   Date:  03/10/2018   ID:  Amy Terry, DOB Feb 08, 1971, MRN [id number]  PCP:  Lanice Shirts, MD  Cardiologist:   [name], MD   No chief complaint on file.     History of Present Illness:  47 y.o. first seen 2016 for palpitations and dizziness Flip flops worse when leaning over. Works nights on [employer]  Some stress.  No excess ETOH/ caffeine or stimulants.  Strong family history of thyroid issues in sisters No syncope or chest pain.  Use to due Zumba but when last tried  felt dyspnea and light headedness  Dizzy symptoms due seem to be with change in position  Labs reviewed TSH and Hct normal   F/U echo reviewed  2016  Mean gradient 15 mmHg peak 26 mmgh Bicuspid valve likely aortic root mildly dilated but measurement indicates 34 mm  Impressions:  3.25.16    - Normal LV size and systolic function, EF 23-76%. Normal diastolic function. Normal RV size and systolic function. The aortic valve is abnormal, suspect bicuspid, but cannot visualize the leaflets adequately enough on this study to be definitive. There is mild aortic stenosis. The ascending aorta is very mildly dilated.  Event monitor:   Reviewed from  April  Just NSR no arrhythmia  (over 50 pages)  She indicates still with palpitations and she did not have them when she had monitor  Cardiac MRI 12/21/14   Bicuspid AV EF 70% meand gradient 15 mmHg Root 3.6 cm   ***   Past Medical History:  Diagnosis Date  . Allergy   . Heart murmur   . Pneumonia   . Sinusitis   . UTI (urinary tract infection)     Past Surgical History:  Procedure Laterality Date  . CESAREAN SECTION    . CESAREAN SECTION    . TUBAL LIGATION       Current Outpatient Medications  Medication Sig Dispense Refill  . cetirizine (ZYRTEC ALLERGY) 10 MG tablet Take 1 tablet (10 mg total) by mouth daily. 30 tablet 0  .  fluticasone (FLONASE) 50 MCG/ACT nasal spray Place 2 sprays into both nostrils daily. 16 g 12  . HYDROcodone-homatropine (HYCODAN) 5-1.5 MG/5ML syrup Take 5 mLs by mouth at bedtime as needed. 120 mL 0  . ibuprofen (ADVIL,MOTRIN) 600 MG tablet Take 1 tablet (600 mg total) by mouth every 6 (six) hours as needed. 20 tablet 0  . levofloxacin (LEVAQUIN) 750 MG tablet Take 1 tablet (750 mg total) by mouth daily. 7 tablet 0  . OVER THE COUNTER MEDICATION Prescription weight loss medication    . propranolol (INDERAL) 10 MG tablet Take 1 tablet (10 mg total) by mouth daily as needed. 30 tablet 2  . Pseudoeph-Doxylamine-DM-APAP (NYQUIL PO) Take by mouth.    . pseudoephedrine (SUDAFED 12 HOUR) 120 MG 12 hr tablet Take 1 tablet (120 mg total) by mouth 2 (two) times daily. 30 tablet 3   No current facility-administered medications for this visit.     Allergies:   Patient has no known allergies.    Social History:  The patient  reports that she has never smoked. She has never used smokeless tobacco. She reports that she does not drink alcohol or use drugs.   Family History:  The patient's family history includes Anemia in her sister; Asthma in her sister; Breast cancer in her  sister; Emphysema in her mother; Hyperlipidemia in her brother; Hypertension in her father and mother; Thyroid disease in her paternal aunt, sister, sister, and sister.    ROS:  Please see the history of present illness.   Otherwise, review of systems are positive for painful urination    All other systems are reviewed and negative.     PHYSICAL EXAM: VS:  There were no vitals taken for this visit. , BMI There is no height or weight on file to calculate BMI. GEN: Well nourished, well developed, in no acute distress  HEENT: normal  Neck: no JVD, carotid bruits, or masses Cardiac:  RRR; SEM accentuates with valsalva no , rubs, or gallops,no edema  Respiratory:  clear to auscultation bilaterally, normal work of breathing GI: soft,  nontender, nondistended, + BS MS: no deformity or atrophy  Skin: warm and dry, no rash Neuro:  Strength and sensation are intact Psych: euthymic mood, full affect   EKG:   07/21/14   SR rate 82 normal ECG  09/18/16 SR rate 77 LAE otherwise normal    Recent Labs: No results found for requested labs within last 8760 hours.    Lipid Panel    Component Value Date/Time   CHOL 156 07/21/2014 1343   TRIG 42 07/21/2014 1343   HDL 68 07/21/2014 1343   CHOLHDL 2.3 07/21/2014 1343   VLDL 8 07/21/2014 1343   LDLCALC 80 07/21/2014 1343      Wt Readings from Last 3 Encounters:  06/06/17 140 lb (63.5 kg)  09/18/16 134 lb 1.9 oz (60.8 kg)  01/25/16 135 lb (61.2 kg)      Other studies Reviewed: Additional studies/ records that were reviewed today include: Epic records TTE 2016 Holter 2016 and cardiac MRI 2016 .    ASSESSMENT AND PLAN:  1.  Palpitations- benign nothing on event monitor normal ECG and EF by echo Hct/TSH normal PRN inderal repeat monitor when symptomatic in future  2. Murmur  Mild AS with bicuspid AV aortic root 3.6 cm MRA aorta ok  MRA head no aneursyms Needs updated echo ordered    Current medicines are reviewed at length with the patient today.  The patient does not have concerns regarding medicines.  The following changes have been made:     Labs/ tests ordered today include:  Echo   No orders of the defined types were placed in this encounter.    Disposition:   FU with me in a year     Signed, [name], MD  03/10/2018 4:04 PM    [facility] [place], [place], [place]  [address] Phone: [phone]; Fax: [phone]

## 2018-03-13 ENCOUNTER — Ambulatory Visit: Payer: 59 | Admitting: Cardiovascular Disease

## 2018-06-02 NOTE — Progress Notes (Signed)
Cardiology Office Note   Date:  06/04/2018   ID:  Amy Terry, DOB 1970/10/01, MRN [id number]   PCP:  [name], MD  Cardiologist:  Dr Johnsie Cancel    Chief Complaint  Patient presents with  . Palpitations      History of Present Illness: Amy Terry is a 46 y.o. female who presents for bicuspid aortic valve.  Hx of palpitations that are stable currently.    Previously: Palpitations and dizzyness when leaning forward She has had this over the last year.  Palpitations 1-2x/week  Flip flops No prolonged episodes  Occur at home or work  Works nights on 6[employer]  Some stress.  No excess ETOH/ caffeine or stimulants.  Strong family history of thyroid issues in sisters No syncope or chest pain.  Use to due Zumba but when last tried 3 months ago felt dyspnea and light headedness  Dizzy symptoms due seem to be with change in position  Labs reviewed TSH and Hct normal   F/U echo 2016reviewed  Mean gradient 15 mmHg peak 26 mmgh Bicuspid valve likely aortic root mildly dilated but measurement indicates 34 mm Impressions:  3.25.16    - Normal LV size and systolic function, EF 70-01%. Normal diastolic function. Normal RV size and systolic function. The aortic valve is abnormal, suspect bicuspid, but cannot visualize the leaflets adequately enough on this study to be definitive. There is mild aortic stenosis. The ascending aorta is very mildly dilated.  Event monitor:   Reviewed from  April  Just NSR no arrhythmia  (over 50 pages)  She indicates still with palpitations and she did not have them when she had monitor  Discussed diagnosis of bicuspid AV and association with aortopathy and need for serial f/u and r/o aneurysms.     Cardiac MRI 12/21/14   Bicuspid AV EF 70% meand gradient 15 mmHg Root 3.6 cm   today she has noticed increased dyspnea and chest pain with Zumba, she can hardly do this anymore.  Though with normal ADLs no pain.  No syncope, no  lightheadedness.  VS are stable.        Past Medical History:  Diagnosis Date  . Allergy   . Heart murmur   . Pneumonia   . Sinusitis   . UTI (urinary tract infection)     Past Surgical History:  Procedure Laterality Date  . CESAREAN SECTION    . CESAREAN SECTION    . TUBAL LIGATION       Current Outpatient Medications  Medication Sig Dispense Refill  . brompheniramine-pseudoephedrine-DM 30-2-10 MG/5ML syrup Take 5 mLs by mouth every 4 (four) hours as needed.    . cetirizine (ZYRTEC ALLERGY) 10 MG tablet Take 1 tablet (10 mg total) by mouth daily. 30 tablet 0  . fluticasone (FLONASE) 50 MCG/ACT nasal spray Place 2 sprays into both nostrils daily. 16 g 12  . ibuprofen (ADVIL,MOTRIN) 600 MG tablet Take 1 tablet (600 mg total) by mouth every 6 (six) hours as needed. 20 tablet 0  . propranolol (INDERAL) 10 MG tablet Take 1 tablet (10 mg total) by mouth daily as needed. 30 tablet 2  . Pseudoeph-Doxylamine-DM-APAP (NYQUIL PO) Take by mouth.    . pseudoephedrine (SUDAFED 12 HOUR) 120 MG 12 hr tablet Take 1 tablet (120 mg total) by mouth 2 (two) times daily. 30 tablet 3   No current facility-administered medications for this visit.     Allergies:   Patient has no known allergies.  Social History:  The patient  reports that she has never smoked. She has never used smokeless tobacco. She reports that she does not drink alcohol or use drugs.   Family History:  The patient's family history includes Anemia in her sister; Asthma in her sister; Breast cancer in her sister; Emphysema in her mother; Hyperlipidemia in her brother; Hypertension in her father and mother; Thyroid disease in her paternal aunt, sister, sister, and sister.    ROS:  General:no colds or fevers, no weight changes Skin:no rashes or ulcers HEENT:no blurred vision, no congestion CV:see HPI PUL:see HPI GI:no diarrhea constipation or melena, no indigestion GU:no hematuria, no dysuria MS:no joint pain, no  claudication Neuro:no syncope, no lightheadedness Endo:no diabetes, no thyroid disease  Wt Readings from Last 3 Encounters:  06/04/18 159 lb 1.9 oz (72.2 kg)  06/06/17 140 lb (63.5 kg)  09/18/16 134 lb 1.9 oz (60.8 kg)     PHYSICAL EXAM: VS:  BP 102/70   Pulse 81   Ht 5' 1.75" (1.568 m)   Wt 159 lb 1.9 oz (72.2 kg)   SpO2 99%   BMI 29.34 kg/m  , BMI Body mass index is 29.34 kg/m. General:Pleasant affect, NAD Skin:Warm and dry, brisk capillary refill HEENT:normocephalic, sclera clear, mucus membranes moist Neck:supple, no JVD, no bruits  Heart:S1S2 RRR with 2/6 systolic murmur, no gallup, rub or click Lungs:clear without rales, rhonchi, or wheezes BTD:VVOH, non tender, + BS, do not palpate liver spleen or masses Ext:no lower ext edema, 2+ pedal pulses, 2+ radial pulses Neuro:alert and oriented X 3, MAE, follows commands, + facial symmetry    EKG:  EKG is ordered today. The ekg ordered today demonstrates SR no changes from previous.    Recent Labs: No results found for requested labs within last 8760 hours.    Lipid Panel    Component Value Date/Time   CHOL 156 07/21/2014 1343   TRIG 42 07/21/2014 1343   HDL 68 07/21/2014 1343   CHOLHDL 2.3 07/21/2014 1343   VLDL 8 07/21/2014 1343   LDLCALC 80 07/21/2014 1343       Other studies Reviewed: Additional studies/ records that were reviewed today include: . Echo 2016  Study Conclusions  - Left ventricle: The cavity size was normal. Wall thickness was normal. Systolic function was normal. The estimated ejection fraction was in the range of 55% to 60%. Wall motion was normal; there were no regional wall motion abnormalities. Left ventricular diastolic function parameters were normal. - Aortic valve: Poorly visualized. The aortic valve is abnormal, likely bicuspid, but cannot see leaflets well enough to be definitive. There was mild stenosis. Mean gradient (S): 15 mm Hg. Peak gradient (S): 26 mm  Hg. - Aorta: Aortic root dimension: 24 mm (ED). Ascending aortic diameter: 37 mm (S). - Aortic root: The aortic root was normal in size. - Ascending aorta: The ascending aorta was mildly enlarged. - Mitral valve: There was trivial regurgitation. - Right ventricle: The cavity size was normal. Systolic function was normal. - Tricuspid valve: Peak RV-RA gradient (S): 23 mm Hg. - Pulmonary arteries: PA peak pressure: 26 mm Hg (S). - Inferior vena cava: The vessel was normal in size. The respirophasic diameter changes were in the normal range (>= 50%), consistent with normal central venous pressure.  Impressions:  - Normal LV size and systolic function, EF 60-73%. Normal diastolic function. Normal RV size and systolic function. The aortic valve is abnormal, suspect bicuspid, but cannot visualize the leaflets adequately enough on this  study to be definitive. There is mild aortic stenosis. The ascending aorta is very mildly dilated.  ASSESSMENT AND PLAN:  1.  Bicuspid Ao Valve with increased SOB and chest pain with strenuous activity.  Will check echo to re-eval valve. And discuss with Dr. Johnsie Cancel  Is worried about anemia. Check CBC  Follow up with Dr. Johnsie Cancel in 1 year unless increased pain or issues with echo.    2.  palpitations  stable. Will check BMP.    Current medicines are reviewed with the patient today.  The patient Has no concerns regarding medicines.  The following changes have been made:  See above Labs/ tests ordered today include:see above  Disposition:   FU:  see above  Signed, Cecilie Kicks, NP  06/04/2018 8:37 AM    [facility], [place], [place], [place] Phone: [phone]; Fax: [phone]

## 2018-06-04 ENCOUNTER — Encounter: Payer: Self-pay | Admitting: Cardiology

## 2018-06-04 ENCOUNTER — Ambulatory Visit: Payer: 59 | Admitting: Cardiology

## 2018-06-04 VITALS — BP 102/70 | HR 81 | Ht 61.75 in | Wt 159.1 lb

## 2018-06-04 DIAGNOSIS — R002 Palpitations: Secondary | ICD-10-CM

## 2018-06-04 DIAGNOSIS — Q231 Congenital insufficiency of aortic valve: Secondary | ICD-10-CM

## 2018-06-04 DIAGNOSIS — R011 Cardiac murmur, unspecified: Secondary | ICD-10-CM | POA: Diagnosis not present

## 2018-06-04 LAB — BASIC METABOLIC PANEL
BUN / CREAT RATIO: 17 (ref 9–23)
BUN: 10 mg/dL (ref 6–24)
CO2: 22 mmol/L (ref 20–29)
CREATININE: 0.59 mg/dL (ref 0.57–1.00)
Calcium: 9 mg/dL (ref 8.7–10.2)
Chloride: 103 mmol/L (ref 96–106)
GFR calc non Af Amer: 109 mL/min/{1.73_m2} (ref >59)
GFR, EST AFRICAN AMERICAN: 126 mL/min/{1.73_m2} (ref >59)
Glucose: 89 mg/dL (ref 65–99)
Potassium: 4.3 mmol/L (ref 3.5–5.2)
Sodium: 138 mmol/L (ref 134–144)

## 2018-06-04 LAB — CBC
HEMOGLOBIN: 9.2 g/dL — AB (ref 11.1–15.9)
Hematocrit: 31.8 % — ABNORMAL LOW (ref 34.0–46.6)
MCH: 19.8 pg — AB (ref 26.6–33.0)
MCHC: 28.9 g/dL — ABNORMAL LOW (ref 31.5–35.7)
MCV: 68 fL — AB (ref 79–97)
Platelets: 433 10*3/uL (ref 150–450)
RBC: 4.65 x10E6/uL (ref 3.77–5.28)
RDW: 16.8 % — ABNORMAL HIGH (ref 12.3–15.4)
WBC: 6.5 10*3/uL (ref 3.4–10.8)

## 2018-06-04 NOTE — Patient Instructions (Addendum)
Medication Instructions:  Your physician recommends that you continue on your current medications as directed. Please refer to the Current Medication list given to you today.  If you need a refill on your cardiac medications before your next appointment, please call your pharmacy.   Lab work: TODAY:  BMET & CBC  If you have labs (blood work) drawn today and your tests are completely normal, you will receive your results only by: Marland Kitchen MyChart Message (if you have MyChart) OR . A paper copy in the mail If you have any lab test that is abnormal or we need to change your treatment, we will call you to review the results.  Testing/Procedures: Your physician has requested that you have an echocardiogram within the next 5 days. Echocardiography is a painless test that uses sound waves to create images of your heart. It provides your doctor with information about the size and shape of your heart and how well your heart's chambers and valves are working. This procedure takes approximately one hour. There are no restrictions for this procedure.   Follow-Up: At [facility] you and your health needs are our priority.  As part of our continuing mission to provide you with exceptional heart care, we have created designated Provider Care Teams.  These Care Teams include your primary Cardiologist (physician) and Advanced Practice Providers (APPs -  Physician Assistants and Nurse Practitioners) who all work together to provide you with the care you need, when you need it. You will need a follow up appointment in 1 years.  Please call our office 2 months in advance to schedule this appointment.  You may see Dr. Johnsie Cancel  or one of the following Advanced Practice Providers on your designated Care Team:   Truitt Merle, NP Cecilie Kicks, NP . Kathyrn Drown, NP  Any Other Special Instructions Will Be Listed Below (If Applicable).   Echocardiogram An echocardiogram, or echocardiography, uses sound waves  (ultrasound) to produce an image of your heart. The echocardiogram is simple, painless, obtained within a short period of time, and offers valuable information to your health care provider. The images from an echocardiogram can provide information such as:  Evidence of coronary artery disease (CAD).  Heart size.  Heart muscle function.  Heart valve function.  Aneurysm detection.  Evidence of a past heart attack.  Fluid buildup around the heart.  Heart muscle thickening.  Assess heart valve function.  Tell a health care provider about:  Any allergies you have.  All medicines you are taking, including vitamins, herbs, eye drops, creams, and over-the-counter medicines.  Any problems you or family members have had with anesthetic medicines.  Any blood disorders you have.  Any surgeries you have had.  Any medical conditions you have.  Whether you are pregnant or may be pregnant. What happens before the procedure? No special preparation is needed. Eat and drink normally. What happens during the procedure?  In order to produce an image of your heart, gel will be applied to your chest and a wand-like tool (transducer) will be moved over your chest. The gel will help transmit the sound waves from the transducer. The sound waves will harmlessly bounce off your heart to allow the heart images to be captured in real-time motion. These images will then be recorded.  You may need an IV to receive a medicine that improves the quality of the pictures. What happens after the procedure? You may return to your normal schedule including diet, activities, and medicines, unless your health  care provider tells you otherwise. This information is not intended to replace advice given to you by your health care provider. Make sure you discuss any questions you have with your health care provider. Document Released: 07/26/2000 Document Revised: 03/16/2016 Document Reviewed: 04/05/2013 Elsevier  Interactive Patient Education  2017 [employer].

## 2018-06-05 ENCOUNTER — Other Ambulatory Visit: Payer: Self-pay

## 2018-06-05 ENCOUNTER — Ambulatory Visit (HOSPITAL_COMMUNITY): Payer: 59 | Attending: Cardiology

## 2018-06-05 ENCOUNTER — Telehealth: Payer: Self-pay

## 2018-06-05 DIAGNOSIS — Q231 Congenital insufficiency of aortic valve: Secondary | ICD-10-CM | POA: Diagnosis not present

## 2018-06-05 DIAGNOSIS — R0602 Shortness of breath: Secondary | ICD-10-CM

## 2018-06-05 DIAGNOSIS — R079 Chest pain, unspecified: Secondary | ICD-10-CM

## 2018-06-05 DIAGNOSIS — I350 Nonrheumatic aortic (valve) stenosis: Secondary | ICD-10-CM

## 2018-06-05 DIAGNOSIS — R002 Palpitations: Secondary | ICD-10-CM

## 2018-06-05 MED ORDER — PROPRANOLOL HCL 10 MG PO TABS: 10.0000 mg | ORAL_TABLET | Freq: Every day | ORAL | 2 refills | Status: DC | PRN

## 2018-06-05 MED ORDER — METOPROLOL TARTRATE 50 MG PO TABS: ORAL_TABLET | ORAL | 0 refills | Status: DC

## 2018-06-05 MED FILL — METOPROLOL TARTRATE 50 MG T: 50 | 2 days supply | Qty: 2 | Fill #0

## 2018-06-05 MED FILL — PROPRANOLOL 10 MG TABLET: 10 | 30 days supply | Qty: 30 | Fill #0

## 2018-06-05 NOTE — Telephone Encounter (Signed)
Left message for patient to call back  

## 2018-06-05 NOTE — Telephone Encounter (Signed)
Patient called back. Informed patient of order for CT and went over instructions. Will send message to scheduling to call patient to make an appointment. Patient verbalized understanding. Will mail copy of instructions to patient.   Please arrive at the [facility] main entrance of [facility] at xx:xx AM (30-45 minutes prior to test start time)  [facility] [place], [place] [address] [phone]  Proceed to the [name] Radiology Department (First Floor).  Please follow these instructions carefully (unless otherwise directed):   On the Night Before the Test: . Be sure to Drink plenty of water. . Do not consume any caffeinated/decaffeinated beverages or chocolate 12 hours prior to your test. . Do not take any antihistamines 12 hours prior to your test.  On the Day of the Test: . Drink plenty of water. Do not drink any water within one hour of the test. . Do not eat any food 4 hours prior to the test. . You may take your regular medications prior to the test.  . Take metoprolol (Lopressor) 50 mg  two hours prior to test.   *For Clinical Staff only. Please instruct patient the following:*        -Drink plenty of water       -Hold Furosemide/hydrochlorothiazide morning of the test       -Take metoprolol (Lopressor) 50 mg  2 hours prior to test (if applicable).      After the Test: . Drink plenty of water. . After receiving IV contrast, you may experience a mild flushed feeling. This is normal. . On occasion, you may experience a mild rash up to 24 hours after the test. This is not dangerous. If this occurs, you can take Benadryl 25 mg and increase your fluid intake. . If you experience trouble breathing, this can be serious. If it is severe call 911 IMMEDIATELY. If it is mild, please call our office.

## 2018-06-05 NOTE — Telephone Encounter (Signed)
-----   Message from Josue Hector, MD sent at 06/05/2018 12:22 PM EDT ----- F/u cardiac CTA to size aorta and r/o CAD Make sure she take 50 mg lopressor night before and morning of  ----- Message ----- From: Isaiah Serge, NP Sent: 06/05/2018  12:16 PM EDT To: Josue Hector, MD  Dr. Johnsie Cancel I saw pt with hx of Bicuspid AoV with activity Zumba  she has SOB and chest pain other wise feels well.  Now with moderate AS  Recommendations?

## 2018-06-09 NOTE — Progress Notes (Addendum)
[facility] [place], [place], [place] [address] [phone]  Date:  06/11/2018   Name:  Amy Terry   DOB:  1971-07-21   MRN:  [id number]  PCP:  [name], MD    Chief Complaint: New Patient (Initial Visit) (already had flu shot) and Knee Pain (bilateral knee pain and bilateral heel pain-several months)   History of Present Illness:  Amy Terry is a 47 y.o. very pleasant female patient who presents with the following:  Here today as a new patient to my office She is a cardiology pt as follows:  ASSESSMENT AND PLAN: 1.  Bicuspid Ao Valve with increased SOB and chest pain with strenuous activity.  Will check echo to re-eval valve. And discuss with Dr. Johnsie Cancel  Is worried about anemia. Check CBC  Follow up with Dr. Johnsie Cancel in 1 year unless increased pain or issues with echo.   2.  palpitations  stable. Will check BMP.   Echo done last week - they plan to do a follow-up CTA-  Per Dr. Johnsie Cancel, follow-up cardiac CTA to size aorta and rule out CAD  Make sure she take 50 mg lopressor night before and morning of  She is not sure if she needs abx for her dental procedure- she has a cleaning coming up. Took abx in the past. 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   She does 4 12's, at night mostly. She does get tired In her free time she likes to relax She had a positive PPD in the past but her chest film was ok It does not seem like she needed any abx treatment   She was born in the [place] Her 2 kids are 74 and 34 yo.   She has had 1 CS and a tubal ligation  She was noted to have low hg per recent cardiology visit Dr. Cletis Media is her OBG Her menses last about 2 days but her bleeding is heavy with clots.  ?anemia due to menorrhagia  She notes a strong family history of thyroid disease and also has noted recent weight gain She thinks that she has  gained about 25 lbs over the last 18 months or so   She did do some PT for back pain about 2 years ago  She was seeing Dr. Rolena Infante at [facility] ortho for her back and will follow-up with him again as needed  She also has noted bilateral foot pain in the heels for about one year.   Hurt most when she has been seated and then when she gets up  She has tried wearing athletic shoes with padded insoles to work but sx continue [employer] Readings from Last 3 Encounters:  06/11/18 156 lb 12.8 oz (71.1 kg)  06/04/18 159 lb 1.9 oz (72.2 kg)  06/06/17 140 lb (63.5 kg)     Patient Active Problem List   Diagnosis Date Noted  . Iron deficiency anemia 07/26/2014  . Palpitations 07/21/2014  . Allergic rhinitis 07/21/2014    Past Medical History:  Diagnosis Date  . Allergy   . Chicken pox   . Heart murmur   . Pneumonia   . 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 Sinusitis   . UTI (urinary tract infection)     Past Surgical History:  Procedure Laterality Date  . CESAREAN  SECTION    . CESAREAN SECTION    . TUBAL LIGATION    . TUBAL LIGATION  2007    Social History   Tobacco Use  . Smoking status: Never Smoker  . Smokeless tobacco: Never Used  Substance Use Topics  . Alcohol use: No    Alcohol/week: 0.0 standard drinks  . Drug use: No    Family History  Problem Relation Age of Onset  . Hypertension Mother   . Emphysema Mother   . Hypertension Father   . Thyroid disease Sister   . Breast cancer Sister   . Hyperlipidemia Brother   . Thyroid disease Paternal Aunt   . Thyroid disease Sister   . Asthma Sister   . Anemia Sister   . Thyroid disease Sister     No Known Allergies  Medication list has been reviewed and updated.  Current Outpatient Medications on File Prior to Visit  Medication Sig Dispense Refill  . cetirizine (ZYRTEC ALLERGY) 10 MG tablet Take 1 tablet (10 mg total) by mouth daily. 30 tablet 0  . fluticasone (FLONASE) 50 MCG/ACT nasal spray Place 2 sprays into both nostrils  daily. 16 g 12  . ibuprofen (ADVIL,MOTRIN) 600 MG tablet Take 1 tablet (600 mg total) by mouth every 6 (six) hours as needed. 20 tablet 0  . propranolol (INDERAL) 10 MG tablet Take 1 tablet (10 mg total) by mouth daily as needed (palpitations). 30 tablet 2  . [DISCONTINUED] Loratadine (CLARITIN PO) Take 1 tablet by mouth as needed (FOR ALLERGIES).      No current facility-administered medications on file prior to visit.     Review of Systems:  As per HPI- otherwise negative. No fever or chills No CP or SOB   Physical Examination: Vitals:   06/11/18 0910  BP: 118/80  Pulse: 65  Resp: 16  Temp: 98.4 F (36.9 C)  SpO2: 98%   Vitals:   06/11/18 0910  Weight: 156 lb 12.8 oz (71.1 kg)  Height: 5' 1.75" (1.568 m)   Body mass index is 28.91 kg/m. Ideal Body Weight: Weight in (lb) to have BMI = 25: 135.3  GEN: WDWN, NAD, Non-toxic, A & O x 3, mild overweight, looks well  HEENT: Atraumatic, Normocephalic. Neck supple. No masses, No LAD. Ears and Nose: No external deformity. CV: RRR, No M/G/R. No JVD. No thrill. No extra heart sounds. PULM: CTA B, no wheezes, crackles, rhonchi. No retractions. No resp. distress. No accessory muscle use. ABD: S, NT, ND, +BS. No rebound. No HSM. EXTR: No c/c/e NEURO Normal gait.  PSYCH: Normally interactive. Conversant. Not depressed or anxious appearing.  Calm demeanor.  Feet- NV intact.  Warm and well perfused. Mild tenderness at bilateral heel at plantar fascia insertion.    Assessment and Plan: Microcytic anemia - Plan: CBC, Ferritin  Other fatigue - Plan: TSH  Weight gain - Plan: TSH  Aortic stenosis with bicuspid valve - Plan: amoxicillin (AMOXIL) 500 MG capsule  Plantar fasciitis - Plan: Ambulatory referral to Sports Medicine  Establishing care today Will repeat CBC, check ferritin for recent microcytic anemia tsh as well Gave amox to use prior to dental procedures  Referral to sports med- PF sx for a year and she has already  tried conservative measures.  Also encouraged stretching and ice massage in the meantime  Will plan further follow- up pending labs.   Signed [name], MD  Received her labs- called and left detailed message.  Iron def anemia. Will start her on  ferrous sulfate BID- take just one a day if too much constipation Please see me in 6-8 weeks for a recheck Please see your GYN doctor to discuss menorrhagia  Results for orders placed or performed in visit on 06/11/18  CBC  Result Value Ref Range   WBC 4.6 4.0 - 10.5 K/uL   RBC 4.67 3.87 - 5.11 Mil/uL   Platelets 447.0 (H) 150.0 - 400.0 K/uL   Hemoglobin 9.2 (L) 12.0 - 15.0 g/dL   HCT 30.3 (L) 36.0 - 46.0 %   MCV 64.8 Repeated and verified X2. (L) 78.0 - 100.0 fl   MCHC 30.5 30.0 - 36.0 g/dL   RDW 18.4 (H) 11.5 - 15.5 %  Ferritin  Result Value Ref Range   Ferritin 2.4 (L) 10.0 - 291.0 ng/mL  TSH  Result Value Ref Range   TSH 0.78 0.35 - 4.50 uIU/mL

## 2018-06-11 ENCOUNTER — Ambulatory Visit: Payer: 59 | Admitting: Family Medicine

## 2018-06-11 ENCOUNTER — Encounter: Payer: Self-pay | Admitting: Family Medicine

## 2018-06-11 VITALS — BP 118/80 | HR 65 | Temp 98.4°F | Resp 16 | Ht 61.75 in | Wt 156.8 lb

## 2018-06-11 DIAGNOSIS — Q230 Congenital stenosis of aortic valve: Secondary | ICD-10-CM

## 2018-06-11 DIAGNOSIS — E611 Iron deficiency: Secondary | ICD-10-CM | POA: Diagnosis not present

## 2018-06-11 DIAGNOSIS — D509 Iron deficiency anemia, unspecified: Secondary | ICD-10-CM

## 2018-06-11 DIAGNOSIS — Q231 Congenital insufficiency of aortic valve: Secondary | ICD-10-CM

## 2018-06-11 DIAGNOSIS — M722 Plantar fascial fibromatosis: Secondary | ICD-10-CM | POA: Diagnosis not present

## 2018-06-11 DIAGNOSIS — R5383 Other fatigue: Secondary | ICD-10-CM | POA: Diagnosis not present

## 2018-06-11 DIAGNOSIS — I350 Nonrheumatic aortic (valve) stenosis: Secondary | ICD-10-CM

## 2018-06-11 DIAGNOSIS — R635 Abnormal weight gain: Secondary | ICD-10-CM | POA: Diagnosis not present

## 2018-06-11 LAB — CBC
HCT: 30.3 % — ABNORMAL LOW (ref 36.0–46.0)
Hemoglobin: 9.2 g/dL — ABNORMAL LOW (ref 12.0–15.0)
MCHC: 30.5 g/dL (ref 30.0–36.0)
Platelets: 447 10*3/uL — ABNORMAL HIGH (ref 150.0–400.0)
RBC: 4.67 Mil/uL (ref 3.87–5.11)
RDW: 18.4 % — AB (ref 11.5–15.5)
WBC: 4.6 10*3/uL (ref 4.0–10.5)

## 2018-06-11 LAB — TSH: TSH: 0.78 u[IU]/mL (ref 0.35–4.50)

## 2018-06-11 LAB — FERRITIN: FERRITIN: 2.4 ng/mL — AB (ref 10.0–291.0)

## 2018-06-11 MED ORDER — AMOXICILLIN 500 MG PO CAPS: ORAL_CAPSULE | ORAL | 0 refills | Status: DC

## 2018-06-11 MED ORDER — FERROUS SULFATE 325 (65 FE) MG PO TABS: 325.0000 mg | ORAL_TABLET | Freq: Two times a day (BID) | ORAL | 5 refills | Status: DC

## 2018-06-11 MED FILL — FERROUS SULFATE 325 MG TAB: 325 (65 FE) | 50 days supply | Qty: 100 | Fill #0

## 2018-06-11 MED FILL — AMOXICILLIN 500 MG CAPSULE: 500 | 5 days supply | Qty: 20 | Fill #0

## 2018-06-11 NOTE — Patient Instructions (Signed)
Good to meet you today!  We will check your iron and thyroid and be in touch asap I am going to refer you to Dr. Barbaraann Barthel for your feet since you have had symptoms for a year already We also did an rx for amoxicillin prior to dental procedures.  If you are not sure, certainly you can take a dose prior to dental work

## 2018-06-11 NOTE — Addendum Note (Signed)
Addended by: [name] C on: 06/11/2018 04:02 PM   Modules accepted: Orders

## 2018-06-18 ENCOUNTER — Ambulatory Visit (HOSPITAL_COMMUNITY): Payer: 59

## 2018-06-29 ENCOUNTER — Encounter: Payer: Self-pay | Admitting: Family Medicine

## 2018-06-29 ENCOUNTER — Ambulatory Visit: Payer: 59 | Admitting: Family Medicine

## 2018-06-29 VITALS — BP 107/74 | HR 74 | Ht 62.0 in | Wt 153.0 lb

## 2018-06-29 DIAGNOSIS — M722 Plantar fascial fibromatosis: Secondary | ICD-10-CM

## 2018-06-29 NOTE — Progress Notes (Signed)
PCP and consultation requested by: Copland, Gay Filler, MD  Subjective:   HPI: Patient is a 47 y.o. female here for bilateral heel pain.  Patient reports bilateral heel pain for 6 months.  She denies any specific injury.  Symptoms are equal bilaterally.  Pain is worse in the morning with the first few steps of the day - currently 0/10, a soreness with getting up from sitting.  It is also worse when she begins walking after sitting/resting.  It improves throughout the day.  She states she ordered some insoles off of the Internet which did not help.  She does not take any medications.  She has not tried any ice or stretching.  She denies any swelling, erythema, bruising.  No numbness or tingling.  No associated skin changes.  Past Medical History:  Diagnosis Date  . Allergy   . Chicken pox   . Heart murmur   . Pneumonia   . 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 Sinusitis   . UTI (urinary tract infection)     Current Outpatient Medications on File Prior to Visit  Medication Sig Dispense Refill  . amoxicillin (AMOXIL) 500 MG capsule Take 4 pills- 2 gm- 60 min prior to dental procedures 20 capsule 0  . cetirizine (ZYRTEC ALLERGY) 10 MG tablet Take 1 tablet (10 mg total) by mouth daily. 30 tablet 0  . ferrous sulfate 325 (65 FE) MG tablet Take 1 tablet (325 mg total) by mouth 2 (two) times daily with a meal. 60 tablet 5  . fluticasone (FLONASE) 50 MCG/ACT nasal spray Place 2 sprays into both nostrils daily. 16 g 12  . ibuprofen (ADVIL,MOTRIN) 600 MG tablet Take 1 tablet (600 mg total) by mouth every 6 (six) hours as needed. 20 tablet 0  . propranolol (INDERAL) 10 MG tablet Take 1 tablet (10 mg total) by mouth daily as needed (palpitations). 30 tablet 2  . [DISCONTINUED] Loratadine (CLARITIN PO) Take 1 tablet by mouth as needed (FOR ALLERGIES).      No current facility-administered medications on file prior to visit.     Past Surgical History:  Procedure Laterality Date  . CESAREAN SECTION     . CESAREAN SECTION    . TUBAL LIGATION    . TUBAL LIGATION  2007    No Known Allergies  Social History   Socioeconomic History  . Marital status: Married    Spouse name: Not on file  . Number of children: Not on file  . Years of education: Not on file  . Highest education level: Not on file  Occupational History  . Not on file  Social Needs  . Financial resource strain: Not on file  . Food insecurity:    Worry: Not on file    Inability: Not on file  . Transportation needs:    Medical: Not on file    Non-medical: Not on file  Tobacco Use  . Smoking status: Never Smoker  . Smokeless tobacco: Never Used  Substance and Sexual Activity  . Alcohol use: No    Alcohol/week: 0.0 standard drinks  . Drug use: No  . Sexual activity: Yes    Partners: Male  Lifestyle  . Physical activity:    Days per week: Not on file    Minutes per session: Not on file  . Stress: Not on file  Relationships  . Social connections:    Talks on phone: Not on file    Gets together: Not on file  Attends religious service: Not on file    Active member of club or organization: Not on file    Attends meetings of clubs or organizations: Not on file    Relationship status: Not on file  . Intimate partner violence:    Fear of current or ex partner: Not on file    Emotionally abused: Not on file    Physically abused: Not on file    Forced sexual activity: Not on file  Other Topics Concern  . Not on file  Social History Narrative  . Not on file    Family History  Problem Relation Age of Onset  . Hypertension Mother   . Emphysema Mother   . Hypertension Father   . Thyroid disease Sister   . Breast cancer Sister   . Hyperlipidemia Brother   . Thyroid disease Paternal Aunt   . Thyroid disease Sister   . Asthma Sister   . Anemia Sister   . Thyroid disease Sister     BP 107/74   Pulse 74   Ht 5\' 2"  (1.575 m)   Wt 153 lb (69.4 kg)   LMP 06/09/2018   BMI 27.98 kg/m   Review of  Systems: See HPI above.     Objective:  Physical Exam:  Gen: awake, alert, NAD, comfortable in exam room Pulm: breathing unlabored  Left foot: Inspection:  No obvious bony deformity.  No swelling, erythema, or bruising.  Mild pes planus Palpation: Tenderness over the medial calcaneal tubercle ROM: Full  ROM of the ankle. Normal midfoot flexibility Strength: 5/5 strength ankle in all planes Neurovascular: N/V intact distally in the lower extremity Negative calcaneal squeeze  Right foot: Inspection:  No obvious bony deformity.  No swelling, erythema, or bruising.  Mild pes planus Palpation: Tenderness over the medial calcaneal tubercle ROM: Full  ROM of the ankle. Normal midfoot flexibility Strength: 5/5 strength ankle in all planes Neurovascular: N/V intact distally in the lower extremity Negative calcaneal squeeze  Assessment & Plan:  1.  Bilateral plantar fasciitis - Patient instructed on home exercises/stretching - Arch binders - Green insoles provided - NSAIDs and ice as needed - Follow-up in 6 weeks

## 2018-06-29 NOTE — Patient Instructions (Signed)
You have plantar fasciitis Plantar fascia stretch for 20-30 seconds (do 3 of these) in morning Lowering/raise on a step exercises 3 x 10 once or twice a day - this is very important for long term recovery. Can add heel walks, toe walks forward and backward as well Ice heel for 15 minutes as needed. Avoid flat shoes/barefoot walking as much as possible. Arch straps have been shown to help with pain. Inserts are important (dr. Zoe Lan active series, spencos, our green insoles, custom orthotics). Take tylenol and/or aleve as needed for pain  Steroid injection is a consideration for short term pain relief if you are struggling. Physical therapy is also an option. Follow up with me in 6 weeks.

## 2018-07-06 ENCOUNTER — Ambulatory Visit (HOSPITAL_COMMUNITY)
Admission: RE | Admit: 2018-07-06 | Discharge: 2018-07-06 | Disposition: A | Discharge status: Home / Self Care | Payer: 59 | Source: Ambulatory Visit | Attending: Cardiovascular Disease | Admitting: Cardiovascular Disease

## 2018-07-06 ENCOUNTER — Ambulatory Visit (HOSPITAL_COMMUNITY): Admission: RE | Admit: 2018-07-06 | Payer: 59 | Source: Ambulatory Visit

## 2018-07-06 DIAGNOSIS — R079 Chest pain, unspecified: Secondary | ICD-10-CM | POA: Insufficient documentation

## 2018-07-06 DIAGNOSIS — Q230 Congenital stenosis of aortic valve: Secondary | ICD-10-CM | POA: Diagnosis not present

## 2018-07-06 DIAGNOSIS — I350 Nonrheumatic aortic (valve) stenosis: Secondary | ICD-10-CM | POA: Diagnosis not present

## 2018-07-06 DIAGNOSIS — R002 Palpitations: Secondary | ICD-10-CM | POA: Insufficient documentation

## 2018-07-06 DIAGNOSIS — R0602 Shortness of breath: Secondary | ICD-10-CM | POA: Insufficient documentation

## 2018-07-06 DIAGNOSIS — Q231 Congenital insufficiency of aortic valve: Secondary | ICD-10-CM | POA: Insufficient documentation

## 2018-07-06 MED ORDER — IOPAMIDOL (ISOVUE-370) INJECTION 76%
100.0000 mL | Freq: Once | INTRAVENOUS | Status: AC | PRN
  Administered 2018-07-06: 100 mL via INTRAVENOUS

## 2018-07-06 MED ORDER — NITROGLYCERIN 0.4 MG SL SUBL
SUBLINGUAL_TABLET | SUBLINGUAL | Status: AC
  Filled 2018-07-06: qty 2

## 2018-07-06 MED ORDER — NITROGLYCERIN 0.4 MG SL SUBL
0.8000 mg | SUBLINGUAL_TABLET | SUBLINGUAL | Status: DC | PRN
  Administered 2018-07-06: 0.8 mg via SUBLINGUAL

## 2018-07-07 ENCOUNTER — Telehealth: Payer: Self-pay | Admitting: Cardiovascular Disease

## 2018-07-07 NOTE — Telephone Encounter (Signed)
New Message:   Patient returning a call back concerning some results. Patient stated that she work last night. Patient leave a voice mail if there is no answers.

## 2018-07-07 NOTE — Telephone Encounter (Signed)
Patient stated to leave her a message with results if she did not answer. Left detailed message of results, per Dr. Johnsie Cancel, Aorta only 3.6 cm good No CAD good. Asked patient to call back if she had any questions.

## 2018-07-22 DIAGNOSIS — H5201 Hypermetropia, right eye: Secondary | ICD-10-CM | POA: Diagnosis not present

## 2018-07-22 DIAGNOSIS — H5202 Hypermetropia, left eye: Secondary | ICD-10-CM | POA: Diagnosis not present

## 2018-07-22 DIAGNOSIS — H524 Presbyopia: Secondary | ICD-10-CM | POA: Diagnosis not present

## 2018-07-22 DIAGNOSIS — H52222 Regular astigmatism, left eye: Secondary | ICD-10-CM | POA: Diagnosis not present

## 2018-08-10 ENCOUNTER — Ambulatory Visit: Payer: 59 | Admitting: Family Medicine

## 2018-08-11 ENCOUNTER — Ambulatory Visit (HOSPITAL_COMMUNITY)
Admission: EM | Admit: 2018-08-11 | Discharge: 2018-08-11 | Disposition: A | Discharge status: Home / Self Care | Payer: 59 | Attending: Family Medicine | Admitting: Family Medicine

## 2018-08-11 ENCOUNTER — Encounter (HOSPITAL_COMMUNITY): Payer: Self-pay

## 2018-08-11 DIAGNOSIS — M62838 Other muscle spasm: Secondary | ICD-10-CM | POA: Insufficient documentation

## 2018-08-11 DIAGNOSIS — Z791 Long term (current) use of non-steroidal anti-inflammatories (NSAID): Secondary | ICD-10-CM | POA: Insufficient documentation

## 2018-08-11 DIAGNOSIS — Z79899 Other long term (current) drug therapy: Secondary | ICD-10-CM | POA: Diagnosis not present

## 2018-08-11 DIAGNOSIS — N3001 Acute cystitis with hematuria: Secondary | ICD-10-CM | POA: Insufficient documentation

## 2018-08-11 LAB — POCT URINALYSIS DIP (DEVICE)
Bilirubin Urine: NEGATIVE
GLUCOSE, UA: NEGATIVE mg/dL
KETONES UR: NEGATIVE mg/dL
Leukocytes, UA: NEGATIVE
Nitrite: NEGATIVE
PROTEIN: NEGATIVE mg/dL
Specific Gravity, Urine: 1.015 (ref 1.005–1.030)
Urobilinogen, UA: 0.2 mg/dL (ref 0.0–1.0)
pH: 6 (ref 5.0–8.0)

## 2018-08-11 MED ORDER — SULFAMETHOXAZOLE-TRIMETHOPRIM 800-160 MG PO TABS: 1.0000 | ORAL_TABLET | Freq: Two times a day (BID) | ORAL | 0 refills | Status: AC

## 2018-08-11 MED ORDER — PHENAZOPYRIDINE HCL 200 MG PO TABS: 200.0000 mg | ORAL_TABLET | Freq: Three times a day (TID) | ORAL | 0 refills | Status: DC

## 2018-08-11 MED ORDER — NAPROXEN 500 MG PO TABS: 500.0000 mg | ORAL_TABLET | Freq: Two times a day (BID) | ORAL | 0 refills | Status: DC

## 2018-08-11 MED ORDER — CYCLOBENZAPRINE HCL 10 MG PO TABS: 10.0000 mg | ORAL_TABLET | Freq: Two times a day (BID) | ORAL | 0 refills | Status: DC | PRN

## 2018-08-11 MED FILL — NAPROXEN 500 MG TABLET: 500 | 10 days supply | Qty: 20 | Fill #0

## 2018-08-11 MED FILL — PHENAZOPYRIDINE 200 MG TAB: 200 | 2 days supply | Qty: 6 | Fill #0

## 2018-08-11 MED FILL — SULFAMETHOXAZOLE-TMP DS TAB: 800-160 | 5 days supply | Qty: 10 | Fill #0

## 2018-08-11 MED FILL — CYCLOBENZAPRINE HCL 10 MG T: 10 | 10 days supply | Qty: 20 | Fill #0

## 2018-08-11 NOTE — Discharge Instructions (Signed)
Do not take the muscle relaxer if driving as it will make you sleepy.  The medication for bladder spasm will turn your urine orange. Follow up with your doctor or return here as needed.
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
## 2018-08-13 LAB — URINE CULTURE: Culture: >=100000 — AB

## 2018-08-14 ENCOUNTER — Telehealth (HOSPITAL_COMMUNITY): Payer: Self-pay | Admitting: Emergency Medicine

## 2018-08-14 NOTE — Telephone Encounter (Signed)
Urine culture was positive for e coli and was given BACTRIM  at urgent care visit. Attempted to reach patient. No answer at this time.

## 2018-09-15 MED FILL — FERROUS SULFATE 325 MG TAB: 325 (65 FE) | 50 days supply | Qty: 100 | Fill #1

## 2018-11-09 MED FILL — FERROUS SULFATE 325 MG TAB: 325 (65 FE) | 50 days supply | Qty: 100 | Fill #2

## 2018-12-10 ENCOUNTER — Encounter (HOSPITAL_COMMUNITY): Payer: Self-pay

## 2018-12-10 ENCOUNTER — Other Ambulatory Visit: Payer: Self-pay

## 2018-12-10 ENCOUNTER — Ambulatory Visit (HOSPITAL_COMMUNITY)
Admission: EM | Admit: 2018-12-10 | Discharge: 2018-12-10 | Disposition: A | Discharge status: Home / Self Care | Payer: 59 | Attending: Physician Assistant | Admitting: Physician Assistant

## 2018-12-10 DIAGNOSIS — R300 Dysuria: Secondary | ICD-10-CM | POA: Insufficient documentation

## 2018-12-10 DIAGNOSIS — R319 Hematuria, unspecified: Secondary | ICD-10-CM | POA: Diagnosis not present

## 2018-12-10 MED ORDER — TAMSULOSIN HCL 0.4 MG PO CAPS: 0.4000 mg | ORAL_CAPSULE | Freq: Every day | ORAL | 0 refills | Status: DC

## 2018-12-10 MED FILL — TAMSULOSIN HCL 0.4 MG CAP: 0.4 | 10 days supply | Qty: 10 | Fill #0

## 2018-12-10 NOTE — ED Provider Notes (Signed)
[facility]    CSN: [id number] Arrival date & time: 12/10/18  7989     History   Chief Complaint Chief Complaint  Patient presents with  . Urinary Tract Infection    HPI Amy Terry is a 48 y.o. female.   48 year old female comes in for 1 week history of dysuria.  Denies frequency, hematuria.  Does notice some hesitancy.  Denies abdominal pain, nausea, vomiting.  Denies fever, chills, night sweats.  Denies back pain/flank pain.  Denies vaginal discharge, itching, spotting.  LMP 12/02/2018.  History of tubal ligation. Denies history of kidney stones.      Past Medical History:  Diagnosis Date  . Allergy   . Chicken pox   . Heart murmur   . Pneumonia   . 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 Sinusitis   . UTI (urinary tract infection)     Patient Active Problem List   Diagnosis Date Noted  . Iron deficiency anemia 07/26/2014  . Palpitations 07/21/2014  . Allergic rhinitis 07/21/2014    Past Surgical History:  Procedure Laterality Date  . CESAREAN SECTION    . CESAREAN SECTION    . TUBAL LIGATION    . TUBAL LIGATION  2007    OB History   No obstetric history on file.      Home Medications    Prior to Admission medications   Medication Sig Start Date End Date Taking? Authorizing Provider  ferrous sulfate 325 (65 FE) MG tablet Take 1 tablet (325 mg total) by mouth 2 (two) times daily with a meal. 06/11/18  Yes Copland, Gay Filler, MD  phenazopyridine (PYRIDIUM) 200 MG tablet Take 1 tablet (200 mg total) by mouth 3 (three) times daily. 08/11/18  Yes Neese, [place], NP  cyclobenzaprine (FLEXERIL) 10 MG tablet Take 1 tablet (10 mg total) by mouth 2 (two) times daily as needed for muscle spasms. 08/11/18   Ashley Murrain, NP  naproxen (NAPROSYN) 500 MG tablet Take 1 tablet (500 mg total) by mouth 2 (two) times daily. 08/11/18   Ashley Murrain, NP  propranolol (INDERAL) 10 MG tablet Take 1 tablet (10 mg total) by mouth daily as needed (palpitations).  Patient not taking: Reported on 07/06/2018 06/05/18   Josue Hector, MD  tamsulosin (FLOMAX) 0.4 MG CAPS capsule Take 1 capsule (0.4 mg total) by mouth daily. 12/10/18   Ok Edwards, PA-C    Family History Family History  Problem Relation Age of Onset  . Hypertension Mother   . Emphysema Mother   . Hypertension Father   . Thyroid disease Sister   . Breast cancer Sister   . Hyperlipidemia Brother   . Thyroid disease Paternal Aunt   . Thyroid disease Sister   . Asthma Sister   . Anemia Sister   . Thyroid disease Sister     Social History Social History   Tobacco Use  . Smoking status: Never Smoker  . Smokeless tobacco: Never Used  Substance Use Topics  . Alcohol use: No    Alcohol/week: 0.0 standard drinks  . Drug use: No     Allergies   Patient has no known allergies.   Review of Systems Review of Systems  Reason unable to perform ROS: See HPI as above.     Physical Exam Triage Vital Signs ED Triage Vitals  Enc Vitals Group     BP 12/10/18 0834 116/62     Pulse Rate 12/10/18 0834 74  Resp 12/10/18 0834 20     Temp 12/10/18 0834 97.7 F (36.5 C)     Temp src --      SpO2 12/10/18 0834 98 %     Weight --      Height --      Head Circumference --      Peak Flow --      Pain Score 12/10/18 0835 5     Pain Loc --      Pain Edu? --      Excl. in [place]? --    No data found.  Updated Vital Signs BP 116/62   Pulse 74   Temp 97.7 F (36.5 C)   Resp 20   LMP 12/02/2018 (Approximate)   SpO2 98%    Physical Exam Constitutional:      General: She is not in acute distress.    Appearance: She is well-developed. She is not ill-appearing, toxic-appearing or diaphoretic.  HENT:     Head: Normocephalic and atraumatic.  Eyes:     Conjunctiva/sclera: Conjunctivae normal.     Pupils: Pupils are equal, round, and reactive to light.  Cardiovascular:     Rate and Rhythm: Normal rate and regular rhythm.     Heart sounds: Normal heart sounds.  Pulmonary:      Effort: Pulmonary effort is normal.     Breath sounds: Normal breath sounds. No wheezing or rales.  Abdominal:     General: Bowel sounds are normal.     Palpations: Abdomen is soft.     Tenderness: There is no abdominal tenderness. There is no right CVA tenderness, left CVA tenderness, guarding or rebound.  Skin:    General: Skin is warm and dry.  Neurological:     Mental Status: She is alert and oriented to person, place, and time.  Psychiatric:        Behavior: Behavior normal.        Judgment: Judgment normal.      UC Treatments / Results  Labs (all labs ordered are listed, but only abnormal results are displayed) Labs Reviewed  URINE CULTURE    EKG None  Radiology No results found.  Procedures Procedures (including critical care time)  Medications Ordered in UC Medications - No data to display  Initial Impression / Assessment and Plan / UC Course  I have reviewed the triage vital signs and the nursing notes.  Pertinent labs & imaging results that were available during my care of the patient were reviewed by me and considered in my medical decision making (see chart for details).    Urine without nitrite or leukocyte. Does show blood in urine. Discussed possible kidney stone causing symptoms. Will send urine for culture. Patient to take flomax and push fluids. Return precautions given. Otherwise, recheck with PCP for resolution of hematuria in 3-4 weeks.  Final Clinical Impressions(s) / UC Diagnoses   Final diagnoses:  Dysuria  Hematuria, unspecified type    ED Prescriptions    Medication Sig Dispense Auth. Provider   tamsulosin (FLOMAX) 0.4 MG CAPS capsule Take 1 capsule (0.4 mg total) by mouth daily. 10 capsule Tobin Chad, [place] 12/10/18 [phone]

## 2018-12-10 NOTE — ED Triage Notes (Signed)
Pt states has been painful urination for past week

## 2018-12-10 NOTE — Discharge Instructions (Signed)
No infection seen today, but will send for urine culture to check. Start flomax for possible kidney stone causing symptoms. Keep hydrated, urine should be clear to pale yellow in color. Follow up with PCP in 3-4 weeks for recheck urine for blood. Monitor for any worsening of symptoms, fever, worsening abdominal pain, nausea/vomiting, flank pain, follow up for reevaluation.

## 2018-12-11 LAB — URINE CULTURE: Culture: <10000 — AB

## 2019-06-23 NOTE — Patient Instructions (Addendum)
It was great to see you again today, I will be in touch with your labs ASAP Please be sure to get your mammogram this year You can reach Dr Rolena Infante at  [phone] if you would like to follow-up regarding your back  I will let you know how your blood counts/ ferritin look.  If you are anemic you might see if Dr Cletis Media can help you with your heavy periods    Health Maintenance, Female Adopting a healthy lifestyle and getting preventive care are important in promoting health and wellness. Ask your health care provider about:  The right schedule for you to have regular tests and exams.  Things you can do on your own to prevent diseases and keep yourself healthy. What should I know about diet, weight, and exercise? Eat a healthy diet   Eat a diet that includes plenty of vegetables, fruits, low-fat dairy products, and lean protein.  Do not eat a lot of foods that are high in solid fats, added sugars, or sodium. Maintain a healthy weight Body mass index (BMI) is used to identify weight problems. It estimates body fat based on height and weight. Your health care provider can help determine your BMI and help you achieve or maintain a healthy weight. Get regular exercise Get regular exercise. This is one of the most important things you can do for your health. Most adults should:  Exercise for at least 150 minutes each week. The exercise should increase your heart rate and make you sweat (moderate-intensity exercise).  Do strengthening exercises at least twice a week. This is in addition to the moderate-intensity exercise.  Spend less time sitting. Even light physical activity can be beneficial. Watch cholesterol and blood lipids Have your blood tested for lipids and cholesterol at 48 years of age, then have this test every 5 years. Have your cholesterol levels checked more often if:  Your lipid or cholesterol levels are high.  You are older than 48 years of age.  You are at high risk  for heart disease. What should I know about cancer screening? Depending on your health history and family history, you may need to have cancer screening at various ages. This may include screening for:  Breast cancer.  Cervical cancer.  Colorectal cancer.  Skin cancer.  Lung cancer. What should I know about heart disease, diabetes, and high blood pressure? Blood pressure and heart disease  High blood pressure causes heart disease and increases the risk of stroke. This is more likely to develop in people who have high blood pressure readings, are of African descent, or are overweight.  Have your blood pressure checked: ? Every 3-5 years if you are 41-8 years of age. ? Every year if you are 20 years old or older. Diabetes Have regular diabetes screenings. This checks your fasting blood sugar level. Have the screening done:  Once every three years after age 50 if you are at a normal weight and have a low risk for diabetes.  More often and at a younger age if you are overweight or have a high risk for diabetes. What should I know about preventing infection? Hepatitis B If you have a higher risk for hepatitis B, you should be screened for this virus. Talk with your health care provider to find out if you are at risk for hepatitis B infection. Hepatitis C Testing is recommended for:  Everyone born from [birth year] through 1965.  Anyone with known risk factors for hepatitis C. Sexually transmitted infections (  STIs)  Get screened for STIs, including gonorrhea and chlamydia, if: ? You are sexually active and are younger than 48 years of age. ? You are older than 48 years of age and your health care provider tells you that you are at risk for this type of infection. ? Your sexual activity has changed since you were last screened, and you are at increased risk for chlamydia or gonorrhea. Ask your health care provider if you are at risk.  Ask your health care provider about whether you are  at high risk for HIV. Your health care provider may recommend a prescription medicine to help prevent HIV infection. If you choose to take medicine to prevent HIV, you should first get tested for HIV. You should then be tested every 3 months for as long as you are taking the medicine. Pregnancy  If you are about to stop having your period (premenopausal) and you may become pregnant, seek counseling before you get pregnant.  Take 400 to 800 micrograms (mcg) of folic acid every day if you become pregnant.  Ask for birth control (contraception) if you want to prevent pregnancy. Osteoporosis and menopause Osteoporosis is a disease in which the bones lose minerals and strength with aging. This can result in bone fractures. If you are 28 years old or older, or if you are at risk for osteoporosis and fractures, ask your health care provider if you should:  Be screened for bone loss.  Take a calcium or vitamin D supplement to lower your risk of fractures.  Be given hormone replacement therapy (HRT) to treat symptoms of menopause. Follow these instructions at home: Lifestyle  Do not use any products that contain nicotine or tobacco, such as cigarettes, e-cigarettes, and chewing tobacco. If you need help quitting, ask your health care provider.  Do not use street drugs.  Do not share needles.  Ask your health care provider for help if you need support or information about quitting drugs. Alcohol use  Do not drink alcohol if: ? Your health care provider tells you not to drink. ? You are pregnant, may be pregnant, or are planning to become pregnant.  If you drink alcohol: ? Limit how much you use to 0-1 drink a day. ? Limit intake if you are breastfeeding.  Be aware of how much alcohol is in your drink. In the [place] one drink equals one 12 oz bottle of beer (355 mL), one 5 oz glass of wine (148 mL), or one 1 oz glass of hard liquor (44 mL). General instructions  Schedule regular health,  dental, and eye exams.  Stay current with your vaccines.  Tell your health care provider if: ? You often feel depressed. ? You have ever been abused or do not feel safe at home. Summary  Adopting a healthy lifestyle and getting preventive care are important in promoting health and wellness.  Follow your health care provider's instructions about healthy diet, exercising, and getting tested or screened for diseases.  Follow your health care provider's instructions on monitoring your cholesterol and blood pressure. This information is not intended to replace advice given to you by your health care provider. Make sure you discuss any questions you have with your health care provider. Document Released: 02/11/2011 Document Revised: 07/22/2018 Document Reviewed: 07/22/2018 Elsevier Patient Education  2020 [employer].

## 2019-06-23 NOTE — Progress Notes (Addendum)
[facility] [place], [place], [place] [address] [phone]  Date:  06/24/2019   Name:  Amy Terry   DOB:  [birth year]   MRN:  [id number]  PCP:  Amy Mclean, MD    Chief Complaint: Annual Exam   History of Present Illness:  Amy Terry is a 48 y.o. 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 Cletis Terry is her OB/GYN- she will see her soon  Her children are 58 and 67 years old- the are doing well   Labs last year showed iron deficiency anemia, I had her start on iron and asked her to follow-up in 6 to 8 weeks- did not follow-up until now  She has noted heavier menses with clots- for the last few years She will discuss this with her GYN at upcoming visit; explained that treating menorrhagia may fix her anemia issue  She has been to see Amy Terry for back pain in the past- he did a lumbar MRI for her in 2017 She has a hard time bending down, such as to do chest compressions or srcub tubs at work Leaning over for more than a little bit will cause back pain She will follow-up with Amy Terry at her convenience Tetanus UTD   No CP or SOB   Patient Active Problem List   Diagnosis Date Noted  . Iron deficiency anemia 07/26/2014  . Palpitations 07/21/2014  . Allergic rhinitis 07/21/2014    Past Medical History:  Diagnosis Date  . Allergy   . Chicken pox   . Heart murmur   . Pneumonia   . 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 Sinusitis   . UTI (urinary tract infection)     Past Surgical History:  Procedure Laterality Date  . CESAREAN SECTION    . CESAREAN SECTION    . TUBAL LIGATION    . TUBAL LIGATION  2007    Social History   Tobacco Use  . Smoking status: Never Smoker  . Smokeless  tobacco: Never Used  Substance Use Topics  . Alcohol use: No    Alcohol/week: 0.0 standard drinks  . Drug use: No    Family History  Problem Relation Age of Onset  . Hypertension Mother   . Emphysema Mother   . Hypertension Father   . Thyroid disease Sister   . Breast cancer Sister   . Hyperlipidemia Brother   . Thyroid disease Paternal Aunt   . Thyroid disease Sister   . Asthma Sister   . Anemia Sister   . Thyroid disease Sister     No Known Allergies  Medication list has been reviewed and updated.  Current Outpatient Medications on File Prior to Visit  Medication Sig Dispense Refill  . cyclobenzaprine (FLEXERIL) 10 MG tablet Take 1 tablet (10 mg total) by mouth 2 (two) times daily as needed for muscle spasms. 20 tablet 0  . ferrous sulfate 325 (65 FE) MG tablet Take 1 tablet (325 mg total) by mouth 2 (two) times daily with a meal. 60 tablet 5  . naproxen (NAPROSYN) 500 MG tablet Take 1 tablet (500 mg total) by mouth 2 (two) times daily. 20 tablet 0  . phenazopyridine (PYRIDIUM)  200 MG tablet Take 1 tablet (200 mg total) by mouth 3 (three) times daily. 6 tablet 0  . tamsulosin (FLOMAX) 0.4 MG CAPS capsule Take 1 capsule (0.4 mg total) by mouth daily. 10 capsule 0   No current facility-administered medications on file prior to visit.     Review of Systems:  As per HPI- otherwise negative. She is not taking iron right now- she may forget to use   Physical Examination: Vitals:   06/24/19 1438  BP: 112/80  Pulse: 86  Resp: 16  Temp: (!) 97 F (36.1 C)  SpO2: 97%   Vitals:   06/24/19 1438  Weight: 154 lb (69.9 kg)  Height: 5\' 2"  (1.575 m)   Body mass index is 28.17 kg/m. Ideal Body Weight: Weight in (lb) to have BMI = 25: 136.4  GEN: WDWN, NAD, Non-toxic, A & O x 3, normal weight, looks well   TM wnl  HEENT: Atraumatic, Normocephalic. Neck supple. No masses, No LAD. Ears and Nose: No external deformity. CV: RRR, No M/G/R. No JVD. No thrill. No extra heart  sounds. PULM: CTA B, no wheezes, crackles, rhonchi. No retractions. No resp. distress. No accessory muscle use. ABD: S, NT, ND, +BS. No rebound. No HSM. EXTR: No c/c/e NEURO Normal gait.  PSYCH: Normally interactive. Conversant. Not depressed or anxious appearing.  Calm demeanor.    Assessment and Plan: Physical exam  Iron deficiency anemia, unspecified iron deficiency anemia type - Plan: CBC, Hemoglobinopathy Evaluation, Ferritin  Screening for diabetes mellitus - Plan: Comprehensive metabolic panel, Hemoglobin A1c  Screening for hyperlipidemia - Plan: Lipid panel  Screening for thyroid disorder - Plan: TSH  Screening for HIV (human immunodeficiency virus) - Plan: HIV antibody  History of UTI - Plan: Urine culture  CPE today Labs pending as above Work-up for history of anemia- Discuss menorrhagia with GYN as well Will plan further follow- up pending labs.   Signed Amy Blinks, MD  Received her labs 11/18-letter to patient Results for orders placed or performed in visit on 06/24/19  Urine culture   Specimen: Blood  Result Value Ref Range   MICRO NUMBER: [id number]    SPECIMEN QUALITY: Adequate    Sample Source NOT GIVEN    STATUS: FINAL    ISOLATE 1:      Less than 10,000 CFU/mL of single Gram positive organism isolated. No further testing will be performed. If clinically indicated, recollection using a method to minimize contamination, with prompt transfer to Urine Culture Transport Tube, is recommended.  CBC  Result Value Ref Range   WBC 5.9 4.0 - 10.5 K/uL   RBC 4.33 3.87 - 5.11 Mil/uL   Platelets 338.0 150.0 - 400.0 K/uL   Hemoglobin 12.5 12.0 - 15.0 g/dL   HCT 38.5 36.0 - 46.0 %   MCV 89.0 78.0 - 100.0 fl   MCHC 32.4 30.0 - 36.0 g/dL   RDW 12.8 11.5 - 15.5 %  Comprehensive metabolic panel  Result Value Ref Range   Sodium 141 135 - 145 mEq/L   Potassium 4.3 3.5 - 5.1 mEq/L   Chloride 104 96 - 112 mEq/L   CO2 30 19 - 32 mEq/L   Glucose, Bld 94 70 - 99  mg/dL   BUN 8 6 - 23 mg/dL   Creatinine, Ser 0.67 0.40 - 1.20 mg/dL   Total Bilirubin 0.6 0.2 - 1.2 mg/dL   Alkaline Phosphatase 49 39 - 117 U/L   AST 13 0 - 37 U/L   ALT 11  0 - 35 U/L   Total Protein 6.7 6.0 - 8.3 g/dL   Albumin 4.0 3.5 - 5.2 g/dL   GFR 93.68 >60.00 mL/min   Calcium 9.1 8.4 - 10.5 mg/dL  Hemoglobin A1c  Result Value Ref Range   Hgb A1c MFr Bld 5.3 4.6 - 6.5 %  Hemoglobinopathy Evaluation  Result Value Ref Range   RBC 4.31 3.80 - 5.10 Million/uL   Hemoglobin 12.6 11.7 - 15.5 g/dL   HCT 39.4 35.0 - 45.0 %   MCV 91.4 80.0 - 100.0 fL   MCH 29.2 27.0 - 33.0 pg   RDW 11.7 11.0 - 15.0 %   Hgb A 96.7 >96.0 %   Fetal Hemoglobin Testing <1.0 0.0 - 1.9 %   Hemoglobin A2 - HGBRFX 2.3 1.8 - 3.5 %   Interpretation    Lipid panel  Result Value Ref Range   Cholesterol 180 0 - 200 mg/dL   Triglycerides 45.0 0.0 - 149.0 mg/dL   HDL 59.60 >39.00 mg/dL   VLDL 9.0 0.0 - 40.0 mg/dL   LDL Cholesterol 111 (H) 0 - 99 mg/dL   Total CHOL/HDL Ratio 3    NonHDL 120.25   HIV antibody  Result Value Ref Range   HIV 1&2 Ab, 4th Generation NON-REACTIVE NON-REACTI  TSH  Result Value Ref Range   TSH 1.45 0.35 - 4.50 uIU/mL  Ferritin  Result Value Ref Range   Ferritin 5.2 (L) 10.0 - 291.0 ng/mL  Extra Specimen  Result Value Ref Range   Extra tube recieved     Specimen type recieved Serum Separator (SST)

## 2019-06-24 ENCOUNTER — Ambulatory Visit (INDEPENDENT_AMBULATORY_CARE_PROVIDER_SITE_OTHER): Payer: 59 | Admitting: Family Medicine

## 2019-06-24 ENCOUNTER — Other Ambulatory Visit: Payer: Self-pay

## 2019-06-24 ENCOUNTER — Encounter: Payer: Self-pay | Admitting: Family Medicine

## 2019-06-24 VITALS — BP 112/80 | HR 86 | Temp 97.0°F | Resp 16 | Ht 62.0 in | Wt 154.0 lb

## 2019-06-24 DIAGNOSIS — Z114 Encounter for screening for human immunodeficiency virus [HIV]: Secondary | ICD-10-CM | POA: Diagnosis not present

## 2019-06-24 DIAGNOSIS — Z13220 Encounter for screening for lipoid disorders: Secondary | ICD-10-CM | POA: Diagnosis not present

## 2019-06-24 DIAGNOSIS — Z87440 Personal history of urinary (tract) infections: Secondary | ICD-10-CM | POA: Diagnosis not present

## 2019-06-24 DIAGNOSIS — D509 Iron deficiency anemia, unspecified: Secondary | ICD-10-CM | POA: Diagnosis not present

## 2019-06-24 DIAGNOSIS — Z131 Encounter for screening for diabetes mellitus: Secondary | ICD-10-CM | POA: Diagnosis not present

## 2019-06-24 DIAGNOSIS — Z0000 Encounter for general adult medical examination without abnormal findings: Secondary | ICD-10-CM

## 2019-06-24 DIAGNOSIS — Z1329 Encounter for screening for other suspected endocrine disorder: Secondary | ICD-10-CM | POA: Diagnosis not present

## 2019-06-25 LAB — COMPREHENSIVE METABOLIC PANEL
ALT: 11 U/L (ref 0–35)
AST: 13 U/L (ref 0–37)
Albumin: 4 g/dL (ref 3.5–5.2)
Alkaline Phosphatase: 49 U/L (ref 39–117)
BUN: 8 mg/dL (ref 6–23)
CO2: 30 mEq/L (ref 19–32)
Calcium: 9.1 mg/dL (ref 8.4–10.5)
Chloride: 104 mEq/L (ref 96–112)
Creatinine, Ser: 0.67 mg/dL (ref 0.40–1.20)
GFR: 93.68 mL/min (ref >60.00)
Glucose, Bld: 94 mg/dL (ref 70–99)
Potassium: 4.3 mEq/L (ref 3.5–5.1)
Sodium: 141 mEq/L (ref 135–145)
Total Bilirubin: 0.6 mg/dL (ref 0.2–1.2)
Total Protein: 6.7 g/dL (ref 6.0–8.3)

## 2019-06-25 LAB — LIPID PANEL
Cholesterol: 180 mg/dL (ref 0–200)
HDL: 59.6 mg/dL (ref >39.00)
LDL Cholesterol: 111 mg/dL — ABNORMAL HIGH (ref 0–99)
NonHDL: 120.25
Total CHOL/HDL Ratio: 3
Triglycerides: 45 mg/dL (ref 0.0–149.0)
VLDL: 9 mg/dL (ref 0.0–40.0)

## 2019-06-25 LAB — CBC
HCT: 38.5 % (ref 36.0–46.0)
Hemoglobin: 12.5 g/dL (ref 12.0–15.0)
MCHC: 32.4 g/dL (ref 30.0–36.0)
MCV: 89 fl (ref 78.0–100.0)
Platelets: 338 10*3/uL (ref 150.0–400.0)
RBC: 4.33 Mil/uL (ref 3.87–5.11)
RDW: 12.8 % (ref 11.5–15.5)
WBC: 5.9 10*3/uL (ref 4.0–10.5)

## 2019-06-25 LAB — HEMOGLOBIN A1C: Hgb A1c MFr Bld: 5.3 % (ref 4.6–6.5)

## 2019-06-25 LAB — TSH: TSH: 1.45 u[IU]/mL (ref 0.35–4.50)

## 2019-06-25 LAB — FERRITIN: Ferritin: 5.2 ng/mL — ABNORMAL LOW (ref 10.0–291.0)

## 2019-06-25 NOTE — Progress Notes (Signed)
Cardiology Office Note   Date:  07/01/2019   ID:  Amy Terry, DOB 06/12/1971, MRN [id number]   PCP:  [name], MD  Cardiologist:  Dr Johnsie Cancel    No chief complaint on file.     History of Present Illness: Amy Terry is a 48 y.o. female who presents for bicuspid aortic valve.  Hx of palpitations that are stable currently.  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 Allergy   . Chicken pox   . Heart murmur   . Pneumonia   . 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 Sinusitis   . UTI (urinary tract infection)     Past Surgical History:  Procedure Laterality Date  . CESAREAN SECTION    . CESAREAN SECTION    . TUBAL LIGATION    . TUBAL LIGATION  2007     Current Outpatient Medications  Medication Sig Dispense Refill  . cyclobenzaprine (FLEXERIL) 10 MG tablet Take 1 tablet (10 mg total) by mouth 2 (two) times daily as needed for muscle spasms. 20 tablet 0  . naproxen (NAPROSYN) 500 MG tablet Take 1 tablet (500 mg total) by mouth 2 (two) times daily. 20 tablet 0   No current facility-administered medications for this visit.     Allergies:   Patient has no known allergies.    Social History:  The patient  reports that she has never smoked. She has never used smokeless tobacco. She reports that she does not drink alcohol or use drugs.   Family History:  The patient's family history  includes Anemia in her sister; Asthma in her sister; Breast cancer in her sister; Emphysema in her mother; Hyperlipidemia in her brother; Hypertension in her father and mother; Thyroid disease in her paternal aunt, sister, sister, and sister.    ROS:  General:no colds or fevers, no weight changes Skin:no rashes or ulcers HEENT:no blurred vision, no congestion CV:see HPI PUL:see HPI GI:no diarrhea constipation or melena, no indigestion GU:no hematuria, no dysuria MS:no joint pain, no claudication Neuro:no syncope, no lightheadedness Endo:no diabetes, no thyroid disease  Wt Readings from Last 3 Encounters:  07/01/19 153 lb (69.4 kg)  06/24/19 154 lb (69.9 kg)  06/29/18 153 lb (69.4 kg)     PHYSICAL EXAM: VS:  BP 110/76   Pulse 78   Ht 5\' 1"  (1.549 m)   Wt 153 lb (69.4 kg)   LMP 06/21/2019   SpO2 98%   BMI 28.91 kg/m  , BMI Body mass index is 28.91 kg/m.  Affect appropriate Healthy:  appears stated age 41: normal Neck supple with no adenopathy JVP normal no bruits no thyromegaly Lungs clear with no wheezing and good diaphragmatic motion Heart:  S1/S2 AS  murmur, no rub, gallop or click  PMI normal Abdomen: benighn, BS positve, no tenderness, no AAA no bruit.  No HSM or HJR Distal pulses intact with no bruits No edema Neuro non-focal Skin warm and dry No muscular weakness     EKG:   SR no changes from previous.    Recent Labs: 06/24/2019: ALT 11; BUN 8; Creatinine, Ser 0.67; Hemoglobin 12.5; Hemoglobin 12.6; Platelets 338.0; Potassium 4.3; Sodium 141; TSH 1.45    Lipid Panel    Component Value Date/Time   CHOL 180 06/24/2019 1505   TRIG 45.0 06/24/2019 1505   HDL 59.60 06/24/2019 1505   CHOLHDL 3 06/24/2019 1505   VLDL 9.0 06/24/2019 1505   LDLCALC 111 (H) 06/24/2019 1505       Other studies Reviewed:  Echo 06/05/18 Cardiac CTA 07/06/18   ASSESSMENT AND PLAN:  1.  Bicuspid Ao Valve moderate AS normal aortic root f/u TTE ordered  2.   palpitations  Benign no arrhythmia on monitor   3. Anemia : f/u with primary   4. Leg Pain:  Likely some venous engorgement on right with previous varicosities will check ABI"s to r/o PAD although she has plus 4 DP's bilaterally   Current medicines are reviewed with the patient today.  The patient Has no concerns regarding medicines.  The following changes have been made:  See above Labs/ tests ordered today include: Echo for AS and ABI's for leg pain   Disposition:   F/u in a year if echo stable   Signed, [name], MD  07/01/2019 9:05 AM    [facility], [place], [place], [place] Phone: [phone]; Fax: [phone]

## 2019-06-28 LAB — EXTRA SPECIMEN

## 2019-06-28 LAB — HEMOGLOBINOPATHY EVALUATION
Fetal Hemoglobin Testing: <1 % (ref 0.0–1.9)
HCT: 39.4 % (ref 35.0–45.0)
Hemoglobin A2 - HGBRFX: 2.3 % (ref 1.8–3.5)
Hemoglobin: 12.6 g/dL (ref 11.7–15.5)
Hgb A: 96.7 % (ref >96.0)
MCH: 29.2 pg (ref 27.0–33.0)
MCV: 91.4 fL (ref 80.0–100.0)
RBC: 4.31 10*6/uL (ref 3.80–5.10)
RDW: 11.7 % (ref 11.0–15.0)

## 2019-06-28 LAB — HIV ANTIBODY (ROUTINE TESTING W REFLEX): HIV 1&2 Ab, 4th Generation: NONREACTIVE

## 2019-06-28 LAB — URINE CULTURE
MICRO NUMBER:: 1094138
SPECIMEN QUALITY:: ADEQUATE

## 2019-07-01 ENCOUNTER — Encounter: Payer: Self-pay | Admitting: Cardiovascular Disease

## 2019-07-01 ENCOUNTER — Other Ambulatory Visit: Payer: Self-pay

## 2019-07-01 ENCOUNTER — Ambulatory Visit: Payer: 59 | Admitting: Cardiovascular Disease

## 2019-07-01 VITALS — BP 110/76 | HR 78 | Ht 61.0 in | Wt 153.0 lb

## 2019-07-01 DIAGNOSIS — M79604 Pain in right leg: Secondary | ICD-10-CM | POA: Diagnosis not present

## 2019-07-01 DIAGNOSIS — M79605 Pain in left leg: Secondary | ICD-10-CM | POA: Diagnosis not present

## 2019-07-01 DIAGNOSIS — R002 Palpitations: Secondary | ICD-10-CM | POA: Diagnosis not present

## 2019-07-01 DIAGNOSIS — I350 Nonrheumatic aortic (valve) stenosis: Secondary | ICD-10-CM

## 2019-07-01 NOTE — Patient Instructions (Addendum)
Your physician recommends that you continue on your current medications as directed. Please refer to the Current Medication list given to you today.   Your physician has requested that you have an echocardiogram. Echocardiography is a painless test that uses sound waves to create images of your heart. It provides your doctor with information about the size and shape of your heart and how well your heart's chambers and valves are working. This procedure takes approximately one hour. There are no restrictions for this procedure.   Your physician has requested that you have an ankle brachial index (ABI). During this test an ultrasound and blood pressure cuff are used to evaluate the arteries that supply the arms and legs with blood. Allow thirty minutes for this exam. There are no restrictions or special instructions.  Your physician wants you to follow-up in: [facility] will receive a reminder letter in the mail two months in advance. If you don't receive a letter, please call our office to schedule the follow-up appointment.

## 2019-07-05 DIAGNOSIS — Z1231 Encounter for screening mammogram for malignant neoplasm of breast: Secondary | ICD-10-CM | POA: Diagnosis not present

## 2019-07-12 ENCOUNTER — Other Ambulatory Visit: Payer: Self-pay | Admitting: Cardiovascular Disease

## 2019-07-12 ENCOUNTER — Ambulatory Visit (HOSPITAL_COMMUNITY)
Admission: RE | Admit: 2019-07-12 | Discharge: 2019-07-12 | Disposition: A | Discharge status: Home / Self Care | Payer: 59 | Source: Ambulatory Visit | Attending: Internal Medicine | Admitting: Internal Medicine

## 2019-07-12 ENCOUNTER — Other Ambulatory Visit: Payer: Self-pay

## 2019-07-12 DIAGNOSIS — R200 Anesthesia of skin: Secondary | ICD-10-CM

## 2019-07-12 DIAGNOSIS — M79605 Pain in left leg: Secondary | ICD-10-CM | POA: Insufficient documentation

## 2019-07-12 DIAGNOSIS — M79604 Pain in right leg: Secondary | ICD-10-CM | POA: Diagnosis not present

## 2019-07-15 ENCOUNTER — Ambulatory Visit (HOSPITAL_COMMUNITY): Payer: 59 | Attending: Cardiovascular Disease

## 2019-07-15 ENCOUNTER — Other Ambulatory Visit: Payer: Self-pay

## 2019-07-15 DIAGNOSIS — I350 Nonrheumatic aortic (valve) stenosis: Secondary | ICD-10-CM | POA: Diagnosis not present

## 2019-07-26 DIAGNOSIS — H5201 Hypermetropia, right eye: Secondary | ICD-10-CM | POA: Diagnosis not present

## 2019-07-26 DIAGNOSIS — H52222 Regular astigmatism, left eye: Secondary | ICD-10-CM | POA: Diagnosis not present

## 2019-07-26 DIAGNOSIS — H524 Presbyopia: Secondary | ICD-10-CM | POA: Diagnosis not present

## 2019-07-26 DIAGNOSIS — H5202 Hypermetropia, left eye: Secondary | ICD-10-CM | POA: Diagnosis not present

## 2019-08-24 DIAGNOSIS — Z3040 Encounter for surveillance of contraceptives, unspecified: Secondary | ICD-10-CM | POA: Diagnosis not present

## 2019-08-24 DIAGNOSIS — Z1231 Encounter for screening mammogram for malignant neoplasm of breast: Secondary | ICD-10-CM | POA: Diagnosis not present

## 2019-08-24 DIAGNOSIS — Z113 Encounter for screening for infections with a predominantly sexual mode of transmission: Secondary | ICD-10-CM | POA: Diagnosis not present

## 2019-08-24 DIAGNOSIS — Z124 Encounter for screening for malignant neoplasm of cervix: Secondary | ICD-10-CM | POA: Diagnosis not present

## 2019-08-24 DIAGNOSIS — Z6827 Body mass index (BMI) 27.0-27.9, adult: Secondary | ICD-10-CM | POA: Diagnosis not present

## 2019-08-24 DIAGNOSIS — Z01419 Encounter for gynecological examination (general) (routine) without abnormal findings: Secondary | ICD-10-CM | POA: Diagnosis not present

## 2019-09-15 DIAGNOSIS — H40013 Open angle with borderline findings, low risk, bilateral: Secondary | ICD-10-CM | POA: Diagnosis not present

## 2019-09-15 DIAGNOSIS — H43391 Other vitreous opacities, right eye: Secondary | ICD-10-CM | POA: Diagnosis not present

## 2019-09-15 DIAGNOSIS — H2513 Age-related nuclear cataract, bilateral: Secondary | ICD-10-CM | POA: Diagnosis not present

## 2019-09-15 DIAGNOSIS — D3132 Benign neoplasm of left choroid: Secondary | ICD-10-CM | POA: Diagnosis not present

## 2020-08-02 DIAGNOSIS — H40013 Open angle with borderline findings, low risk, bilateral: Secondary | ICD-10-CM | POA: Diagnosis not present

## 2020-08-02 DIAGNOSIS — H43391 Other vitreous opacities, right eye: Secondary | ICD-10-CM | POA: Diagnosis not present

## 2020-08-02 DIAGNOSIS — H2513 Age-related nuclear cataract, bilateral: Secondary | ICD-10-CM | POA: Diagnosis not present

## 2020-08-02 DIAGNOSIS — D3132 Benign neoplasm of left choroid: Secondary | ICD-10-CM | POA: Diagnosis not present

## 2020-09-15 ENCOUNTER — Ambulatory Visit: Payer: 59 | Attending: Internal Medicine

## 2020-09-15 DIAGNOSIS — Z23 Encounter for immunization: Secondary | ICD-10-CM

## 2020-09-15 NOTE — Progress Notes (Signed)
   Covid-19 Vaccination Clinic  Name:  Amy Terry    MRN: [id number] DOB: [birth year]  09/15/2020  Amy Terry was observed post Covid-19 immunization for 15 minutes without incident. She was provided with Vaccine Information Sheet and instruction to access the V-Safe system.   Amy Terry was instructed to call 911 with any severe reactions post vaccine: Marland Kitchen Difficulty breathing  . Swelling of face and throat  . A fast heartbeat  . A bad rash all over body  . Dizziness and weakness   Immunizations Administered    Name Date Dose VIS Date Route   Moderna Covid-19 Booster Vaccine 09/15/2020  2:42 PM 0.25 mL 05/31/2020 Intramuscular   Manufacturer: Moderna   Lot: [id number]   [place]: [id number]

## 2020-10-12 ENCOUNTER — Other Ambulatory Visit (HOSPITAL_BASED_OUTPATIENT_CLINIC_OR_DEPARTMENT_OTHER): Payer: Self-pay

## 2021-03-06 ENCOUNTER — Other Ambulatory Visit: Payer: Self-pay

## 2021-03-06 ENCOUNTER — Encounter: Payer: Self-pay | Admitting: Emergency Medicine

## 2021-03-06 ENCOUNTER — Ambulatory Visit
Admission: EM | Admit: 2021-03-06 | Discharge: 2021-03-06 | Disposition: A | Discharge status: Home / Self Care | Payer: 59 | Attending: Emergency Medicine | Admitting: Emergency Medicine

## 2021-03-06 ENCOUNTER — Other Ambulatory Visit (HOSPITAL_BASED_OUTPATIENT_CLINIC_OR_DEPARTMENT_OTHER): Payer: Self-pay

## 2021-03-06 DIAGNOSIS — R300 Dysuria: Secondary | ICD-10-CM | POA: Insufficient documentation

## 2021-03-06 LAB — POCT URINALYSIS DIP (MANUAL ENTRY)
Bilirubin, UA: NEGATIVE
Blood, UA: NEGATIVE
Glucose, UA: NEGATIVE mg/dL
Ketones, POC UA: NEGATIVE mg/dL
Nitrite, UA: NEGATIVE
Protein Ur, POC: NEGATIVE mg/dL
Spec Grav, UA: 1.01 (ref 1.010–1.025)
Urobilinogen, UA: 0.2 E.U./dL
pH, UA: 5.5 (ref 5.0–8.0)

## 2021-03-06 MED ORDER — PHENAZOPYRIDINE HCL 200 MG PO TABS
200.0000 mg | ORAL_TABLET | Freq: Three times a day (TID) | ORAL | 0 refills | Status: DC
  Filled 2021-03-06: qty 6, 2d supply, fill #0

## 2021-03-06 NOTE — ED Triage Notes (Signed)
Pt has burning on urination only for 5 days now and has noticed a foul odor to her urine

## 2021-03-06 NOTE — ED Provider Notes (Signed)
[facility]    CSN: [id number] Arrival date & time: 03/06/21  1555      History   Chief Complaint Chief Complaint  Patient presents with   Dysuria     HPI Amy Terry is a 50 y.o. female presenting today for evaluation possible UTI.  Reports that she has had dysuria for approximately 5 days.  Noticed foul odor to urine.  Denies fever, nausea vomiting or abdominal pain.  Does note some slight lower back pain.  She denies urinary frequency or urgency.  Does have history of prior UTIs.  Denies any vaginal discharge itching or irritation.  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      Home Medications    Prior to Admission medications   Medication Sig Start Date End Date Taking? Authorizing Provider  phenazopyridine (PYRIDIUM) 200 MG tablet Take 1 tablet (200 mg total) by mouth 3 (three) times daily. 03/06/21  Yes Prynce Jacober C, PA-C  cyclobenzaprine (FLEXERIL) 10 MG tablet Take 1 tablet (10 mg total) by mouth 2 (two) times daily as needed for muscle spasms. 08/11/18   Ashley Murrain, NP  naproxen (NAPROSYN) 500 MG tablet Take 1 tablet (500 mg total) by mouth 2 (two) times daily. 08/11/18   Ashley Murrain, NP    Family History Family History  Problem Relation Age of Onset   Hypertension Mother    Emphysema Mother    Hypertension Father    Thyroid disease Sister    Breast cancer Sister    Hyperlipidemia Brother    Thyroid disease Paternal Aunt    Thyroid disease Sister     Asthma Sister    Anemia Sister    Thyroid disease Sister     Social History Social History   Tobacco Use   Smoking status: Never   Smokeless tobacco: Never  Substance Use Topics   Alcohol use: No    Alcohol/week: 0.0 standard drinks   Drug use: No     Allergies   Patient has no known allergies.   Review of Systems Review of Systems  Constitutional:  Negative for fever.  Respiratory:  Negative for shortness of breath.   Cardiovascular:  Negative for chest pain.  Gastrointestinal:  Negative for abdominal pain, diarrhea, nausea and vomiting.  Genitourinary:  Positive for dysuria. Negative for flank pain, genital sores, hematuria, menstrual problem, vaginal bleeding, vaginal discharge and vaginal pain.  Musculoskeletal:  Negative for back pain.  Skin:  Negative for rash.  Neurological:  Negative for dizziness, light-headedness and headaches.    Physical Exam Triage Vital Signs ED Triage Vitals  Enc Vitals Group     BP      Pulse      Resp      Temp      Temp src      SpO2  Weight      Height      Head Circumference      Peak Flow      Pain Score      Pain Loc      Pain Edu?      Excl. in [place]?    No data found.  Updated Vital Signs BP 111/76   Pulse 89   Temp 98.4 F (36.9 C) (Oral)   Resp 20   SpO2 98%   Visual Acuity Right Eye Distance:   Left Eye Distance:   Bilateral Distance:    Right Eye Near:   Left Eye Near:    Bilateral Near:     Physical Exam Vitals and nursing note reviewed.  Constitutional:      Appearance: She is well-developed.     Comments: No acute distress  HENT:     Head: Normocephalic and atraumatic.     Nose: Nose normal.  Eyes:     Conjunctiva/sclera: Conjunctivae normal.  Cardiovascular:     Rate and Rhythm: Normal rate.  Pulmonary:     Effort: Pulmonary effort is normal. No respiratory distress.  Abdominal:     General: There is no distension.  Musculoskeletal:        General: Normal range of motion.      Cervical back: Neck supple.  Skin:    General: Skin is warm and dry.  Neurological:     Mental Status: She is alert and oriented to person, place, and time.     UC Treatments / Results  Labs (all labs ordered are listed, but only abnormal results are displayed) Labs Reviewed  POCT URINALYSIS DIP (MANUAL ENTRY) - Abnormal; Notable for the following components:      Result Value   Leukocytes, UA Trace (*)    All other components within normal limits  URINE CULTURE  CERVICOVAGINAL ANCILLARY ONLY    EKG   Radiology No results found.  Procedures Procedures (including critical care time)  Medications Ordered in UC Medications - No data to display  Initial Impression / Assessment and Plan / UC Course  I have reviewed the triage vital signs and the nursing notes.  Pertinent labs & imaging results that were available during my care of the patient were reviewed by me and considered in my medical decision making (see chart for details).     Dysuria-UA only with trace leuks, not highly suggestive of UTI, will send for culture to further evaluate for UTI, recommended also screening for any vaginal infections contributing to symptoms.  Deferring antibiotic therapy at this time and placing on Pyridium to use for symptomatic care, push fluids.  Continue to monitor,Discussed strict return precautions. Patient verbalized understanding and is agreeable with plan.  Final Clinical Impressions(s) / UC Diagnoses   Final diagnoses:  Dysuria     Discharge Instructions      Urine culture and vaginal swab pending to further evaluate for any infections contributing to your symptoms Drink plenty of water and fluids Use Pyridium as needed for burning/pain Follow-up if not improving or worsening     ED Prescriptions     Medication Sig Dispense Auth. Provider   phenazopyridine (PYRIDIUM) 200 MG tablet Take 1 tablet (200 mg total) by mouth 3 (three) times daily. 6 tablet Jaelene Garciagarcia, [place]  C, PA-C      PDMP not reviewed this encounter.   Janith Lima, [place] 03/07/21 [phone]

## 2021-03-06 NOTE — Discharge Instructions (Addendum)
Urine culture and vaginal swab pending to further evaluate for any infections contributing to your symptoms Drink plenty of water and fluids Use Pyridium as needed for burning/pain Follow-up if not improving or worsening

## 2021-03-07 ENCOUNTER — Other Ambulatory Visit (HOSPITAL_BASED_OUTPATIENT_CLINIC_OR_DEPARTMENT_OTHER): Payer: Self-pay

## 2021-03-09 ENCOUNTER — Telehealth: Payer: Self-pay | Admitting: Emergency Medicine

## 2021-03-09 ENCOUNTER — Other Ambulatory Visit (HOSPITAL_BASED_OUTPATIENT_CLINIC_OR_DEPARTMENT_OTHER): Payer: Self-pay

## 2021-03-09 LAB — URINE CULTURE: Culture: 20000 — AB

## 2021-03-09 LAB — CERVICOVAGINAL ANCILLARY ONLY
Bacterial Vaginitis (gardnerella): NEGATIVE
Candida Glabrata: NEGATIVE
Candida Vaginitis: NEGATIVE
Chlamydia: NEGATIVE
Comment: NEGATIVE
Comment: NEGATIVE
Comment: NEGATIVE
Comment: NEGATIVE
Comment: NEGATIVE
Comment: NORMAL
Neisseria Gonorrhea: NEGATIVE
Trichomonas: NEGATIVE

## 2021-03-09 MED ORDER — NITROFURANTOIN MONOHYD MACRO 100 MG PO CAPS
100.0000 mg | ORAL_CAPSULE | Freq: Two times a day (BID) | ORAL | 0 refills | Status: AC
  Filled 2021-03-09: qty 10, 5d supply, fill #0

## 2021-03-09 NOTE — Telephone Encounter (Signed)
Called to discuss urine culture results, no answer, voicemail left.  Only 20,000 colonies of E. coli, but if patient still having symptoms we will recommend treatment with Macrobid twice daily x5 days.

## 2021-03-14 ENCOUNTER — Other Ambulatory Visit (HOSPITAL_COMMUNITY): Payer: Self-pay

## 2021-04-20 ENCOUNTER — Other Ambulatory Visit (HOSPITAL_BASED_OUTPATIENT_CLINIC_OR_DEPARTMENT_OTHER): Payer: Self-pay

## 2021-04-21 ENCOUNTER — Other Ambulatory Visit (HOSPITAL_BASED_OUTPATIENT_CLINIC_OR_DEPARTMENT_OTHER): Payer: Self-pay

## 2021-06-12 DIAGNOSIS — U071 COVID-19: Secondary | ICD-10-CM

## 2021-06-12 HISTORY — DX: COVID-19: U07.1

## 2021-06-24 DIAGNOSIS — Z20822 Contact with and (suspected) exposure to covid-19: Secondary | ICD-10-CM | POA: Diagnosis not present

## 2021-08-08 DIAGNOSIS — H40013 Open angle with borderline findings, low risk, bilateral: Secondary | ICD-10-CM | POA: Diagnosis not present

## 2021-08-08 DIAGNOSIS — H2513 Age-related nuclear cataract, bilateral: Secondary | ICD-10-CM | POA: Diagnosis not present

## 2021-12-04 ENCOUNTER — Other Ambulatory Visit (HOSPITAL_BASED_OUTPATIENT_CLINIC_OR_DEPARTMENT_OTHER): Payer: Self-pay

## 2021-12-04 ENCOUNTER — Ambulatory Visit: Payer: 59 | Admitting: Family

## 2021-12-04 ENCOUNTER — Other Ambulatory Visit (HOSPITAL_COMMUNITY)
Admission: RE | Admit: 2021-12-04 | Discharge: 2021-12-04 | Disposition: A | Discharge status: Home / Self Care | Payer: 59 | Source: Ambulatory Visit | Attending: Family | Admitting: Family

## 2021-12-04 VITALS — BP 112/72 | HR 68 | Temp 98.9°F | Resp 18 | Ht 62.0 in | Wt 149.4 lb

## 2021-12-04 DIAGNOSIS — R300 Dysuria: Secondary | ICD-10-CM | POA: Insufficient documentation

## 2021-12-04 LAB — POCT URINALYSIS DIP (CLINITEK)
Bilirubin, UA: NEGATIVE
Blood, UA: NEGATIVE
Glucose, UA: NEGATIVE mg/dL
Ketones, POC UA: NEGATIVE mg/dL
Leukocytes, UA: NEGATIVE
Nitrite, UA: NEGATIVE
POC PROTEIN,UA: NEGATIVE
Spec Grav, UA: 1.01 (ref 1.010–1.025)
Urobilinogen, UA: 0.2 E.U./dL
pH, UA: 6.5 (ref 5.0–8.0)

## 2021-12-04 MED ORDER — NITROFURANTOIN MONOHYD MACRO 100 MG PO CAPS
100.0000 mg | ORAL_CAPSULE | Freq: Two times a day (BID) | ORAL | 0 refills | Status: DC
  Filled 2021-12-04: qty 14, 7d supply, fill #0

## 2021-12-04 NOTE — Progress Notes (Signed)
?  Amy Terry is a 51 y.o. female with the following history as recorded in Epic[facility]:  ?Patient Active Problem List  ? Diagnosis Date Noted  ? Iron deficiency anemia 07/26/2014  ? Palpitations 07/21/2014  ? Allergic rhinitis 07/21/2014  ?  ?Current Outpatient Medications  ?Medication Sig Dispense Refill  ? nitrofurantoin, macrocrystal-monohydrate, (MACROBID) 100 MG capsule Take 1 capsule (100 mg total) by mouth 2 (two) times daily. 14 capsule 0  ? ?No current facility-administered medications for this visit.  ?  ?Allergies: Patient has no known allergies.  ?Past Medical History:  ?Diagnosis Date  ? Allergy   ? Chicken pox   ? Heart murmur   ? Pneumonia   ? Positive TB test   ? skin test  ? Sinusitis   ? UTI (urinary tract infection)   ?  ?Past Surgical History:  ?Procedure Laterality Date  ? CESAREAN SECTION    ? CESAREAN SECTION    ? TUBAL LIGATION    ? TUBAL LIGATION  2007  ?  ?Family History  ?Problem Relation Age of Onset  ? Hypertension Mother   ? Emphysema Mother   ? Hypertension Father   ? Thyroid disease Sister   ? Breast cancer Sister   ? Hyperlipidemia Brother   ? Thyroid disease Paternal Aunt   ? Thyroid disease Sister   ? Asthma Sister   ? Anemia Sister   ? Thyroid disease Sister   ?  ?Social History  ? ?Tobacco Use  ? Smoking status: Never  ? Smokeless tobacco: Never  ?Substance Use Topics  ? Alcohol use: No  ?  Alcohol/week: 0.0 standard drinks  ?  ?Subjective:  ? ?Possible UTI; complaining of symptoms "on and off" for the past month;  ?+ strong smelling urine; no blood in urine; no fever; no vaginal discharge;  ? ?LMP- irregular ? ? ?Objective:  ?Vitals:  ? 12/04/21 1126  ?BP: 112/72  ?Pulse: 68  ?Resp: 18  ?Temp: 98.9 ?F (37.2 ?C)  ?TempSrc: Oral  ?SpO2: 99%  ?Weight: 149 lb 6.4 oz (67.8 kg)  ?Height: '5\' 2"'$  (1.575 m)  ?  ?General: Well developed, well nourished, in no acute distress  ?Skin : Warm and dry.  ?Head: Normocephalic and atraumatic  ?Lungs: Respirations unlabored; clear to  auscultation bilaterally without wheeze, rales, rhonchi  ?CVS exam: normal rate and regular rhythm.  ?Neurologic: Alert and oriented; speech intact; face symmetrical; moves all extremities well; CNII-XII intact without focal deficit  ? ?Assessment:  ?1. Dysuria   ?  ?Plan:  ?Check U/A and urine culture as well as vaginal swab; will go ahead and start treatment for suspected UTI; Rx for Macrobid 100 mg bid x 7 days; keep planned follow up with her PCP for upcoming CPE in May 2023; ? ?This visit occurred during the SARS-CoV-2 public health emergency.  Safety protocols were in place, including screening questions prior to the visit, additional usage of staff PPE, and extensive cleaning of exam room while observing appropriate contact time as indicated for disinfecting solutions.  ? ? ?No follow-ups on file.  ?Orders Placed This Encounter  ?Procedures  ? Urine Culture  ? POCT URINALYSIS DIP (CLINITEK)  ?  ?Requested Prescriptions  ? ?Signed Prescriptions Disp Refills  ? nitrofurantoin, macrocrystal-monohydrate, (MACROBID) 100 MG capsule 14 capsule 0  ?  Sig: Take 1 capsule (100 mg total) by mouth 2 (two) times daily.  ?  ? ?

## 2021-12-05 LAB — CERVICOVAGINAL ANCILLARY ONLY
Bacterial Vaginitis (gardnerella): NEGATIVE
Candida Glabrata: NEGATIVE
Candida Vaginitis: NEGATIVE
Chlamydia: NEGATIVE
Comment: NEGATIVE
Comment: NEGATIVE
Comment: NEGATIVE
Comment: NEGATIVE
Comment: NEGATIVE
Comment: NORMAL
Neisseria Gonorrhea: NEGATIVE
Trichomonas: NEGATIVE

## 2021-12-05 LAB — URINE CULTURE
MICRO NUMBER:: 13308886
SPECIMEN QUALITY:: ADEQUATE

## 2021-12-06 ENCOUNTER — Other Ambulatory Visit (HOSPITAL_BASED_OUTPATIENT_CLINIC_OR_DEPARTMENT_OTHER): Payer: Self-pay

## 2021-12-18 NOTE — Patient Instructions (Addendum)
It was great to see you again today, I will be in touch with your labs as soon as possible ?Pap today ?Ordered your mammogram and cologuard for colon cancer screening  ? ?Tetanus shot today ?Please consider getting a covid booster if not done in the last 6 months or so  ?First dose of shingrix today- you can get the 2nd dose in 2-6 months  ?

## 2021-12-18 NOTE — Progress Notes (Addendum)
Therapist, music at [facility] ?[address], [address] ?[place], [place] [address] ?336 [phone] ?Fax [phone] ? ?Date:  12/24/2021  ? ?Name:  Amy Terry   DOB:  11-14-1970   MRN:  [id number] ? ?PCP:  [name], MD  ? ? ?Chief Complaint: Annual Exam (Concerns/ questions: pt was seen 2 weeks ago, she says she still has the discharge, but not as bad. /Tdap: more than 10 years- pt reported/Shingrix: none/Colonoscopy: none/Pap/ Mammogram: sees Dr Rivard/Hep C Screen due today) ? ? ?History of Present Illness: ? ?Amy Terry is a 51 y.o. very pleasant female patient who presents with the following: ? ?Patient seen today for physical exam ?Most recent visit with myself November 2020 ?She saw Mickel Baas about 2 weeks ago: ?Assessment:  ?1. Dysuria   ?  ?Plan:  ?Check U/A and urine culture as well as vaginal swab; will go ahead and start treatment for suspected UTI; Rx for Macrobid 100 mg bid x 7 days; keep planned follow up with her PCP for upcoming CPE in May 2023; ? 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 ?She has 2 children- they are 62 and 28 yo ?History of iron deficiency anemia and menorrhagia, heart murmur ?She sees cardiology for monitoring of bicuspid aortic valve ? ?Needs hepatitis C screening ?Tetanus- she thinks 15 years ago, will update today  ?Colon cancer screening- not done yet.  No family history or symptoms-after discussion of options she would like to use Cologuard ?Pap smear- will do today  ?Mammogram- need to order  ?Shingrix-start today ?Labs can be updated ?Patient Active Problem List  ? Diagnosis Date Noted  ? Iron deficiency anemia 07/26/2014  ? Palpitations 07/21/2014  ? Allergic rhinitis 07/21/2014  ? ? ?Past Medical History:  ?Diagnosis Date  ? Allergy   ? Chicken pox   ? Heart murmur   ? Pneumonia   ? Positive TB test   ? skin test  ? Sinusitis   ? UTI (urinary tract infection)   ? ? ?Past Surgical History:   ?Procedure Laterality Date  ? CESAREAN SECTION    ? CESAREAN SECTION    ? TUBAL LIGATION    ? TUBAL LIGATION  2007  ? ? ?Social History  ? ?Tobacco Use  ? Smoking status: Never  ? Smokeless tobacco: Never  ?Substance Use Topics  ? Alcohol use: No  ?  Alcohol/week: 0.0 standard drinks  ? Drug use: No  ? ? ?Family History  ?Problem Relation Age of Onset  ? Hypertension Mother   ? Emphysema Mother   ? Hypertension Father   ? Thyroid disease Sister   ? Breast cancer Sister   ? Hyperlipidemia Brother   ? Thyroid disease Paternal Aunt   ? Thyroid disease Sister   ? Asthma Sister   ? Anemia Sister   ? Thyroid disease Sister   ? ? ?No Known Allergies ? ?Medication list has been reviewed and updated. ? ?Current Outpatient Medications on File Prior to Visit  ?Medication Sig Dispense Refill  ? Chorionic Gonadotropin (HCG IJ) Inject as directed.    ? Phendimetrazine Tartrate 35 MG TABS Take by mouth.    ? ?No current facility-administered medications on file prior to visit.  ? ? ?Review of Systems: ? ?As per HPI- otherwise negative. ? ? ?Physical Examination: ?Vitals:  ? 12/24/21 1027  ?BP: 102/60  ?Pulse: 75  ?Resp: 18  ?Temp:  98.3 ?F (36.8 ?C)  ?SpO2: 98%  ? ?Vitals:  ? 12/24/21 1027  ?Weight: 142 lb 9.6 oz (64.7 kg)  ?Height: '5\' 2"'$  (1.575 m)  ? ?Body mass index is 26.08 kg/m?. ?Ideal Body Weight: Weight in (lb) to have BMI = 25: 136.4 ? ?GEN: no acute distress.  Minimal overweight looks well  ?HEENT: Atraumatic, Normocephalic. Bilateral TM wnl, oropharynx normal.  PEERL,EOMI.   ?Ears and Nose: No external deformity. ?CV: 2/6 systolic murmur, stable, No M/G/R. No JVD. No thrill. No extra heart sounds. ?PULM: CTA B, no wheezes, crackles, rhonchi. No retractions. No resp. distress. No accessory muscle use. ?ABD: S, NT, ND, +BS. No rebound. No HSM. ?EXTR: No c/c/e ?PSYCH: Normally interactive. Conversant.  ?Pap collected today.  Normal pelvic exam ? ?Assessment and Plan: ?Physical exam ? ?Iron deficiency anemia, unspecified  iron deficiency anemia type - Plan: CBC, Ferritin ? ?Screening for diabetes mellitus - Plan: Comprehensive metabolic panel, Hemoglobin A1c ? ?Screening for hyperlipidemia - Plan: Lipid panel ? ?Screening for thyroid disorder - Plan: TSH ? ?Encounter for hepatitis C screening test for low risk patient - Plan: Hepatitis C antibody ? ?Screening for colon cancer - Plan: Cologuard ? ?Screening mammogram for breast cancer - Plan: MM 3D SCREEN BREAST BILATERAL ? ?Immunization due - Plan: Td vaccine greater than or equal to 7yo preservative free IM, Varicella-zoster vaccine IM (Shingrix) ? ?Screening for cervical cancer - Plan: Cytology - PAP ? ?Nasal congestion - Plan: Ambulatory referral to ENT ? ?Seen today for physical exam.  Encouraged healthy diet and exercise routine ?Labs are pending as above, Pap collected ?Ordered mammogram, order Cologuard ?She notes chronic nasal congestion, history of deviated septum.  She would like to see ENT.  Placed referral ?Gave tetanus and first dose of Shingrix today ? ?Signed ?[name], MD ? ?Addendum 5/16, received labs as below.  Message to patient ?At last check in 2020 her hemoglobin was 12.6 ?Results for orders placed or performed in visit on 12/24/21  ?CBC  ?Result Value Ref Range  ? WBC 4.8 4.0 - 10.5 K/uL  ? RBC 4.55 3.87 - 5.11 Mil/uL  ? Platelets 451.0 (H) 150.0 - 400.0 K/uL  ? Hemoglobin 8.6 Repeated and verified X2. (L) 12.0 - 15.0 g/dL  ? HCT 29.1 (L) 36.0 - 46.0 %  ? MCV 63.8 (L) 78.0 - 100.0 fl  ? MCHC 29.7 (L) 30.0 - 36.0 g/dL  ? RDW 19.0 (H) 11.5 - 15.5 %  ?Comprehensive metabolic panel  ?Result Value Ref Range  ? Sodium 138 135 - 145 mEq/L  ? Potassium 4.0 3.5 - 5.1 mEq/L  ? Chloride 105 96 - 112 mEq/L  ? CO2 24 19 - 32 mEq/L  ? Glucose, Bld 102 (H) 70 - 99 mg/dL  ? BUN 13 6 - 23 mg/dL  ? Creatinine, Ser 0.58 0.40 - 1.20 mg/dL  ? Total Bilirubin 0.3 0.2 - 1.2 mg/dL  ? Alkaline Phosphatase 46 39 - 117 U/L  ? AST 14 0 - 37 U/L  ? ALT 11 0 - 35 U/L  ? Total  Protein 6.9 6.0 - 8.3 g/dL  ? Albumin 4.0 3.5 - 5.2 g/dL  ? GFR 104.96 >60.00 mL/min  ? Calcium 8.9 8.4 - 10.5 mg/dL  ?Hemoglobin A1c  ?Result Value Ref Range  ? Hgb A1c MFr Bld 5.4 4.6 - 6.5 %  ?Lipid panel  ?Result Value Ref Range  ? Cholesterol 170 0 - 200 mg/dL  ? Triglycerides 52.0 0.0 - 149.0 mg/dL  ?  HDL 75.10 >39.00 mg/dL  ? VLDL 10.4 0.0 - 40.0 mg/dL  ? LDL Cholesterol 85 0 - 99 mg/dL  ? Total CHOL/HDL Ratio 2   ? NonHDL 95.34   ?TSH  ?Result Value Ref Range  ? TSH 0.97 0.35 - 5.50 uIU/mL  ?Ferritin  ?Result Value Ref Range  ? Ferritin 2.0 (L) 10.0 - 291.0 ng/mL  ? ? ?

## 2021-12-24 ENCOUNTER — Ambulatory Visit (INDEPENDENT_AMBULATORY_CARE_PROVIDER_SITE_OTHER): Payer: 59 | Admitting: Family Medicine

## 2021-12-24 ENCOUNTER — Other Ambulatory Visit (HOSPITAL_COMMUNITY)
Admission: RE | Admit: 2021-12-24 | Discharge: 2021-12-24 | Disposition: A | Discharge status: Home / Self Care | Payer: 59 | Source: Ambulatory Visit | Attending: Family Medicine | Admitting: Family Medicine

## 2021-12-24 VITALS — BP 102/60 | HR 75 | Temp 98.3°F | Resp 18 | Ht 62.0 in | Wt 142.6 lb

## 2021-12-24 DIAGNOSIS — Z0000 Encounter for general adult medical examination without abnormal findings: Secondary | ICD-10-CM

## 2021-12-24 DIAGNOSIS — D509 Iron deficiency anemia, unspecified: Secondary | ICD-10-CM | POA: Diagnosis not present

## 2021-12-24 DIAGNOSIS — Z1231 Encounter for screening mammogram for malignant neoplasm of breast: Secondary | ICD-10-CM | POA: Diagnosis not present

## 2021-12-24 DIAGNOSIS — Z1329 Encounter for screening for other suspected endocrine disorder: Secondary | ICD-10-CM

## 2021-12-24 DIAGNOSIS — Z1211 Encounter for screening for malignant neoplasm of colon: Secondary | ICD-10-CM | POA: Diagnosis not present

## 2021-12-24 DIAGNOSIS — Z23 Encounter for immunization: Secondary | ICD-10-CM | POA: Diagnosis not present

## 2021-12-24 DIAGNOSIS — Z1159 Encounter for screening for other viral diseases: Secondary | ICD-10-CM

## 2021-12-24 DIAGNOSIS — Z124 Encounter for screening for malignant neoplasm of cervix: Secondary | ICD-10-CM | POA: Diagnosis not present

## 2021-12-24 DIAGNOSIS — R0981 Nasal congestion: Secondary | ICD-10-CM

## 2021-12-24 DIAGNOSIS — Z13220 Encounter for screening for lipoid disorders: Secondary | ICD-10-CM

## 2021-12-24 DIAGNOSIS — Z131 Encounter for screening for diabetes mellitus: Secondary | ICD-10-CM

## 2021-12-24 LAB — CBC
HCT: 29.1 % — ABNORMAL LOW (ref 36.0–46.0)
Hemoglobin: 8.6 g/dL — ABNORMAL LOW (ref 12.0–15.0)
MCHC: 29.7 g/dL — ABNORMAL LOW (ref 30.0–36.0)
MCV: 63.8 fl — ABNORMAL LOW (ref 78.0–100.0)
Platelets: 451 10*3/uL — ABNORMAL HIGH (ref 150.0–400.0)
RBC: 4.55 Mil/uL (ref 3.87–5.11)
RDW: 19 % — ABNORMAL HIGH (ref 11.5–15.5)
WBC: 4.8 10*3/uL (ref 4.0–10.5)

## 2021-12-24 LAB — TSH: TSH: 0.97 u[IU]/mL (ref 0.35–5.50)

## 2021-12-24 LAB — HEMOGLOBIN A1C: Hgb A1c MFr Bld: 5.4 % (ref 4.6–6.5)

## 2021-12-24 LAB — FERRITIN: Ferritin: 2 ng/mL — ABNORMAL LOW (ref 10.0–291.0)

## 2021-12-25 ENCOUNTER — Ambulatory Visit (HOSPITAL_BASED_OUTPATIENT_CLINIC_OR_DEPARTMENT_OTHER)
Admission: RE | Admit: 2021-12-25 | Discharge: 2021-12-25 | Disposition: A | Discharge status: Home / Self Care | Payer: 59 | Source: Ambulatory Visit | Attending: Family Medicine | Admitting: Family Medicine

## 2021-12-25 ENCOUNTER — Encounter (HOSPITAL_BASED_OUTPATIENT_CLINIC_OR_DEPARTMENT_OTHER): Payer: Self-pay

## 2021-12-25 ENCOUNTER — Encounter: Payer: Self-pay | Admitting: Family Medicine

## 2021-12-25 DIAGNOSIS — Z1231 Encounter for screening mammogram for malignant neoplasm of breast: Secondary | ICD-10-CM | POA: Diagnosis not present

## 2021-12-25 LAB — HEPATITIS C ANTIBODY
Hepatitis C Ab: NONREACTIVE
SIGNAL TO CUT-OFF: 0.09 (ref <1.00)

## 2021-12-25 LAB — COMPREHENSIVE METABOLIC PANEL
ALT: 11 U/L (ref 0–35)
AST: 14 U/L (ref 0–37)
Albumin: 4 g/dL (ref 3.5–5.2)
Alkaline Phosphatase: 46 U/L (ref 39–117)
BUN: 13 mg/dL (ref 6–23)
CO2: 24 mEq/L (ref 19–32)
Calcium: 8.9 mg/dL (ref 8.4–10.5)
Chloride: 105 mEq/L (ref 96–112)
Creatinine, Ser: 0.58 mg/dL (ref 0.40–1.20)
GFR: 104.96 mL/min (ref >60.00)
Glucose, Bld: 102 mg/dL — ABNORMAL HIGH (ref 70–99)
Potassium: 4 mEq/L (ref 3.5–5.1)
Sodium: 138 mEq/L (ref 135–145)
Total Bilirubin: 0.3 mg/dL (ref 0.2–1.2)
Total Protein: 6.9 g/dL (ref 6.0–8.3)

## 2021-12-25 LAB — LIPID PANEL
Cholesterol: 170 mg/dL (ref 0–200)
HDL: 75.1 mg/dL (ref >39.00)
LDL Cholesterol: 85 mg/dL (ref 0–99)
NonHDL: 95.34
Total CHOL/HDL Ratio: 2
Triglycerides: 52 mg/dL (ref 0.0–149.0)
VLDL: 10.4 mg/dL (ref 0.0–40.0)

## 2021-12-25 NOTE — Addendum Note (Signed)
Addended by: [name] C on: 12/25/2021 01:01 PM ? ? Modules accepted: Orders ? ?

## 2021-12-26 ENCOUNTER — Encounter: Payer: Self-pay | Admitting: Family Medicine

## 2021-12-26 LAB — CYTOLOGY - PAP
Comment: NEGATIVE
Diagnosis: NEGATIVE
High risk HPV: NEGATIVE

## 2021-12-26 NOTE — Progress Notes (Signed)
[place] at [facility] [address] [place], [place] [address] [phone]  Date:  12/27/2021   Name:  Amy Terry   DOB:  1971/05/16   MRN:  [id number]  PCP:  [name], MD    Chief Complaint: Chest Pain   History of Present Illness:  Amy Terry is a 51 y.o. very pleasant female patient who presents with the following:  Patient seen today for concern of chest pain.  I saw her recently for physical, at which time she was noted to have significant anemia with hg of 8.6 After her visit, she mentioned chest pain and shortness of breath with activity to me via MyChart message, she is here today for further evaluation of this issue  She does have history of heart murmur, most recent EKG on chart from November 2020-this is the last time she was seen by cardiology, Dr. Johnsie Cancel She has known history of bicuspid aortic valve  Pt notes she has been craving ice -she suspected she might be low on iron She started on oral iron and is seeing hematology on Monday  Pt notes today that walking up stairs makes her SOB- she may need to stop and rest after a flight of stairs She has noted this for more than 6 months She may have some chest pain also when she has SOB-No CP otherwise   She most recently noted CP last week- she had to climb a hill at work  Chest pain and shortness of breath resolved after resting She has a cardiology appt coming up in June- I will get in touch with Dr Johnsie Cancel and see if he wants me to do anything else right now  Pt notes her sister had a hg of 7.5 and had to get an iron infusion as well Pt is not a vegetarian   Patient Active Problem List   Diagnosis Date Noted   Iron deficiency anemia 07/26/2014   Palpitations 07/21/2014   Allergic rhinitis 07/21/2014    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    Sinusitis    UTI (urinary tract infection)     Past Surgical History:  Procedure Laterality Date   CESAREAN SECTION     CESAREAN SECTION     TUBAL LIGATION     TUBAL LIGATION  2007    Social History   Tobacco Use   Smoking status: Never   Smokeless tobacco: Never  Substance Use Topics   Alcohol use: No    Alcohol/week: 0.0 standard drinks   Drug use: No    Family History  Problem Relation Age of Onset   Hypertension Mother    Emphysema Mother    Hypertension Father    Thyroid disease Sister    Breast cancer Sister    Hyperlipidemia Brother    Thyroid disease Paternal Aunt    Thyroid disease Sister    Asthma Sister    Anemia Sister    Thyroid disease Sister     No Known Allergies  Medication list has been reviewed and updated.  Current Outpatient Medications on File Prior to Visit  Medication Sig Dispense Refill   Chorionic Gonadotropin (HCG IJ) Inject as directed.     Phendimetrazine Tartrate 35 MG TABS Take by mouth.     No  current facility-administered medications on file prior to visit.    Review of Systems:  As per HPI- otherwise negative.   Physical Examination: Vitals:   12/27/21 1001  BP: 100/60  Pulse: 88  Resp: 18  Temp: 98.2 F (36.8 C)  SpO2: 99%   There were no vitals filed for this visit. There is no height or weight on file to calculate BMI. Ideal Body Weight:    BP Readings from Last 3 Encounters:  12/27/21 100/60  12/24/21 102/60  12/04/21 112/72    GEN: no acute distress.  Looks well, normal weight HEENT: Atraumatic, Normocephalic.  Ears and Nose: No external deformity. CV: Stable heart murmur, No M/G/R. No JVD. No thrill. No extra heart sounds. PULM: CTA B, no wheezes, crackles, rhonchi. No retractions. No resp. distress. No accessory muscle use. ABD: S, NT, ND, +BS. No rebound. No HSM. EXTR: No c/c/e PSYCH: Normally interactive. Conversant.   EKG: NSR rate 72 Assessment and Plan: Chest pain, unspecified type -  Plan: EKG 12-Lead, DG Chest 2 View  Patient seen today for follow-up of chest pain and shortness of breath, present with exertion for about 6 months.  However, she is also significantly anemic and is seeing hematology next week for treatment.   EKG is normal, symptoms are stable and not present at rest Most recent episode of chest pain was last week I have sent a message to her cardiologist-she has an appointment to be seen in about 1 month Can order any additional testing he might like now In the meantime, have asked her to avoid exercise until her anemia is treated.  Let me know if any worsening or changing of her symptoms  Signed [name], MD

## 2021-12-27 ENCOUNTER — Telehealth: Payer: Self-pay

## 2021-12-27 ENCOUNTER — Ambulatory Visit: Payer: 59 | Admitting: Family Medicine

## 2021-12-27 VITALS — BP 100/60 | HR 88 | Temp 98.2°F | Resp 18

## 2021-12-27 DIAGNOSIS — R079 Chest pain, unspecified: Secondary | ICD-10-CM

## 2021-12-27 DIAGNOSIS — I350 Nonrheumatic aortic (valve) stenosis: Secondary | ICD-10-CM

## 2021-12-27 DIAGNOSIS — Q2381 Bicuspid aortic valve: Secondary | ICD-10-CM

## 2021-12-27 DIAGNOSIS — Q231 Congenital insufficiency of aortic valve: Secondary | ICD-10-CM

## 2021-12-27 NOTE — Patient Instructions (Signed)
Great to see you - your EKG is normal Avoid exertion/ exercise until your low iron is treated If you are getting worse please let me know I will touch base with Dr Johnsie Cancel about any other testing he might want prior to your upcoming appt Please go over to [facility] and have a chest film at your earliest convenience- can be done as a walk in  Address: [address], [place], [place] [address] Phone: [phone]

## 2021-12-27 NOTE — Telephone Encounter (Signed)
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 Echo order placed.

## 2021-12-28 ENCOUNTER — Encounter: Payer: Self-pay | Admitting: Family Medicine

## 2021-12-28 ENCOUNTER — Ambulatory Visit
Admission: RE | Admit: 2021-12-28 | Discharge: 2021-12-28 | Disposition: A | Discharge status: Home / Self Care | Payer: 59 | Source: Ambulatory Visit | Attending: Family Medicine | Admitting: Family Medicine

## 2021-12-28 ENCOUNTER — Telehealth: Payer: Self-pay

## 2021-12-28 ENCOUNTER — Other Ambulatory Visit: Payer: Self-pay | Admitting: Family

## 2021-12-28 DIAGNOSIS — D509 Iron deficiency anemia, unspecified: Secondary | ICD-10-CM

## 2021-12-28 DIAGNOSIS — R0602 Shortness of breath: Secondary | ICD-10-CM | POA: Diagnosis not present

## 2021-12-28 DIAGNOSIS — R079 Chest pain, unspecified: Secondary | ICD-10-CM

## 2021-12-28 NOTE — Telephone Encounter (Signed)
Nurse called from imaging center to make [place] aware that the pts results have are ready to be viewed at this time.

## 2021-12-31 ENCOUNTER — Inpatient Hospital Stay: Payer: 59 | Admitting: Family

## 2021-12-31 ENCOUNTER — Inpatient Hospital Stay: Payer: 59 | Attending: Hematology & Oncology

## 2021-12-31 ENCOUNTER — Encounter: Payer: Self-pay | Admitting: Family

## 2021-12-31 VITALS — BP 103/68 | HR 92 | Temp 98.2°F | Resp 17 | Ht 62.0 in | Wt 139.8 lb

## 2021-12-31 DIAGNOSIS — D509 Iron deficiency anemia, unspecified: Secondary | ICD-10-CM | POA: Diagnosis not present

## 2021-12-31 LAB — CBC WITH DIFFERENTIAL (CANCER CENTER ONLY)
Abs Immature Granulocytes: 0.02 10*3/uL (ref 0.00–0.07)
Basophils Absolute: 0.1 10*3/uL (ref 0.0–0.1)
Basophils Relative: 1 %
Eosinophils Absolute: 0.1 10*3/uL (ref 0.0–0.5)
Eosinophils Relative: 2 %
HCT: 36.2 % (ref 36.0–46.0)
Hemoglobin: 10.2 g/dL — ABNORMAL LOW (ref 12.0–15.0)
Immature Granulocytes: 0 %
Lymphocytes Relative: 32 %
Lymphs Abs: 1.8 10*3/uL (ref 0.7–4.0)
MCH: 19.2 pg — ABNORMAL LOW (ref 26.0–34.0)
MCHC: 28.2 g/dL — ABNORMAL LOW (ref 30.0–36.0)
MCV: 68.2 fL — ABNORMAL LOW (ref 80.0–100.0)
Monocytes Absolute: 0.3 10*3/uL (ref 0.1–1.0)
Monocytes Relative: 5 %
Neutro Abs: 3.3 10*3/uL (ref 1.7–7.7)
Neutrophils Relative %: 60 %
Platelet Count: 475 10*3/uL — ABNORMAL HIGH (ref 150–400)
RBC: 5.31 MIL/uL — ABNORMAL HIGH (ref 3.87–5.11)
RDW: 20.4 % — ABNORMAL HIGH (ref 11.5–15.5)
WBC Count: 5.6 10*3/uL (ref 4.0–10.5)
nRBC: 0 % (ref 0.0–0.2)

## 2021-12-31 LAB — LACTATE DEHYDROGENASE: LDH: 169 U/L (ref 98–192)

## 2021-12-31 LAB — CMP (CANCER CENTER ONLY)
ALT: 12 U/L (ref 0–44)
AST: 15 U/L (ref 15–41)
Albumin: 4.2 g/dL (ref 3.5–5.0)
Alkaline Phosphatase: 43 U/L (ref 38–126)
Anion gap: 5 (ref 5–15)
BUN: 13 mg/dL (ref 6–20)
CO2: 28 mmol/L (ref 22–32)
Calcium: 9.4 mg/dL (ref 8.9–10.3)
Chloride: 103 mmol/L (ref 98–111)
Creatinine: 0.82 mg/dL (ref 0.44–1.00)
GFR, Estimated: >60 mL/min (ref >60)
Glucose, Bld: 99 mg/dL (ref 70–99)
Potassium: 3.8 mmol/L (ref 3.5–5.1)
Sodium: 136 mmol/L (ref 135–145)
Total Bilirubin: 0.4 mg/dL (ref 0.3–1.2)
Total Protein: 7.7 g/dL (ref 6.5–8.1)

## 2021-12-31 LAB — IRON AND IRON BINDING CAPACITY (CC-WL,HP ONLY)
Iron: 27 ug/dL — ABNORMAL LOW (ref 28–170)
Saturation Ratios: 5 % — ABNORMAL LOW (ref 10.4–31.8)
TIBC: 595 ug/dL — ABNORMAL HIGH (ref 250–450)
UIBC: 568 ug/dL — ABNORMAL HIGH (ref 148–442)

## 2021-12-31 LAB — RETICULOCYTES
Immature Retic Fract: 28 % — ABNORMAL HIGH (ref 2.3–15.9)
RBC.: 5.18 MIL/uL — ABNORMAL HIGH (ref 3.87–5.11)
Retic Count, Absolute: 125.9 10*3/uL (ref 19.0–186.0)
Retic Ct Pct: 2.4 % (ref 0.4–3.1)

## 2021-12-31 LAB — FERRITIN: Ferritin: 13 ng/mL (ref 11–307)

## 2021-12-31 LAB — SAVE SMEAR(SSMR), FOR PROVIDER SLIDE REVIEW

## 2021-12-31 NOTE — Progress Notes (Signed)
Hematology/Oncology Consultation   Name: Amy Terry      MRN: [id number]    Location: Room/bed info not found  Date: 12/31/2021 Time:11:18 AM   REFERRING PHYSICIAN:  [name], MD  REASON FOR CONSULT: Iron deficiency anemia    DIAGNOSIS: Iron deficiency anemia   HISTORY OF PRESENT ILLNESS: [name] is a very pleasant 51 yo female with long history of iron deficiency anemia unresponsive to oral iron supplementation.  Her ferritin was only 2.  She has not received IV iron in the past.  Her sister has received IV iron in the past for IDA.  She is symptomatic with fatigue, SOB, palpitations, chewing lots of ice and hearing her heartbeat in her ears.  She states that her cycle is irregular sometimes having breakthrough bleeding and sometimes skipping a month. She notes lots of large clots as well.  No other blood loss noted. No abnormal bruising, no petechiae.  She has 2 children. I c-section without any complications.  No personal history of cancer. Her sister had breast cancer and her paternal cousin had thyroid cancer.  She had her mammogram last week and results were negative.  No history of diabetes or thyroid disease.  No fever, chills, n/v, cough, rash, dizziness, chest pain, abdominal pain or changes in bowel or bladder habits.  No swelling or tenderness in her extremities at this time.  She wears compression stockings when working to prevent fluid retention.  She has occasional numbness and tingling in her fingertips when she wakes up, possibly positional.  No falls or syncope to report.  She has a good appetite and is staying well hydrated throughout the day. Her weight is 139 lbs.  No smoking, ETOH or recreational drug use.  She stays busy with her sweet family and also works as an Therapist, sports.   ROS: All other 10 point review of systems is negative.   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     Chorionic Gonadotropin (HCG IJ) Inject as directed.     ferrous sulfate 325 (65 FE) MG tablet Take 325 mg by mouth daily with breakfast.     melatonin 5 MG TABS Take 5 mg by mouth at bedtime as needed.     Phendimetrazine Tartrate 35 MG TABS Take by mouth.     No current facility-administered medications on file prior to visit.     PAST SURGICAL HISTORY Past Surgical History:  Procedure Laterality Date   CESAREAN SECTION     CESAREAN SECTION     TUBAL LIGATION     TUBAL LIGATION  2007    FAMILY HISTORY: Family History  Problem Relation Age of Onset   Hypertension Mother    Emphysema Mother    Hypertension Father    Thyroid disease Sister    Breast cancer Sister    Hyperlipidemia Brother    Thyroid disease Paternal 33    Thyroid disease Sister    Asthma Sister    Anemia Sister  Thyroid disease Sister     SOCIAL HISTORY:  reports that she has never smoked. She has never used smokeless tobacco. She reports that she does not drink alcohol and does not use drugs.  PERFORMANCE STATUS: The patient's performance status is 1 - Symptomatic but completely ambulatory  PHYSICAL EXAM: Most Recent Vital Signs: Blood pressure 103/68, pulse 92, temperature 98.2 F (36.8 C), temperature source Oral, resp. rate 17, height '5\' 2"'$  (1.575 m), weight 139 lb 12.8 oz (63.4 kg), SpO2 100 %. BP 103/68 (BP Location: Left Arm, Patient Position: Sitting)   Pulse 92   Temp 98.2 F (36.8 C) (Oral)   Resp 17   Ht '5\' 2"'$  (1.575 m)   Wt 139 lb 12.8 oz (63.4 kg)   SpO2 100%   BMI 25.57 kg/m   General Appearance:    Alert, cooperative, no distress, appears stated age   Head:    Normocephalic, without obvious abnormality, atraumatic  Eyes:    PERRL, conjunctiva/corneas clear, EOM's intact, fundi    benign, both eyes        Throat:   Lips, mucosa, and tongue normal; teeth and gums normal  Neck:   Supple, symmetrical, trachea midline, no adenopathy;    thyroid:  no enlargement/tenderness/nodules; no carotid   bruit or JVD  Back:     Symmetric, no curvature, ROM normal, no CVA tenderness  Lungs:     Clear to auscultation bilaterally, respirations unlabored  Chest Wall:    No tenderness or deformity   Heart:    Regular rate and rhythm, S1 and S2 normal, no murmur, rub   or gallop     Abdomen:     Soft, non-tender, bowel sounds active all four quadrants,    no masses, no organomegaly        Extremities:   Extremities normal, atraumatic, no cyanosis or edema  Pulses:   2+ and symmetric all extremities  Skin:   Skin color, texture, turgor normal, no rashes or lesions  Lymph nodes:   Cervical, supraclavicular, and axillary nodes normal  Neurologic:   CNII-XII intact, normal strength, sensation and reflexes    throughout    LABORATORY DATA:  Results for orders placed or performed in visit on 12/31/21 (from the past 48 hour(s))  CBC with Differential ([facility] Only)     Status: Abnormal (Preliminary result)   Collection Time: 12/31/21 10:54 AM  Result Value Ref Range   WBC Count 5.6 4.0 - 10.5 K/uL   RBC 5.31 (H) 3.87 - 5.11 MIL/uL   Hemoglobin 10.2 (L) 12.0 - 15.0 g/dL    Comment: Reticulocyte Hemoglobin testing may be clinically indicated, consider ordering this additional test [id number]    HCT 36.2 36.0 - 46.0 %   MCV 68.2 (L) 80.0 - 100.0 fL   MCH 19.2 (L) 26.0 - 34.0 pg   MCHC 28.2 (L) 30.0 - 36.0 g/dL   RDW 20.4 (H) 11.5 - 15.5 %   Platelet Count 475 (H) 150 - 400 K/uL   nRBC 0.0 0.0 - 0.2 %    Comment: Performed at [facility] at [facility], [address], [place], [place] [address]   Neutrophils  Relative % PENDING %   Neutro Abs PENDING 1.7 - 7.7 K/uL   Band Neutrophils PENDING %   Lymphocytes Relative PENDING %   Lymphs Abs PENDING 0.7 - 4.0 K/uL   Monocytes Relative PENDING %   Monocytes Absolute PENDING 0.1 - 1.0 K/uL  Eosinophils Relative PENDING %   Eosinophils Absolute PENDING 0.0 - 0.5 K/uL   Basophils Relative PENDING %   Basophils Absolute PENDING 0.0 - 0.1 K/uL   WBC Morphology PENDING    RBC Morphology PENDING    Smear Review PENDING    Other PENDING %   nRBC PENDING 0 /100 WBC   Metamyelocytes Relative PENDING %   Myelocytes PENDING %   Promyelocytes Relative PENDING %   Blasts PENDING %   Immature Granulocytes PENDING %   Abs Immature Granulocytes PENDING 0.00 - 0.07 K/uL  Reticulocytes     Status: Abnormal   Collection Time: 12/31/21 10:55 AM  Result Value Ref Range   Retic Ct Pct 2.4 0.4 - 3.1 %   RBC. 5.18 (H) 3.87 - 5.11 MIL/uL   Retic Count, Absolute 125.9 19.0 - 186.0 K/uL   Immature Retic Fract 28.0 (H) 2.3 - 15.9 %    Comment: Performed at [facility] at [facility], [address], [place], [place] [address]      RADIOGRAPHY: No results found.     PATHOLOGY: None  ASSESSMENT/PLAN: Ms. Battey is a very pleasant 51 yo female with long history of iron deficiency anemia unresponsive to oral iron supplementation.  Her anemia work up is pending.  We will get her set up for IV iron if needed.  She may also benefit from daily folic acid.  Follow-up in 8 weeks.   All questions were answered. The patient knows to call the clinic with any problems, questions or concerns. We can certainly see the patient much sooner if necessary.   Lottie Dawson, NP

## 2022-01-01 ENCOUNTER — Other Ambulatory Visit: Payer: Self-pay | Admitting: Family

## 2022-01-01 LAB — ERYTHROPOIETIN: Erythropoietin: 26.3 m[IU]/mL — ABNORMAL HIGH (ref 2.6–18.5)

## 2022-01-02 LAB — HGB FRACTIONATION CASCADE
Hgb A2: 2 % (ref 1.8–3.2)
Hgb A: 98 % (ref 96.4–98.8)
Hgb F: 0 % (ref 0.0–2.0)
Hgb S: 0 %

## 2022-01-04 ENCOUNTER — Telehealth: Payer: Self-pay | Admitting: *Deleted

## 2022-01-04 ENCOUNTER — Ambulatory Visit: Payer: 59

## 2022-01-04 NOTE — Telephone Encounter (Unsigned)
Per scheduling message Sarah - called and gave upcoming appointments - confirmed 

## 2022-01-09 LAB — ALPHA-THALASSEMIA GENOTYPR

## 2022-01-11 ENCOUNTER — Inpatient Hospital Stay: Payer: 59 | Attending: Hematology & Oncology

## 2022-01-11 VITALS — BP 105/73 | HR 70 | Temp 98.6°F | Resp 17

## 2022-01-11 DIAGNOSIS — D509 Iron deficiency anemia, unspecified: Secondary | ICD-10-CM | POA: Insufficient documentation

## 2022-01-11 DIAGNOSIS — Z1211 Encounter for screening for malignant neoplasm of colon: Secondary | ICD-10-CM | POA: Diagnosis not present

## 2022-01-11 MED ORDER — SODIUM CHLORIDE 0.9 % IV SOLN
300.0000 mg | Freq: Once | INTRAVENOUS | Status: AC
  Administered 2022-01-11: 300 mg via INTRAVENOUS
  Filled 2022-01-11: qty 300

## 2022-01-11 MED ORDER — SODIUM CHLORIDE 0.9 % IV SOLN: Freq: Once | INTRAVENOUS | Status: AC

## 2022-01-11 NOTE — Patient Instructions (Signed)
Iron Sucrose Injection (Venofer) What is this medication? IRON SUCROSE (EYE ern SOO krose) treats low levels of iron (iron deficiency anemia) in people with kidney disease. Iron is a mineral that plays an important role in making red blood cells, which carry oxygen from your lungs to the rest of your body. This medicine may be used for other purposes; ask your health care provider or pharmacist if you have questions. COMMON BRAND NAME(S): Venofer What should I tell my care team before I take this medication? They need to know if you have any of these conditions: Anemia not caused by low iron levels Heart disease High levels of iron in the blood Kidney disease Liver disease An unusual or allergic reaction to iron, other medications, foods, dyes, or preservatives Pregnant or trying to get pregnant Breast-feeding How should I use this medication? This medication is for infusion into a vein. It is given in a hospital or clinic setting. Talk to your care team about the use of this medication in children. While this medication may be prescribed for children as young as 2 years for selected conditions, precautions do apply. Overdosage: If you think you have taken too much of this medicine contact a poison control center or emergency room at once. NOTE: This medicine is only for you. Do not share this medicine with others. What if I miss a dose? It is important not to miss your dose. Call your care team if you are unable to keep an appointment. What may interact with this medication? Do not take this medication with any of the following: Deferoxamine Dimercaprol Other iron products This medication may also interact with the following: Chloramphenicol Deferasirox This list may not describe all possible interactions. Give your health care provider a list of all the medicines, herbs, non-prescription drugs, or dietary supplements you use. Also tell them if you smoke, drink alcohol, or use illegal  drugs. Some items may interact with your medicine. What should I watch for while using this medication? Visit your care team regularly. Tell your care team if your symptoms do not start to get better or if they get worse. You may need blood work done while you are taking this medication. You may need to follow a special diet. Talk to your care team. Foods that contain iron include: whole grains/cereals, dried fruits, beans, or peas, leafy green vegetables, and organ meats (liver, kidney). What side effects may I notice from receiving this medication? Side effects that you should report to your care team as soon as possible: Allergic reactions--skin rash, itching, hives, swelling of the face, lips, tongue, or throat Low blood pressure--dizziness, feeling faint or lightheaded, blurry vision Shortness of breath Side effects that usually do not require medical attention (report to your care team if they continue or are bothersome): Flushing Headache Joint pain Muscle pain Nausea Pain, redness, or irritation at injection site This list may not describe all possible side effects. Call your doctor for medical advice about side effects. You may report side effects to FDA at 1-800-FDA-1088. Where should I keep my medication? This medication is given in a hospital or clinic and will not be stored at home. NOTE: This sheet is a summary. It may not cover all possible information. If you have questions about this medicine, talk to your doctor, pharmacist, or health care provider.  2023 Elsevier/Gold Standard (2020-12-22 00:00:00)  

## 2022-01-18 ENCOUNTER — Inpatient Hospital Stay: Payer: 59

## 2022-01-18 ENCOUNTER — Ambulatory Visit (HOSPITAL_COMMUNITY): Payer: 59 | Attending: Cardiovascular Disease

## 2022-01-18 VITALS — BP 107/68 | HR 68 | Temp 98.2°F | Resp 17

## 2022-01-18 DIAGNOSIS — I350 Nonrheumatic aortic (valve) stenosis: Secondary | ICD-10-CM | POA: Diagnosis not present

## 2022-01-18 DIAGNOSIS — Q231 Congenital insufficiency of aortic valve: Secondary | ICD-10-CM | POA: Insufficient documentation

## 2022-01-18 DIAGNOSIS — D509 Iron deficiency anemia, unspecified: Secondary | ICD-10-CM

## 2022-01-18 LAB — ECHOCARDIOGRAM COMPLETE
AR max vel: 0.58 cm2
AV Area VTI: 0.51 cm2
AV Area mean vel: 0.55 cm2
AV Mean grad: 40 mmHg
AV Peak grad: 63 mmHg
Ao pk vel: 3.97 m/s
Area-P 1/2: 3.4 cm2
S' Lateral: 2.9 cm

## 2022-01-18 LAB — COLOGUARD: COLOGUARD: NEGATIVE

## 2022-01-18 MED ORDER — SODIUM CHLORIDE 0.9 % IV SOLN: Freq: Once | INTRAVENOUS | Status: AC

## 2022-01-18 MED ORDER — SODIUM CHLORIDE 0.9 % IV SOLN
300.0000 mg | Freq: Once | INTRAVENOUS | Status: AC
  Administered 2022-01-18: 300 mg via INTRAVENOUS
  Filled 2022-01-18: qty 300

## 2022-01-18 NOTE — Patient Instructions (Signed)

## 2022-01-20 ENCOUNTER — Encounter: Payer: Self-pay | Admitting: Family Medicine

## 2022-01-20 DIAGNOSIS — J189 Pneumonia, unspecified organism: Secondary | ICD-10-CM

## 2022-01-24 NOTE — Progress Notes (Signed)
Cardiology Office Note   Date:  01/24/2022   ID:  JACKI COUSE, DOB September 28, 1970, MRN [id number]   PCP:  [name], MD  Cardiologist:  Dr Johnsie Cancel    No chief complaint on file.     History of Present Illness: Amy Terry is a 51 y.o. female who presents for reestablish care. Requested by Dr Lorelei Pont as she has had more issues with GI bleeding History of bicuspid aortic valve.      Event monitor:   Reviewed from  April 2019 Just NSR no arrhythmia  (over 50 pages)   Echo 06/05/18 EF 60-65%  Bicuspid AV mean gradient 25 mmHg peak 46 mmHg moderate AS   07/06/18 Cardiac CTA: Aortic root 3.6 cm Calcium score 0 normal right dominant coronary arteries    Long history of iron deficiency anemia with ferritin only 2 Has not been responsive to oral iron Seen by hematology 12/31/21 set up for iv iron Hb 8.6 and has not had recent EGD/Colon  She is symptomatic with exertional dyspnea and chest pain   We facilitated TTE prior to this visit and reviewed EF normal 55-60% bicuspid AV with severe AS mean gradient 40 peak 63 mmHg DVI 0.16 and AVA 0.51 cm2  Long discussion about need for cath and referral to CVTS for symptomatic Severe AS Risks of cath including stroke, contrast reaction, bleeding intubation and emergency surgery discussed Willing to proceed  She is still working as a Marine scientist on [employer]  Has a 64 and 52 yo at home        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 (Patient not taking: Reported on 01/11/2022)     Chorionic  Gonadotropin (HCG IJ) Inject as directed.     ferrous sulfate 325 (65 FE) MG tablet Take 325 mg by mouth daily with breakfast.     melatonin 5 MG TABS Take 5 mg by mouth at bedtime as needed.     Phendimetrazine Tartrate 35 MG TABS Take by mouth.     No current facility-administered medications for this visit.    Allergies:   Patient has no known allergies.    Social History:  The patient  reports that she has never smoked. She has never used smokeless tobacco. She reports that she does not drink alcohol and does not use drugs.   Family History:  The patient's family history includes Anemia in her sister; Asthma in her sister; Breast cancer in her sister; Emphysema in her mother; Hyperlipidemia in her brother; Hypertension in her father and mother; Thyroid disease in her paternal aunt, sister, sister, and sister.    ROS:  General:no colds or fevers, no weight changes Skin:no rashes or ulcers HEENT:no blurred vision, no congestion CV:see HPI PUL:see HPI GI:no diarrhea constipation or melena, no indigestion GU:no hematuria, no dysuria MS:no joint pain, no claudication Neuro:no syncope, no lightheadedness Endo:no diabetes, no thyroid disease  Wt Readings from Last 3 Encounters:  12/31/21 139 lb 12.8 oz (63.4 kg)  12/24/21 142 lb 9.6 oz (64.7 kg)  12/04/21 149 lb 6.4 oz (67.8 kg)     PHYSICAL EXAM: VS:  There were no vitals taken for this visit. , BMI There is no height or weight on file to calculate BMI.  Affect appropriate Healthy:  appears stated age 42: normal Neck supple with no adenopathy JVP normal no bruits no thyromegaly Lungs clear with no wheezing and good diaphragmatic motion Heart:  S1/S2 AS  murmur, no rub, gallop or click PMI normal Abdomen: benighn, BS positve, no tenderness, no AAA no bruit.  No HSM or HJR Distal pulses intact with no bruits No edema Neuro non-focal Skin warm and dry No muscular weakness     EKG:   SR no changes from previous.     Recent Labs: 12/24/2021: TSH 0.97 12/31/2021: ALT 12; BUN 13; Creatinine 0.82; Hemoglobin 10.2; Platelet Count 475; Potassium 3.8; Sodium 136    Lipid Panel    Component Value Date/Time   CHOL 170 12/24/2021 1055   TRIG 52.0 12/24/2021 1055   HDL 75.10 12/24/2021 1055   CHOLHDL 2 12/24/2021 1055   VLDL 10.4 12/24/2021 1055   LDLCALC 85 12/24/2021 1055       Other studies Reviewed:  Echo 06/05/18  Echo 04/20/22  Cardiac CTA 07/06/18   ASSESSMENT AND PLAN:  1.  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  palpitations  Benign no arrhythmia on monitor   3. 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 Leg Pain:  Likely some venous engorgement on right with previous varicosities pulses good and ABI's normal 07/12/19    Pre cath labs  F/U with CVTS post cath for AVR   Signed, [name], MD  01/24/2022 1:05 PM    [facility] [place], [place], [place], [place] Phone: [phone]; Fax: [phone]

## 2022-01-25 ENCOUNTER — Inpatient Hospital Stay: Payer: 59

## 2022-01-25 ENCOUNTER — Other Ambulatory Visit: Payer: Self-pay | Admitting: Cardiovascular Disease

## 2022-01-25 ENCOUNTER — Encounter: Payer: Self-pay | Admitting: Cardiovascular Disease

## 2022-01-25 ENCOUNTER — Ambulatory Visit: Payer: 59 | Admitting: Cardiovascular Disease

## 2022-01-25 VITALS — BP 118/62 | HR 69 | Ht 62.0 in | Wt 137.0 lb

## 2022-01-25 VITALS — BP 105/75 | HR 66 | Temp 98.0°F | Resp 18

## 2022-01-25 DIAGNOSIS — I350 Nonrheumatic aortic (valve) stenosis: Secondary | ICD-10-CM

## 2022-01-25 DIAGNOSIS — D509 Iron deficiency anemia, unspecified: Secondary | ICD-10-CM | POA: Diagnosis not present

## 2022-01-25 DIAGNOSIS — Q231 Congenital insufficiency of aortic valve: Secondary | ICD-10-CM

## 2022-01-25 DIAGNOSIS — R002 Palpitations: Secondary | ICD-10-CM | POA: Diagnosis not present

## 2022-01-25 DIAGNOSIS — D649 Anemia, unspecified: Secondary | ICD-10-CM

## 2022-01-25 MED ORDER — SODIUM CHLORIDE 0.9 % IV SOLN
300.0000 mg | Freq: Once | INTRAVENOUS | Status: AC
  Administered 2022-01-25: 300 mg via INTRAVENOUS
  Filled 2022-01-25: qty 300

## 2022-01-25 MED ORDER — SODIUM CHLORIDE 0.9 % IV SOLN: Freq: Once | INTRAVENOUS | Status: AC

## 2022-01-25 MED ORDER — SODIUM CHLORIDE 0.9% FLUSH: 3.0000 mL | Freq: Two times a day (BID) | INTRAVENOUS | Status: DC

## 2022-01-25 NOTE — Patient Instructions (Signed)

## 2022-01-25 NOTE — Patient Instructions (Addendum)
Medication Instructions:  Your physician recommends that you continue on your current medications as directed. Please refer to the Current Medication list given to you today.  *If you need a refill on your cardiac medications before your next appointment, please call your pharmacy*  Lab Work: Your physician recommends that you have lab work today- BMET and CBC  If you have labs (blood work) drawn today and your tests are completely normal, you will receive your results only by: MyChart Message (if you have MyChart) OR A paper copy in the mail If you have any lab test that is abnormal or we need to change your treatment, we will call you to review the results.  Testing/Procedures: Your physician has requested that you have a cardiac catheterization. Cardiac catheterization is used to diagnose and/or treat various heart conditions. Doctors may recommend this procedure for a number of different reasons. The most common reason is to evaluate chest pain. Chest pain can be a symptom of coronary artery disease (CAD), and cardiac catheterization can show whether plaque is narrowing or blocking your heart's arteries. This procedure is also used to evaluate the valves, as well as measure the blood flow and oxygen levels in different parts of your heart. For further information please visit [email or web address].[email or web address]. Please follow instruction sheet, as given.  Follow-Up: At [facility], you and your health needs are our priority.  As part of our continuing mission to provide you with exceptional heart care, we have created designated Provider Care Teams.  These Care Teams include your primary Cardiologist (physician) and Advanced Practice Providers (APPs -  Physician Assistants and Nurse Practitioners) who all work together to provide you with the care you need, when you need it.  We recommend signing up for the patient portal called "MyChart".  Sign up information is provided on this After Visit Summary.   MyChart is used to connect with patients for Virtual Visits (Telemedicine).  Patients are able to view lab/test results, encounter notes, upcoming appointments, etc.  Non-urgent messages can be sent to your provider as well.   To learn more about what you can do with MyChart, go to [email or web address].    Your next appointment:   2 week(s)  The format for your next appointment:   In Person  Provider:   Robbie Lis, PA-C, Nicholes Rough, PA-C, Melina Copa, PA-C, Ambrose Pancoast, NP, Cecilie Kicks, NP, Ermalinda Barrios, PA-C, Christen Bame, NP, Richardson Dopp, PA-C, or Margie Billet, NP     Then, [name], MD will plan to see you again in 6 month(s).{  You have been referred to Dr. [name] as soon as possible.   Other Instructions  [facility] [place], [place] [address] Dept: [phone] Loc: [phone]  Amy Terry  01/25/2022  You are scheduled for a Cardiac Catheterization on Thursday, June 22 with Dr. Ali Lowe  1. Please arrive at the Main Entrance A at [facility]: [place],  [address] at 5:30 am (This time is two hours before your procedure to ensure your preparation). Free valet parking service is available.   Special note: Every effort is made to have your procedure done on time. Please understand that emergencies sometimes delay scheduled procedures.  2. Diet: Do not eat solid foods after midnight.  You may have clear liquids until 5 AM upon the day of the procedure.  3. Labs: You will need to have blood drawn  on, June 16 at [facility]. [address]  Open: 7:30am - 5pm    Phone: [phone]. You do not need to be fasting.  4. Medication instructions in preparation for your procedure:   Contrast Allergy: No  On the morning of your procedure, take Aspirin and any morning medicines NOT listed  above.  You may use sips of water.  5. Plan to go home the same day, you will only stay overnight if medically necessary. 6. You MUST have a responsible adult to drive you home. 7. An adult MUST be with you the first 24 hours after you arrive home. 8. Bring a current list of your medications, and the last time and date medication taken. 9. Bring ID and current insurance cards. 10.Please wear clothes that are easy to get on and off and wear slip-on shoes.  Thank you for allowing [place] to care for you!   -- [facility]   Important Information About Sugar

## 2022-01-26 LAB — CBC WITH DIFFERENTIAL/PLATELET
Basophils Absolute: 0.1 10*3/uL (ref 0.0–0.2)
Basos: 1 %
EOS (ABSOLUTE): 0.1 10*3/uL (ref 0.0–0.4)
Eos: 3 %
Hematocrit: 39.5 % (ref 34.0–46.6)
Hemoglobin: 12 g/dL (ref 11.1–15.9)
Immature Grans (Abs): 0 10*3/uL (ref 0.0–0.1)
Immature Granulocytes: 0 %
Lymphocytes Absolute: 1.9 10*3/uL (ref 0.7–3.1)
Lymphs: 44 %
MCH: 23 pg — ABNORMAL LOW (ref 26.6–33.0)
MCHC: 30.4 g/dL — ABNORMAL LOW (ref 31.5–35.7)
MCV: 76 fL — ABNORMAL LOW (ref 79–97)
Monocytes Absolute: 0.4 10*3/uL (ref 0.1–0.9)
Monocytes: 8 %
Neutrophils Absolute: 1.9 10*3/uL (ref 1.4–7.0)
Neutrophils: 44 %
Platelets: 300 10*3/uL (ref 150–450)
RBC: 5.21 x10E6/uL (ref 3.77–5.28)
WBC: 4.3 10*3/uL (ref 3.4–10.8)

## 2022-01-26 LAB — BASIC METABOLIC PANEL
BUN/Creatinine Ratio: 17 (ref 9–23)
BUN: 10 mg/dL (ref 6–24)
CO2: 27 mmol/L (ref 20–29)
Calcium: 9.3 mg/dL (ref 8.7–10.2)
Chloride: 102 mmol/L (ref 96–106)
Creatinine, Ser: 0.6 mg/dL (ref 0.57–1.00)
Glucose: 99 mg/dL (ref 70–99)
Potassium: 4.2 mmol/L (ref 3.5–5.2)
Sodium: 141 mmol/L (ref 134–144)
eGFR: 109 mL/min/{1.73_m2} (ref >59)

## 2022-01-30 ENCOUNTER — Telehealth: Payer: Self-pay | Admitting: *Deleted

## 2022-01-30 NOTE — Telephone Encounter (Addendum)
Cardiac Catheterization scheduled at [facility] for: Thursday January 31, 2022 7:30 AM Arrival time and place: [place] Entrance A at: 5:30 AM   Nothing to eat after midnight prior to procedure, clear liquids until 5 AM day of procedure.  Medication instructions: -Usual morning medications can be taken with sips of water including aspirin 81 mg.  Confirmed patient has responsible adult to drive home post procedure and be with patient first 24 hours after arriving home.  Patient reports no new symptoms concerning for COVID-19 in the past 10 days.  Reviewed procedure instructions with patient.

## 2022-01-31 ENCOUNTER — Ambulatory Visit (HOSPITAL_COMMUNITY)
Admission: RE | Admit: 2022-01-31 | Discharge: 2022-01-31 | Disposition: A | Discharge status: Home / Self Care | Payer: 59 | Attending: Internal Medicine | Admitting: Internal Medicine

## 2022-01-31 ENCOUNTER — Encounter (HOSPITAL_COMMUNITY): Payer: Self-pay | Admitting: Internal Medicine

## 2022-01-31 ENCOUNTER — Encounter (HOSPITAL_COMMUNITY)
Admission: RE | Disposition: A | Discharge status: Home / Self Care | Payer: Self-pay | Source: Home / Self Care | Attending: Internal Medicine

## 2022-01-31 ENCOUNTER — Other Ambulatory Visit: Payer: Self-pay

## 2022-01-31 DIAGNOSIS — D649 Anemia, unspecified: Secondary | ICD-10-CM | POA: Diagnosis not present

## 2022-01-31 DIAGNOSIS — I350 Nonrheumatic aortic (valve) stenosis: Secondary | ICD-10-CM | POA: Insufficient documentation

## 2022-01-31 DIAGNOSIS — Q230 Congenital stenosis of aortic valve: Secondary | ICD-10-CM | POA: Diagnosis not present

## 2022-01-31 DIAGNOSIS — M79604 Pain in right leg: Secondary | ICD-10-CM | POA: Insufficient documentation

## 2022-01-31 DIAGNOSIS — R002 Palpitations: Secondary | ICD-10-CM | POA: Insufficient documentation

## 2022-01-31 HISTORY — PX: RIGHT HEART CATH AND CORONARY ANGIOGRAPHY: CATH118264

## 2022-01-31 LAB — POCT I-STAT EG7
Acid-Base Excess: 0 mmol/L (ref 0.0–2.0)
Acid-Base Excess: 0 mmol/L (ref 0.0–2.0)
Acid-base deficit: 1 mmol/L (ref 0.0–2.0)
Acid-base deficit: 1 mmol/L (ref 0.0–2.0)
Acid-base deficit: 2 mmol/L (ref 0.0–2.0)
Acid-base deficit: 1 mmol/L (ref 0.0–2.0)
Acid-base deficit: 1 mmol/L (ref 0.0–2.0)
Acid-base deficit: 1 mmol/L (ref 0.0–2.0)
Acid-base deficit: 1 mmol/L (ref 0.0–2.0)
Bicarbonate: 24.7 mmol/L (ref 20.0–28.0)
Bicarbonate: 25.5 mmol/L (ref 20.0–28.0)
Bicarbonate: 26.4 mmol/L (ref 20.0–28.0)
Bicarbonate: 24.5 mmol/L (ref 20.0–28.0)
Bicarbonate: 24.7 mmol/L (ref 20.0–28.0)
Bicarbonate: 25.3 mmol/L (ref 20.0–28.0)
Bicarbonate: 26 mmol/L (ref 20.0–28.0)
Bicarbonate: 26 mmol/L (ref 20.0–28.0)
Bicarbonate: 26.1 mmol/L (ref 20.0–28.0)
Calcium, Ion: 1.15 mmol/L (ref 1.15–1.40)
Calcium, Ion: 1.21 mmol/L (ref 1.15–1.40)
Calcium, Ion: 1.21 mmol/L (ref 1.15–1.40)
Calcium, Ion: 1.12 mmol/L — ABNORMAL LOW (ref 1.15–1.40)
Calcium, Ion: 1.14 mmol/L — ABNORMAL LOW (ref 1.15–1.40)
Calcium, Ion: 1.17 mmol/L (ref 1.15–1.40)
Calcium, Ion: 1.19 mmol/L (ref 1.15–1.40)
Calcium, Ion: 1.2 mmol/L (ref 1.15–1.40)
Calcium, Ion: 1.21 mmol/L (ref 1.15–1.40)
HCT: 33 % — ABNORMAL LOW (ref 36.0–46.0)
HCT: 34 % — ABNORMAL LOW (ref 36.0–46.0)
HCT: 36 % (ref 36.0–46.0)
HCT: 32 % — ABNORMAL LOW (ref 36.0–46.0)
HCT: 33 % — ABNORMAL LOW (ref 36.0–46.0)
HCT: 34 % — ABNORMAL LOW (ref 36.0–46.0)
HCT: 35 % — ABNORMAL LOW (ref 36.0–46.0)
HCT: 35 % — ABNORMAL LOW (ref 36.0–46.0)
HCT: 36 % (ref 36.0–46.0)
Hemoglobin: 11.2 g/dL — ABNORMAL LOW (ref 12.0–15.0)
Hemoglobin: 11.6 g/dL — ABNORMAL LOW (ref 12.0–15.0)
Hemoglobin: 12.2 g/dL (ref 12.0–15.0)
Hemoglobin: 10.9 g/dL — ABNORMAL LOW (ref 12.0–15.0)
Hemoglobin: 11.2 g/dL — ABNORMAL LOW (ref 12.0–15.0)
Hemoglobin: 11.6 g/dL — ABNORMAL LOW (ref 12.0–15.0)
Hemoglobin: 11.9 g/dL — ABNORMAL LOW (ref 12.0–15.0)
Hemoglobin: 11.9 g/dL — ABNORMAL LOW (ref 12.0–15.0)
Hemoglobin: 12.2 g/dL (ref 12.0–15.0)
O2 Saturation: 73 %
O2 Saturation: 75 %
O2 Saturation: 77 %
O2 Saturation: 71 %
O2 Saturation: 71 %
O2 Saturation: 75 %
O2 Saturation: 76 %
O2 Saturation: 76 %
O2 Saturation: 96 %
Potassium: 3.4 mmol/L — ABNORMAL LOW (ref 3.5–5.1)
Potassium: 3.6 mmol/L (ref 3.5–5.1)
Potassium: 3.7 mmol/L (ref 3.5–5.1)
Potassium: 3.3 mmol/L — ABNORMAL LOW (ref 3.5–5.1)
Potassium: 3.4 mmol/L — ABNORMAL LOW (ref 3.5–5.1)
Potassium: 3.5 mmol/L (ref 3.5–5.1)
Potassium: 3.5 mmol/L (ref 3.5–5.1)
Potassium: 3.5 mmol/L (ref 3.5–5.1)
Potassium: 3.6 mmol/L (ref 3.5–5.1)
Sodium: 139 mmol/L (ref 135–145)
Sodium: 140 mmol/L (ref 135–145)
Sodium: 141 mmol/L (ref 135–145)
Sodium: 140 mmol/L (ref 135–145)
Sodium: 140 mmol/L (ref 135–145)
Sodium: 140 mmol/L (ref 135–145)
Sodium: 141 mmol/L (ref 135–145)
Sodium: 141 mmol/L (ref 135–145)
Sodium: 142 mmol/L (ref 135–145)
TCO2: 26 mmol/L (ref 22–32)
TCO2: 27 mmol/L (ref 22–32)
TCO2: 28 mmol/L (ref 22–32)
TCO2: 26 mmol/L (ref 22–32)
TCO2: 26 mmol/L (ref 22–32)
TCO2: 27 mmol/L (ref 22–32)
TCO2: 27 mmol/L (ref 22–32)
TCO2: 28 mmol/L (ref 22–32)
TCO2: 28 mmol/L (ref 22–32)
pCO2, Ven: 47.2 mmHg (ref 44–60)
pCO2, Ven: 48.6 mmHg (ref 44–60)
pCO2, Ven: 52 mmHg (ref 44–60)
pCO2, Ven: 44.2 mmHg (ref 44–60)
pCO2, Ven: 46.8 mmHg (ref 44–60)
pCO2, Ven: 48.1 mmHg (ref 44–60)
pCO2, Ven: 50 mmHg (ref 44–60)
pCO2, Ven: 50 mmHg (ref 44–60)
pCO2, Ven: 51.3 mmHg (ref 44–60)
pH, Ven: 7.314 (ref 7.25–7.43)
pH, Ven: 7.327 (ref 7.25–7.43)
pH, Ven: 7.329 (ref 7.25–7.43)
pH, Ven: 7.312 (ref 7.25–7.43)
pH, Ven: 7.313 (ref 7.25–7.43)
pH, Ven: 7.327 (ref 7.25–7.43)
pH, Ven: 7.331 (ref 7.25–7.43)
pH, Ven: 7.34 (ref 7.25–7.43)
pH, Ven: 7.352 (ref 7.25–7.43)
pO2, Ven: 42 mmHg (ref 32–45)
pO2, Ven: 44 mmHg (ref 32–45)
pO2, Ven: 46 mmHg — ABNORMAL HIGH (ref 32–45)
pO2, Ven: 41 mmHg (ref 32–45)
pO2, Ven: 41 mmHg (ref 32–45)
pO2, Ven: 43 mmHg (ref 32–45)
pO2, Ven: 44 mmHg (ref 32–45)
pO2, Ven: 44 mmHg (ref 32–45)
pO2, Ven: 88 mmHg — ABNORMAL HIGH (ref 32–45)

## 2022-01-31 LAB — POCT I-STAT 7, (LYTES, BLD GAS, ICA,H+H)
Acid-Base Excess: 0 mmol/L (ref 0.0–2.0)
Bicarbonate: 25.7 mmol/L (ref 20.0–28.0)
Calcium, Ion: 1.16 mmol/L (ref 1.15–1.40)
HCT: 36 % (ref 36.0–46.0)
Hemoglobin: 12.2 g/dL (ref 12.0–15.0)
O2 Saturation: 96 %
Potassium: 3.7 mmol/L (ref 3.5–5.1)
Sodium: 140 mmol/L (ref 135–145)
TCO2: 27 mmol/L (ref 22–32)
pCO2 arterial: 45.9 mmHg (ref 32–48)
pH, Arterial: 7.356 (ref 7.35–7.45)
pO2, Arterial: 90 mmHg (ref 83–108)

## 2022-01-31 SURGERY — RIGHT HEART CATH AND CORONARY ANGIOGRAPHY
Anesthesia: LOCAL

## 2022-01-31 MED ORDER — HEPARIN SODIUM (PORCINE) 1000 UNIT/ML IJ SOLN
INTRAMUSCULAR | Status: DC | PRN
  Administered 2022-01-31: 5000 [IU] via INTRAVENOUS

## 2022-01-31 MED ORDER — SODIUM CHLORIDE 0.9% FLUSH: 3.0000 mL | Freq: Two times a day (BID) | INTRAVENOUS | Status: DC

## 2022-01-31 MED ORDER — SODIUM CHLORIDE 0.9 % IV SOLN: INTRAVENOUS | Status: DC

## 2022-01-31 MED ORDER — SODIUM CHLORIDE 0.9 % IV SOLN: 250.0000 mL | INTRAVENOUS | Status: DC | PRN

## 2022-01-31 MED ORDER — ASPIRIN 81 MG PO CHEW: 81.0000 mg | CHEWABLE_TABLET | ORAL | Status: DC

## 2022-01-31 MED ORDER — IOHEXOL 350 MG/ML SOLN
INTRAVENOUS | Status: DC | PRN
  Administered 2022-01-31: 30 mL

## 2022-01-31 MED ORDER — LIDOCAINE HCL (PF) 1 % IJ SOLN
INTRAMUSCULAR | Status: DC | PRN
  Administered 2022-01-31 (×2): 2 mL

## 2022-01-31 MED ORDER — VERAPAMIL HCL 2.5 MG/ML IV SOLN: INTRAVENOUS | Status: DC | PRN

## 2022-01-31 MED ORDER — SODIUM CHLORIDE 0.9% FLUSH: 3.0000 mL | INTRAVENOUS | Status: DC | PRN

## 2022-01-31 MED ORDER — MIDAZOLAM HCL 2 MG/2ML IJ SOLN
INTRAMUSCULAR | Status: DC | PRN
  Administered 2022-01-31: 1 mg via INTRAVENOUS

## 2022-01-31 MED ORDER — VERAPAMIL HCL 2.5 MG/ML IV SOLN
INTRAVENOUS | Status: AC
  Filled 2022-01-31: qty 2

## 2022-01-31 MED ORDER — MIDAZOLAM HCL 2 MG/2ML IJ SOLN
INTRAMUSCULAR | Status: AC
  Filled 2022-01-31: qty 2

## 2022-01-31 MED ORDER — ONDANSETRON HCL 4 MG/2ML IJ SOLN: 4.0000 mg | Freq: Four times a day (QID) | INTRAMUSCULAR | Status: DC | PRN

## 2022-01-31 MED ORDER — LIDOCAINE HCL (PF) 1 % IJ SOLN
INTRAMUSCULAR | Status: AC
  Filled 2022-01-31: qty 30

## 2022-01-31 MED ORDER — LABETALOL HCL 5 MG/ML IV SOLN: 10.0000 mg | INTRAVENOUS | Status: DC | PRN

## 2022-01-31 MED ORDER — HYDRALAZINE HCL 20 MG/ML IJ SOLN: 10.0000 mg | INTRAMUSCULAR | Status: DC | PRN

## 2022-01-31 MED ORDER — ACETAMINOPHEN 325 MG PO TABS: 650.0000 mg | ORAL_TABLET | ORAL | Status: DC | PRN

## 2022-01-31 MED ORDER — HEPARIN (PORCINE) IN NACL 1000-0.9 UT/500ML-% IV SOLN
INTRAVENOUS | Status: AC
  Filled 2022-01-31: qty 1000

## 2022-01-31 MED ORDER — SODIUM CHLORIDE 0.9 % WEIGHT BASED INFUSION
3.0000 mL/kg/h | INTRAVENOUS | Status: AC
  Administered 2022-01-31: 3 mL/kg/h via INTRAVENOUS

## 2022-01-31 MED ORDER — HEPARIN SODIUM (PORCINE) 1000 UNIT/ML IJ SOLN
INTRAMUSCULAR | Status: AC
  Filled 2022-01-31: qty 10

## 2022-01-31 MED ORDER — SODIUM CHLORIDE 0.9 % WEIGHT BASED INFUSION: 1.0000 mL/kg/h | INTRAVENOUS | Status: DC

## 2022-01-31 MED ORDER — HEPARIN (PORCINE) IN NACL 1000-0.9 UT/500ML-% IV SOLN
INTRAVENOUS | Status: DC | PRN
  Administered 2022-01-31 (×2): 500 mL

## 2022-01-31 SURGICAL SUPPLY — 12 items
CATH BALLN WEDGE 5F 110CM (CATHETERS) ×1 IMPLANT
CATH DIAG 6FR JR4 (CATHETERS) ×1 IMPLANT
CATH INFINITI 6F FL3.5 (CATHETERS) ×1 IMPLANT
GLIDESHEATH SLEND SS 6F .021 (SHEATH) ×1 IMPLANT
GUIDEWIRE INQWIRE 1.5J.035X260 (WIRE) IMPLANT
INQWIRE 1.5J .035X260CM (WIRE) ×2
KIT HEART LEFT (KITS) ×2 IMPLANT
PACK CARDIAC CATHETERIZATION (CUSTOM PROCEDURE TRAY) ×2 IMPLANT
SHEATH GLIDE SLENDER 4/5FR (SHEATH) ×1 IMPLANT
SHEATH PINNACLE 5F 10CM (SHEATH) IMPLANT
TRANSDUCER W/STOPCOCK (MISCELLANEOUS) ×2 IMPLANT
TUBING CIL FLEX 10 FLL-RA (TUBING) ×2 IMPLANT

## 2022-01-31 NOTE — Interval H&P Note (Signed)
History and Physical Interval Note:  01/31/2022 7:02 AM  Amy Terry  has presented today for surgery, with the diagnosis of severe aortic stenosis.  The various methods of treatment have been discussed with the patient and family. After consideration of risks, benefits and other options for treatment, the patient has consented to  Procedure(s): RIGHT/LEFT HEART CATH AND CORONARY ANGIOGRAPHY (N/A) as a surgical intervention.  The patient's history has been reviewed, patient examined, no change in status, stable for surgery.  I have reviewed the patient's chart and labs.  Questions were answered to the patient's satisfaction.     Early Osmond

## 2022-02-04 ENCOUNTER — Other Ambulatory Visit: Payer: Self-pay | Admitting: Surgery

## 2022-02-04 ENCOUNTER — Institutional Professional Consult (permissible substitution): Payer: 59 | Admitting: Surgery

## 2022-02-04 ENCOUNTER — Encounter: Payer: Self-pay | Admitting: Surgery

## 2022-02-04 VITALS — BP 102/67 | HR 93 | Resp 20 | Ht 62.0 in | Wt 138.7 lb

## 2022-02-04 DIAGNOSIS — I350 Nonrheumatic aortic (valve) stenosis: Secondary | ICD-10-CM

## 2022-02-04 DIAGNOSIS — Q230 Congenital stenosis of aortic valve: Secondary | ICD-10-CM

## 2022-02-05 ENCOUNTER — Ambulatory Visit: Payer: 59 | Admitting: Nurse Practitioner

## 2022-02-05 ENCOUNTER — Encounter: Payer: Self-pay | Admitting: Nurse Practitioner

## 2022-02-05 ENCOUNTER — Encounter: Payer: Self-pay | Admitting: *Deleted

## 2022-02-05 VITALS — BP 118/72 | HR 87 | Ht 62.0 in | Wt 138.8 lb

## 2022-02-05 DIAGNOSIS — I350 Nonrheumatic aortic (valve) stenosis: Secondary | ICD-10-CM

## 2022-02-05 DIAGNOSIS — Q231 Congenital insufficiency of aortic valve: Secondary | ICD-10-CM

## 2022-02-06 NOTE — Progress Notes (Signed)
Patient ID: Amy Terry, female   DOB: Jan 28, 1971, 51 y.o.   MRN: [id number]  [facility]         [address]       [place] [address]             [phone]          CARDIOTHORACIC SURGERY CONSULTATION REPORT  PCP is Copland, Gay Filler, MD Referring Provider is [name], MD Primary Cardiologist is [name], MD  Reason for consultation: Severe bicuspid aortic valve stenosis  HPI:  The patient is a 51 year old woman with a long history of bicuspid aortic valve stenosis that was noted to be moderate in October 2019 with a mean gradient of 25 mmHg and a peak of 46 mmHg.  She was asymptomatic at that time.  She had a follow-up echocardiogram in December 2020 which showed no significant change in the mean gradient at 20 mmHg with a valve area of 1.0 cm.  Left ventricular ejection fraction was normal.  She remained asymptomatic.  She also has a long history of iron deficiency anemia that has not been responsive to oral iron and she saw hematology in May and was set up for IV iron.  Her hemoglobin was 8.6 on 12/24/2021 but after iron therapy her hemoglobin has increased to 12.2 on 01/31/2022.  She has developed exertional shortness of breath, fatigue, and chest discomfort with walking up inclines or stairs over the past several months but said this has improved since her hemoglobin has improved.  Her most recent echo on 01/18/2022 showed an increase in the mean gradient across the aortic valve to 40 mmHg with a peak gradient of 63 mmHg.  Valve area by VTI 0.51 cm consistent with severe aortic stenosis.  Left ventricular ejection fraction is 50 to 55%.  There is trivial mitral valve regurgitation and mild to moderate tricuspid valve regurgitation.  She underwent cardiac catheterization on 01/31/2022 showing a normal right dominant coronary circulation without significant stenosis.  Filling pressures were low with a  cardiac index of 3.8.  She had a CT of the chest without contrast in April 2008 that showed the ascending aorta to measure 3.0 cm.  An MRA of the chest in May 2016 showed the diameter of the ascending aorta to measure 3.3 cm.  A gated cardiac CTA in 06/2018 showed the ascending aorta to have a diameter of 3.6 cm with a descending aortic diameter of 1.9 cm.  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  She is originally from the [place] and has a 51 year old and 51 year old at home.   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   Sinusitis    UTI (urinary tract infection)     Past Surgical History:  Procedure Laterality Date   CESAREAN SECTION     CESAREAN SECTION     RIGHT HEART CATH AND CORONARY ANGIOGRAPHY N/A 01/31/2022   Procedure: RIGHT HEART CATH AND CORONARY ANGIOGRAPHY;  Surgeon: Early Osmond, MD;  Location: [facility];  Service: Cardiovascular;  Laterality: N/A;   TUBAL LIGATION     TUBAL LIGATION  2007    Family History  Problem Relation Age of Onset   Hypertension Mother    Emphysema Mother    Hypertension  Father    Thyroid disease Sister    Breast cancer Sister    Hyperlipidemia Brother    Thyroid disease Paternal Aunt    Thyroid disease Sister    Asthma Sister    Anemia Sister    Thyroid disease Sister     Social History   Socioeconomic History   Marital status: Married    Spouse name: Not on file   Number of children: Not on file   Years of education: Not on file   Highest education level: Not on file  Occupational History   Not on file  Tobacco Use   Smoking status: Never   Smokeless tobacco: Never  Substance and Sexual Activity   Alcohol use: No    Alcohol/week: 0.0 standard drinks of alcohol   Drug use: No   Sexual activity: Yes    Partners: Male  Other Topics Concern   Not on file  Social History Narrative   Not on file   Social Determinants  of Health   Financial Resource Strain: Not on file  Food Insecurity: Not on file  Transportation Needs: Not on file  Physical Activity: Not on file  Stress: Not on file  Social Connections: Not on file  Intimate Partner Violence: Not on file    Prior to Admission medications   Medication Sig Start Date End Date Taking? Authorizing Provider  acetaminophen (TYLENOL) 500 MG tablet Take 1,000 mg by mouth every 6 (six) hours as needed for headache or moderate pain.   Yes [provider]  ferrous sulfate 325 (65 FE) MG tablet Take 325 mg by mouth daily with breakfast.   Yes [provider]  fluticasone (FLONASE) 50 MCG/ACT nasal spray Place 1 spray into both nostrils daily as needed for allergies or rhinitis.   Yes [provider]  melatonin 5 MG TABS Take 5 mg by mouth at bedtime as needed (sleep).   Yes [provider]  Phendimetrazine Tartrate 35 MG TABS Take 35 mg by mouth 4 (four) times a week.   Yes [provider]    Current Outpatient Medications  Medication Sig Dispense Refill   acetaminophen (TYLENOL) 500 MG tablet Take 1,000 mg by mouth every 6 (six) hours as needed for headache or moderate pain.     ferrous sulfate 325 (65 FE) MG tablet Take 325 mg by mouth daily with breakfast.     fluticasone (FLONASE) 50 MCG/ACT nasal spray Place 1 spray into both nostrils daily as needed for allergies or rhinitis.     melatonin 5 MG TABS Take 5 mg by mouth at bedtime as needed (sleep).     Phendimetrazine Tartrate 35 MG TABS Take 35 mg by mouth 4 (four) times a week.     Current Facility-Administered Medications  Medication Dose Route Frequency Provider Last Rate Last Admin   sodium chloride flush (NS) 0.9 % injection 3 mL  3 mL Intravenous Q12H Josue Hector, MD        Allergies  Allergen Reactions   Other Rash    Tegaderm      Review of Systems:   General:  normal appetite, + decreased energy, no weight gain, no weight loss, no  fever  Cardiac:  + chest tightness with exertion, no chest pain at rest, +SOB with moderate exertion, no resting SOB, no PND, no orthopnea, + palpitations, no arrhythmia, no atrial fibrillation, no LE edema, no dizzy spells, no syncope  Respiratory:  + exertional shortness of breath, no home oxygen,  no productive cough, no dry cough, no bronchitis, no wheezing, no hemoptysis, no asthma, no pain with inspiration or cough, no sleep apnea, no CPAP at night  GI:   no difficulty swallowing, no reflux, no frequent heartburn, no hiatal hernia, no abdominal pain, no constipation, no diarrhea, no hematochezia, no hematemesis, no melena  GU:   no dysuria,  no frequency, + urinary tract infection, no hematuria,  no kidney stones, no kidney disease  Vascular:  no pain suggestive of claudication, no pain in feet, no leg cramps, + varicose veins, no DVT, no non-healing foot ulcer  Neuro:   no stroke, no TIA's, no seizures, no headaches, no temporary blindness one eye,  no slurred speech, no peripheral neuropathy, no chronic pain, no instability of gait, no memory/cognitive dysfunction  Musculoskeletal: no arthritis, no joint swelling, no myalgias, no difficulty walking, normal mobility   Skin:   no rash, no itching, no skin infections, no pressure sores or ulcerations  Psych:   no anxiety, no depression, no nervousness, no unusual recent stress  Eyes:   no blurry vision, no floaters, no recent vision changes, + wears glasses   ENT:   no hearing loss, no loose or painful teeth, no dentures, last saw dentist this year  Hematologic:  no easy bruising, no abnormal bleeding, no clotting disorder, no frequent epistaxis  Endocrine:  no diabetes, does not check CBG's at home     Physical Exam:   BP 102/67 (BP Location: Left Arm, Patient Position: Sitting, Cuff Size: Normal)   Pulse 93   Resp 20   Ht '5\' 2"'$  (1.575 m)   Wt 138 lb 11.2 oz (62.9 kg)   SpO2 96%   BMI 25.37 kg/m    General:  well-appearing  HEENT:  Unremarkable, NCAT, PERLA, EOMI, teeth in good condition  Neck:   no JVD, no bruits, no adenopathy   Chest:   clear to auscultation, symmetrical breath sounds, no wheezes, no rhonchi   CV:   RRR, 3/6 systolic murmur RSB, no diastolic murmur  Abdomen:  soft, non-tender, no masses   Extremities:  warm, well-perfused, pulses palpable at ankles, no lower extremity edema  Rectal/GU  Deferred  Neuro:   Grossly non-focal and symmetrical throughout  Skin:   Clean and dry, no rashes, no breakdown  Diagnostic Tests:    ECHOCARDIOGRAM REPORT         Patient Name:   Amy Terry Date of Exam: 01/18/2022  Medical Rec #:  [id number]          Height:       62.0 in  Accession #:    [id number]         Weight:       139.8 lb  Date of Birth:  March 19, 1971          BSA:          1.642 m  Patient Age:    66 years           BP:           100/60 mmHg  Patient Gender: F                  HR:           67 bpm.  Exam Location:  [address]   Procedure: 2D Echo, Cardiac Doppler and Color Doppler   Indications:    Q23.1 Bicuspid aortic valve     History:  Patient has prior history of Echocardiogram examinations,  most                  recent 07/15/2019. Bicuspid aortic valve;  Signs/Symptoms:AS.     Sonographer:    Coralyn Helling RDCS  Referring Phys: [place]     1. Left ventricular ejection fraction, by estimation, is 55 to 60%. The  left ventricle has normal function. The left ventricle has no regional  wall motion abnormalities. Left ventricular diastolic parameters were  normal.   2. Right ventricular systolic function is normal. The right ventricular  size is normal. There is normal pulmonary artery systolic pressure. The  estimated right ventricular systolic pressure is 33.8 mmHg.   3. The mitral valve is normal in structure. Trivial mitral valve  regurgitation. No evidence of mitral stenosis.   4. Tricuspid valve  regurgitation is mild to moderate.   5. The aortic valve is abnormal. Unable to determine aortic valve  morphology due to image quality on this study, however previously  described as bicuspid. Review of prior studies suggests calcified raphe  and fusion of left and non-coronary cusps. Aortic   valve regurgitation is trivial. Severe aortic valve stenosis. Aortic  valve mean gradient measures 40.0 mmHg. Aortic valve Vmax measures 3.97  m/s. Valve area 0.51 cm2 but may be underestimated due to low LVOT TVI.   6. The inferior vena cava is normal in size with greater than 50%  respiratory variability, suggesting right atrial pressure of 3 mmHg.   Comparison(s): A prior study was performed on 07/15/2019. Changes from  prior study are noted. BAV AS has progressed to severe stenosis.   FINDINGS   Left Ventricle: Left ventricular ejection fraction, by estimation, is 55  to 60%. The left ventricle has normal function. The left ventricle has no  regional wall motion abnormalities. The left ventricular internal cavity  size was normal in size. There is   no left ventricular hypertrophy. Left ventricular diastolic parameters  were normal.   Right Ventricle: The right ventricular size is normal. No increase in  right ventricular wall thickness. Right ventricular systolic function is  normal. There is normal pulmonary artery systolic pressure. The tricuspid  regurgitant velocity is 2.39 m/s, and   with an assumed right atrial pressure of 3 mmHg, the estimated right  ventricular systolic pressure is 25.0 mmHg.   Left Atrium: Left atrial size was normal in size.   Right Atrium: Right atrial size was normal in size.   Pericardium: There is no evidence of pericardial effusion.   Mitral Valve: The mitral valve is normal in structure. Trivial mitral  valve regurgitation. No evidence of mitral valve stenosis.   Tricuspid Valve: The tricuspid valve is normal in structure. Tricuspid  valve  regurgitation is mild to moderate. No evidence of tricuspid  stenosis.   Aortic Valve: The aortic valve is abnormal. Aortic valve regurgitation is  trivial. Severe aortic stenosis is present. Aortic valve mean gradient  measures 40.0 mmHg. Aortic valve peak gradient measures 63.0 mmHg. Aortic  valve area, by VTI measures 0.51  cm.   Pulmonic Valve: The pulmonic valve was normal in structure. Pulmonic valve  regurgitation is not visualized. No evidence of pulmonic stenosis.   Aorta: The aortic root is normal in size and structure.   Venous: The inferior vena cava is normal in size with greater than 50%  respiratory variability, suggesting right atrial pressure of 3 mmHg.   IAS/Shunts: No atrial  level shunt detected by color flow Doppler.      LEFT VENTRICLE  PLAX 2D  LVIDd:         4.30 cm   Diastology  LVIDs:         2.90 cm   LV e' medial:    9.14 cm/s  LV PW:         1.20 cm   LV E/e' medial:  8.4  LV IVS:        1.00 cm   LV e' lateral:   13.80 cm/s  LVOT diam:     2.00 cm   LV E/e' lateral: 5.6  LV SV:         48  LV SV Index:   29  LVOT Area:     3.14 cm      RIGHT VENTRICLE             IVC  RV S prime:     11.20 cm/s  IVC diam: 1.40 cm  TAPSE (M-mode): 1.9 cm  RVSP:           25.8 mmHg   LEFT ATRIUM             Index        RIGHT ATRIUM           Index  LA diam:        2.50 cm 1.52 cm/m   RA Pressure: 3.00 mmHg  LA Vol (A2C):   36.0 ml 21.93 ml/m  RA Area:     10.90 cm  LA Vol (A4C):   30.5 ml 18.58 ml/m  RA Volume:   22.10 ml  13.46 ml/m  LA Biplane Vol: 35.7 ml 21.74 ml/m   AORTIC VALVE  AV Area (Vmax):    0.58 cm  AV Area (Vmean):   0.55 cm  AV Area (VTI):     0.51 cm  AV Vmax:           397.00 cm/s  AV Vmean:          294.000 cm/s  AV VTI:            0.943 m  AV Peak Grad:      63.0 mmHg  AV Mean Grad:      40.0 mmHg  LVOT Vmax:         73.40 cm/s  LVOT Vmean:        51.900 cm/s  LVOT VTI:          0.153 m  LVOT/AV VTI ratio: 0.16     AORTA   Ao Root diam: 2.80 cm  Ao Asc diam:  3.30 cm   MITRAL VALVE               TRICUSPID VALVE  MV Area (PHT): 3.40 cm    TR Peak grad:   22.8 mmHg  MV Decel Time: 223 msec    TR Vmax:        239.00 cm/s  MV E velocity: 77.00 cm/s  Estimated RAP:  3.00 mmHg  MV A velocity: 73.90 cm/s  RVSP:           25.8 mmHg  MV E/A ratio:  1.04                             SHUNTS  Systemic VTI:  0.15 m                             Systemic Diam: 2.00 cm   Cherlynn Kaiser MD  Electronically signed by Cherlynn Kaiser MD  Signature Date/Time: 01/18/2022/12:27:42 PM     Physicians  Panel Physicians Referring Physician Case Authorizing Physician  Early Osmond, MD (Primary)     Procedures  RIGHT HEART CATH AND CORONARY ANGIOGRAPHY   Conclusion  1.  Normal right dominant circulation 2.  Cardiac output of 6.3 L/min and cardiac index of 3.8 L/min/m with relatively low filling pressures.   Recommendation: Cardiothoracic surgical consultation for surgical aortic valve replacement.   Indications  Aortic stenosis due to bicuspid aortic valve [Q23.0, Q23.1 (ICD-10-CM)]   Clinical Presentation  CHF/Shock Congestive heart failure not present. No shock present.     Procedural Details  Technical Details The patient is a 51 year old female with a history of severe symptomatic bicuspid aortic stenosis and iron deficiency anemia was evaluated in the outpatient setting due to increasing dyspnea.  She was referred for right heart catheterization and coronary angiography study for preoperative assessment.  After obtaining consent the patient was brought to the cardiac catheterization laboratory prepped draped sterile fashion ultrasound was used to gain access to the right radial artery and a 6 French glide sheath placed there.  '5mg'$  of verapamil and 5000 units of heparin were administered through the sheath.  A previously placed antecubital IV was exchanged for a 5 French femoral  glide sheath.  Right heart catheterization and coronary angiography study was then performed.  At the conclusion of the procedure manual pressure was applied to the antecubital site and a TR band was placed on the radial site.  Estimated blood loss <50 mL.   During this procedure medications were administered to achieve and maintain moderate conscious sedation while the patient's heart rate, blood pressure, and oxygen saturation were continuously monitored and I was present face-to-face 100% of this time.     Medications (Filter: Administrations occurring from 0719 to 0831 on 01/31/22) Heparin (Porcine) in NaCl 1000-0.9 UT/500ML-% SOLN (mL) Total volume:  1,000 mL  Date/Time Rate/Dose/Volume Action   01/31/22 0729 500 mL Given   0729 500 mL Given    midazolam (VERSED) injection (mg) Total dose:  1 mg  Date/Time Rate/Dose/Volume Action   01/31/22 0741 1 mg Given    lidocaine (PF) (XYLOCAINE) 1 % injection (mL) Total volume:  4 mL  Date/Time Rate/Dose/Volume Action   01/31/22 0746 2 mL Given   0748 2 mL Given    Radial Cocktail/Verapamil only Total dose:  Cannot be calculated*  *Administration dose not documented Date/Time Rate/Dose/Volume Action   01/31/22 0748  Given    heparin sodium (porcine) injection (Units) Total dose:  5,000 Units  Date/Time Rate/Dose/Volume Action   01/31/22 0749 5,000 Units Given    iohexol (OMNIPAQUE) 350 MG/ML injection (mL) Total volume:  30 mL  Date/Time Rate/Dose/Volume Action   01/31/22 0831 30 mL Given    Sedation Time  Sedation Time Physician-1: 42 minutes 24 seconds Contrast  Medication Name Total Dose  iohexol (OMNIPAQUE) 350 MG/ML injection 30 mL   Radiation/Fluoro  Fluoro time: 4.5 (min) DAP: 4 (Gycm2) Cumulative Air Kerma: 74 (mGy) Complications  Complications documented before study signed (01/31/2022  8:11 AM)   No complications were associated with this study.  Documented by Garnette Scheuermann, RT - 01/31/2022   8:27 AM  Coronary Findings  Diagnostic Dominance: Right No diagnostic findings have been documented. Intervention  No interventions have been documented. Coronary Diagrams  Diagnostic Dominance: Right  Intervention  Implants     No implant documentation for this case.   Syngo Images   Show images for CARDIAC CATHETERIZATION Images on Long Term Storage   Show images for Polimeni, CIRE DEYARMIN to Procedure Log  Procedure Log    Hemo Data  Flowsheet Row Most Recent Value  Fick Cardiac Output 6.31 L/min  Fick Cardiac Output Index 3.88 (L/min)/BSA  RA A Wave 6 mmHg  RA V Wave 3 mmHg  RA Mean 1 mmHg  RV Systolic Pressure 20 mmHg  RV Diastolic Pressure -6 mmHg  RV EDP 3 mmHg  PA Systolic Pressure 20 mmHg  PA Diastolic Pressure 5 mmHg  PA Mean 12 mmHg  PW A Wave 11 mmHg  PW V Wave 9 mmHg  PW Mean 6 mmHg  AO Systolic Pressure 92 mmHg  AO Diastolic Pressure 52 mmHg  AO Mean 69 mmHg  QP/QS 1.05  TPVR Index 2.94 HRUI  TSVR Index 16.23 HRUI  PVR SVR Ratio 0.09  TPVR/TSVR Ratio 0.18     Impression:  This 51 year old woman has stage D, severe, symptomatic aortic stenosis with New York Heart Association class II symptoms of exertional fatigue and shortness of breath as well as exertional chest tightness consistent with chronic diastolic congestive heart failure.  Her echocardiogram shows an indeterminate number of of aortic valve leaflets with a mean gradient across the aortic valve that has increased to 40 mmHg and a valve area of 0.51 cm consistent with severe aortic stenosis.  Her left ventricular systolic function is normal.  Previous gated cardiac CTA in 2019 had shown a bicuspid aortic valve.  She has some dilation of the ascending aorta on prior CTA and MRA studies measuring 3.0 cm in 2008 and 3.6 cm in 2019.  Her descending thoracic aorta in 2019 was 1.9 cm.  I agree that aortic valve replacement is indicated in this patient for relief of her symptoms and to  prevent progressive left ventricular dysfunction.  She will require a CTA of the chest to reevaluate her aorta for further enlargement and to decide if it needs to be replaced.  I discussed the alternatives of mechanical and bioprosthetic valves with her.  She does have a history of iron deficiency anemia and it is felt that it may be related to her aortic stenosis (Heyde's syndrome with AVM's). I think that she would be at high risk for bleeding with a mechanical valve and the need for chronic lifelong anticoagulation with Coumadin. It is possible that her anemia may improve with AVR but there is no way to be sure. It is probably best to use a bioprosthetic valve in her case. I reviewed the echo and cath studies with her and answered her questions. We will set up a CTA of the chest. She will think about the timing of surgery. She has a trip home to the [place] in early July and I told her that I did not think traveling that distance with severe AS is a good idea.  Plan:  She will have a CTA of the chest scheduled. I will call her with the results. She will let us know when she wants to proceed with surgery.  I spent 60 minutes performing this consultation and > 50% of this time was spent face to face counseling and coordinating the care of this patient's  severe bicuspid aortic valve stenosis.    [name], MD 02/04/2022

## 2022-02-07 ENCOUNTER — Ambulatory Visit (HOSPITAL_COMMUNITY)
Admission: RE | Admit: 2022-02-07 | Discharge: 2022-02-07 | Disposition: A | Discharge status: Home / Self Care | Payer: 59 | Source: Ambulatory Visit | Attending: Surgery | Admitting: Surgery

## 2022-02-07 DIAGNOSIS — Q231 Congenital insufficiency of aortic valve: Secondary | ICD-10-CM | POA: Diagnosis not present

## 2022-02-07 DIAGNOSIS — J189 Pneumonia, unspecified organism: Secondary | ICD-10-CM | POA: Diagnosis not present

## 2022-02-07 DIAGNOSIS — E041 Nontoxic single thyroid nodule: Secondary | ICD-10-CM | POA: Diagnosis not present

## 2022-02-07 DIAGNOSIS — Q230 Congenital stenosis of aortic valve: Secondary | ICD-10-CM | POA: Diagnosis not present

## 2022-02-07 DIAGNOSIS — J9811 Atelectasis: Secondary | ICD-10-CM | POA: Diagnosis not present

## 2022-02-07 MED ORDER — IOHEXOL 350 MG/ML SOLN
100.0000 mL | Freq: Once | INTRAVENOUS | Status: AC | PRN
  Administered 2022-02-07: 100 mL via INTRAVENOUS

## 2022-02-08 ENCOUNTER — Other Ambulatory Visit (HOSPITAL_BASED_OUTPATIENT_CLINIC_OR_DEPARTMENT_OTHER): Payer: Self-pay

## 2022-02-08 MED ORDER — DOXYCYCLINE HYCLATE 100 MG PO CAPS
100.0000 mg | ORAL_CAPSULE | Freq: Two times a day (BID) | ORAL | 0 refills | Status: DC
  Filled 2022-02-08: qty 20, 10d supply, fill #0

## 2022-02-22 ENCOUNTER — Encounter: Payer: 59 | Admitting: Thoracic Surgery (Cardiothoracic Vascular Surgery)

## 2022-04-10 ENCOUNTER — Other Ambulatory Visit: Payer: Self-pay | Admitting: Surgery

## 2022-04-10 ENCOUNTER — Ambulatory Visit: Payer: 59 | Admitting: Surgery

## 2022-04-10 ENCOUNTER — Ambulatory Visit: Payer: 59 | Admitting: Cardiovascular Disease

## 2022-04-10 ENCOUNTER — Encounter: Payer: Self-pay | Admitting: Surgery

## 2022-04-10 ENCOUNTER — Encounter: Payer: Self-pay | Admitting: *Deleted

## 2022-04-10 VITALS — BP 116/77 | HR 82 | Resp 20 | Ht 62.0 in | Wt 138.0 lb

## 2022-04-10 DIAGNOSIS — Q230 Congenital stenosis of aortic valve: Secondary | ICD-10-CM

## 2022-04-10 DIAGNOSIS — I350 Nonrheumatic aortic (valve) stenosis: Secondary | ICD-10-CM | POA: Insufficient documentation

## 2022-04-10 DIAGNOSIS — Q231 Congenital insufficiency of aortic valve: Secondary | ICD-10-CM

## 2022-04-10 DIAGNOSIS — E041 Nontoxic single thyroid nodule: Secondary | ICD-10-CM

## 2022-04-10 DIAGNOSIS — R011 Cardiac murmur, unspecified: Secondary | ICD-10-CM | POA: Insufficient documentation

## 2022-04-10 NOTE — Progress Notes (Signed)
    HPI: ***  Current Outpatient Medications  Medication Sig Dispense Refill   acetaminophen (TYLENOL) 500 MG tablet Take 1,000 mg by mouth every 6 (six) hours as needed for headache or moderate pain.     ferrous sulfate 325 (65 FE) MG tablet Take 325 mg by mouth daily with breakfast.     fluticasone (FLONASE) 50 MCG/ACT nasal spray Place 1 spray into both nostrils daily as needed for allergies or rhinitis.     melatonin 5 MG TABS Take 5 mg by mouth at bedtime as needed (sleep).     doxycycline (VIBRAMYCIN) 100 MG capsule Take 1 capsule (100 mg total) by mouth 2 (two) times daily. (Patient not taking: Reported on 04/10/2022) 20 capsule 0   Phendimetrazine Tartrate 35 MG TABS Take 35 mg by mouth 4 (four) times a week. (Patient not taking: Reported on 04/10/2022)     Current Facility-Administered Medications  Medication Dose Route Frequency Provider Last Rate Last Admin   sodium chloride flush (NS) 0.9 % injection 3 mL  3 mL Intravenous Q12H Josue Hector, MD         Physical Exam: ***  Diagnostic Tests: ***  Impression: ***  Plan: ***   [name], MD [facility] [phone]

## 2022-04-12 ENCOUNTER — Ambulatory Visit (HOSPITAL_COMMUNITY)
Admission: RE | Admit: 2022-04-12 | Discharge: 2022-04-12 | Disposition: A | Discharge status: Home / Self Care | Payer: 59 | Source: Ambulatory Visit | Attending: Surgery | Admitting: Surgery

## 2022-04-12 DIAGNOSIS — E041 Nontoxic single thyroid nodule: Secondary | ICD-10-CM | POA: Diagnosis not present

## 2022-04-12 DIAGNOSIS — E042 Nontoxic multinodular goiter: Secondary | ICD-10-CM | POA: Diagnosis not present

## 2022-04-17 ENCOUNTER — Other Ambulatory Visit: Payer: Self-pay | Admitting: *Deleted

## 2022-04-17 ENCOUNTER — Other Ambulatory Visit: Payer: Self-pay | Admitting: Surgery

## 2022-04-17 ENCOUNTER — Encounter: Payer: Self-pay | Admitting: *Deleted

## 2022-04-17 DIAGNOSIS — E041 Nontoxic single thyroid nodule: Secondary | ICD-10-CM

## 2022-04-17 DIAGNOSIS — Q230 Congenital stenosis of aortic valve: Secondary | ICD-10-CM

## 2022-04-18 ENCOUNTER — Encounter: Payer: Self-pay | Admitting: *Deleted

## 2022-04-18 ENCOUNTER — Telehealth: Payer: Self-pay | Admitting: Cardiovascular Disease

## 2022-04-18 DIAGNOSIS — Z0279 Encounter for issue of other medical certificate: Secondary | ICD-10-CM

## 2022-04-18 NOTE — Telephone Encounter (Signed)
Fmla documents received, Payment received, forms will be in providers box  Thank you

## 2022-04-18 NOTE — Telephone Encounter (Signed)
Patient stated she will be sending over FMLA paperwork to be completed from [employer].

## 2022-04-18 NOTE — Telephone Encounter (Signed)
Did not find paper work. Requested patient have paper work faxed again.

## 2022-04-19 ENCOUNTER — Telehealth: Payer: Self-pay

## 2022-04-19 NOTE — Telephone Encounter (Signed)
FMLA form was completed and faxed to [employer] @ [phone]. Beginning LOA 05/21/22 through 08/19/22. Dr [name] put pt on light duty at work starting 04/10/22 ( no heavy lifting over 10 lbs/no CPR) letter is in epic chart.

## 2022-04-22 NOTE — Progress Notes (Unsigned)
Office Visit    Patient Name: Amy Terry Date of Encounter: 04/24/2022  Primary Care Provider:  [name], MD Primary Cardiologist:  Amy Rouge, MD Primary Electrophysiologist: None  Chief Complaint    Amy Terry is a 51 y.o. female with PMH of bicuspid AV with scheduled replacement 05/2022, aortic stenosis, palpitations who presents today for complaint of shortness of breath.  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   Sinusitis    UTI (urinary tract infection)    Past Surgical History:  Procedure Laterality Date   CESAREAN SECTION     CESAREAN SECTION     RIGHT HEART CATH AND CORONARY ANGIOGRAPHY N/A 01/31/2022   Procedure: RIGHT HEART CATH AND CORONARY ANGIOGRAPHY;  Surgeon: Amy Osmond, MD;  Location: [facility];  Service: Cardiovascular;  Laterality: N/A;   TUBAL LIGATION     TUBAL LIGATION  2007    Allergies  Allergies  Allergen Reactions   Other Rash    Tegaderm    History of Present Illness    Amy Terry has a PMH of is a 51 year old female with the above mention past medical history who presents today for follow-up of shortness of breath.  Amy Terry was initially seen by Dr. Johnsie Terry in 2016 for complaint of palpitations.  She reported flip-flopping sensation in chest with increased dyspnea and fatigue with activity.  2D echo was completed showing EF of 55-60%, mean gradient 15 mmHg peak 26 mmgh Bicuspid valve likely aortic root mildly dilated but measurement indicates 34 mm.  Event monitor was also worn that showed no arrhythmias and predominant sinus rhythm.  She later presented 2019 with similar complaints and 2D echo was repeated and showed similar findings with able moderate AS.  Cardiac CTA was obtained and showed aortic root of 3.6 cm and a calcium score 0.    She was seen in June 2023 to  reestablish care with continued exertional dyspnea and chest pain.  She has a history of iron deficiency anemia and CBC from "GI bleeding and acquired von Willebrand syndrome.  CBC completed and was normal. She underwent RHC and was referred to CVTS for valve replacement work-up.  Catheter revealed cardiac output of 6.3 L/min with low filling pressures and normal right dominant circulation.  She was last seen by Amy Bame, NP following her RHC and consult with Dr. Cyndia Terry.  She reported short episodes of palpitations that occur randomly over few seconds.   Amy Terry presents alone today for follow-up. Since last being seen in the office patient reports she is having some chest discomfort and shortness of breath that occurs for less than a minute and is also associated with flutters.  She states that they flutters occur randomly and are not limiting to her activity.  The symptoms are similar to her previous complaint during her appointment in June.  We reviewed her documentation for FMLA coverage and also her upcoming scheduled valve replacement date.  Patient denies chest dyspnea, PND, orthopnea, nausea, vomiting, dizziness, syncope, edema, weight gain, or Amy satiety.  Home Medications    Current Outpatient Medications  Medication Sig Dispense Refill   acetaminophen (TYLENOL) 500 MG tablet Take 1,000 mg by mouth every 6 (six) hours as needed for headache or moderate pain.  fluticasone (FLONASE) 50 MCG/ACT nasal spray Place 1 spray into both nostrils daily as needed for allergies or rhinitis.     melatonin 5 MG TABS Take 5 mg by mouth at bedtime as needed (sleep).     ferrous sulfate 325 (65 FE) MG tablet Take 325 mg by mouth daily with breakfast. (Patient not taking: Reported on 04/24/2022)     No current facility-administered medications for this visit.     Review of Systems  Please see the history of present illness.    (+) Shortness of breath (+) Palpitations and chest  discomfort  All other systems reviewed and are otherwise negative except as noted above.  Physical Exam    Wt Readings from Last 3 Encounters:  04/24/22 142 lb (64.4 kg)  04/10/22 138 lb (62.6 kg)  02/05/22 138 lb 12.8 oz (63 kg)   VS: Vitals:   04/24/22 0908  BP: 108/68  Pulse: 72  SpO2: 99%  ,Body mass index is 25.97 kg/m.  Constitutional:      Appearance: Healthy appearance. Not in distress.  Neck:     Vascular: JVD normal.  Pulmonary:     Effort: Pulmonary effort is normal.     Breath sounds: No wheezing. No rales. Diminished in the bases Cardiovascular:     Normal rate. Regular rhythm. Normal S1. Normal S2.      Murmurs:  3/6 Systolic murmur present at RUSB Edema:    Peripheral edema absent.  Abdominal:     Palpations: Abdomen is soft non tender. There is no hepatomegaly.  Skin:    General: Skin is warm and dry.  Neurological:     General: No focal deficit present.     Mental Status: Alert and oriented to person, place and time.     Cranial Nerves: Cranial nerves are intact.  EKG/LABS/Other Studies Reviewed    ECG personally reviewed by me today -none completed today    Lab Results  Component Value Date   WBC 4.3 01/25/2022   HGB 11.6 (L) 01/31/2022   HCT 34.0 (L) 01/31/2022   MCV 76 (L) 01/25/2022   PLT 300 01/25/2022   Lab Results  Component Value Date   CREATININE 0.60 01/25/2022   BUN 10 01/25/2022   NA 140 01/31/2022   K 3.5 01/31/2022   CL 102 01/25/2022   CO2 27 01/25/2022   Lab Results  Component Value Date   ALT 12 12/31/2021   AST 15 12/31/2021   ALKPHOS 43 12/31/2021   BILITOT 0.4 12/31/2021   Lab Results  Component Value Date   CHOL 170 12/24/2021   HDL 75.10 12/24/2021   LDLCALC 85 12/24/2021   TRIG 52.0 12/24/2021   CHOLHDL 2 12/24/2021    Lab Results  Component Value Date   HGBA1C 5.4 12/24/2021    Assessment & Plan    1.  Severe aortic stenosis: -2D echo was completed showing EF of 55-60%, mean gradient 15 mmHg  peak 26  -R/LHC with cardiac output of 6.3 L/min and cardiac index of 3.8 L/min/m -Today patient reports shortness of breath and fatigue that has been ongoing and brought on with activity.  She also endorses palpitations and fleeting chest pain that resolved spontaneously. -Patient scheduled for AV replacement on 05/21/2022 -Patient is still hesitant regarding starting medications and was advised to limit activity at home to prevent exertional symptoms  2.  Palpitations: -Patient reported short episodes of palpitations during recent visit in August -Today patient reports palpitations however also states that they  are short-lived and are not bothersome. -She is in agreement with conservative management with no medications at this time. -She was advised to reach out to our office if palpitations become more bothersome and increased in intensity  3.  Dyspnea on exertion: -Today patient reports continued shortness of breath with heavy exertion that is resolved with rest  Disposition: Follow-up with Amy Rouge, MD or APP as scheduled    Medication Adjustments/Labs and Tests Ordered: Current medicines are reviewed at length with the patient today.  Concerns regarding medicines are outlined above.   Signed, Mable Fill, Marissa Nestle, NP 04/24/2022, 11:11 AM [place]

## 2022-04-24 ENCOUNTER — Ambulatory Visit: Payer: 59 | Attending: Nurse Practitioner | Admitting: Nurse Practitioner

## 2022-04-24 ENCOUNTER — Encounter: Payer: Self-pay | Admitting: Nurse Practitioner

## 2022-04-24 VITALS — BP 108/68 | HR 72 | Ht 62.0 in | Wt 142.0 lb

## 2022-04-24 DIAGNOSIS — I350 Nonrheumatic aortic (valve) stenosis: Secondary | ICD-10-CM

## 2022-04-24 DIAGNOSIS — Q231 Congenital insufficiency of aortic valve: Secondary | ICD-10-CM | POA: Diagnosis not present

## 2022-04-24 DIAGNOSIS — R0609 Other forms of dyspnea: Secondary | ICD-10-CM

## 2022-04-24 DIAGNOSIS — R002 Palpitations: Secondary | ICD-10-CM | POA: Diagnosis not present

## 2022-04-24 NOTE — Patient Instructions (Addendum)
Medication Instructions:  Your physician recommends that you continue on your current medications as directed. Please refer to the Current Medication list given to you today. *If you need a refill on your cardiac medications before your next appointment, please call your pharmacy*   Lab Work: NONE ORDERED   Testing/Procedures: NONE ORDERED   Follow-Up: At [facility], you and your health needs are our priority.  As part of our continuing mission to provide you with exceptional heart care, we have created designated Provider Care Teams.  These Care Teams include your primary Cardiologist (physician) and Advanced Practice Providers (APPs -  Physician Assistants and Nurse Practitioners) who all work together to provide you with the care you need, when you need it.  We recommend signing up for the patient portal called "MyChart".  Sign up information is provided on this After Visit Summary.  MyChart is used to connect with patients for Virtual Visits (Telemedicine).  Patients are able to view lab/test results, encounter notes, upcoming appointments, etc.  Non-urgent messages can be sent to your provider as well.   To learn more about what you can do with MyChart, go to [email or web address].    Your next appointment:   TO BE DETERMINED AFTER PROCEDURE ON 05/21/2022   The format for your next appointment:   In Person  Provider:   [name], MD     Other Instructions FLMA FORMS WILL BE COMPLETED  Important Information About Sugar

## 2022-04-25 ENCOUNTER — Telehealth: Payer: Self-pay | Admitting: Cardiovascular Disease

## 2022-04-25 NOTE — Telephone Encounter (Signed)
Forms completed and returned to front office staff for completion of documents.

## 2022-04-29 NOTE — Progress Notes (Signed)
Cardiology Office Note   Date:  05/13/2022   ID:  Amy Terry, DOB 1971-07-25, MRN [id number]   PCP:  [name], MD  Cardiologist:  Dr Johnsie Cancel    No chief complaint on file.     History of Present Illness:  51 y.o. with history of bicuspid AV, severe AS and GI bleeding    07/06/18 Cardiac CTA: Aortic root 3.6 cm Calcium score 0 normal right dominant coronary arteries    Long history of iron deficiency anemia with ferritin only 2 Has not been responsive to oral iron Seen by hematology 12/31/21 set up for iv iron Hb 8.6 and has not had recent EGD/Colon  She is symptomatic with exertional dyspnea and chest pain   TTE 01/18/22 EF normal 55-60% bicuspid AV with severe AS mean gradient 40 peak 63 mmHg DVI 0.16 and AVA 0.51 cm2 Cath:  01/31/22 no CAD   Long discussion about need for cath and referral to CVTS for symptomatic Severe AS Risks of cath including stroke, contrast reaction, bleeding intubation and emergency surgery discussed Willing to proceed  She is still working as a Marine scientist on [employer]  Has a 60 and 51 yo at home   Seen by [facility] 02/04/22 needed chest CTA and to schedule surgery CTA 6/29 with lingular pneumonia and aortic root 3.8 cm Surgery now scheduled for 05/21/22   Seems to be ready for surgery        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     fluticasone (FLONASE) 50 MCG/ACT nasal spray Place 1 spray into both nostrils daily as needed for allergies or rhinitis.     No current facility-administered medications for this visit.    Allergies:   Ivp dye [iodinated contrast media] and Other    Social History:  The patient  reports that she has never smoked. She has never used smokeless tobacco. She reports that she does not drink alcohol and does not use drugs.   Family History:  The patient's family history includes Anemia in her sister; Asthma in her sister; Breast cancer in her sister; Emphysema in her mother; Hyperlipidemia in her brother; Hypertension in her father and mother; Thyroid disease in her paternal aunt, sister, sister, and sister.    ROS:  General:no colds or fevers, no weight changes Skin:no rashes or ulcers HEENT:no blurred vision, no congestion CV:see HPI PUL:see HPI GI:no diarrhea constipation or melena, no indigestion GU:no hematuria, no dysuria MS:no joint pain, no claudication Neuro:no syncope, no lightheadedness Endo:no diabetes, no thyroid disease  Wt Readings from Last 3 Encounters:  05/13/22 140 lb 6.4 oz (63.7 kg)  04/24/22 142 lb (64.4  kg)  04/10/22 138 lb (62.6 kg)     PHYSICAL EXAM: VS:  BP 94/62   Pulse 71   Ht '5\' 2"'$  (1.575 m)   Wt 140 lb 6.4 oz (63.7 kg)   SpO2 98%   BMI 25.68 kg/m  , BMI Body mass index is 25.68 kg/m.  Affect appropriate Healthy:  appears stated age 65: normal Neck supple with no adenopathy JVP normal no bruits no thyromegaly Lungs clear with no wheezing and good diaphragmatic motion Heart:  S1/S2 AS  murmur, no rub, gallop or click PMI normal Abdomen: benighn, BS positve, no tenderness, no AAA no bruit.  No HSM or HJR Distal pulses intact with no bruits No edema Neuro non-focal Skin warm and dry No muscular weakness     EKG:   SR no changes from previous.    Recent  Labs: 12/24/2021: TSH 0.97 12/31/2021: ALT 12 01/25/2022: BUN 10; Creatinine, Ser 0.60; Platelets 300 01/31/2022: Hemoglobin 11.6; Potassium 3.5; Sodium 140    Lipid Panel    Component Value Date/Time   CHOL 170 12/24/2021 1055   TRIG 52.0 12/24/2021 1055   HDL 75.10 12/24/2021 1055   CHOLHDL 2 12/24/2021 1055   VLDL 10.4 12/24/2021 1055   LDLCALC 85 12/24/2021 1055       Other studies Reviewed:  Echo 06/05/18  Echo 04/20/22  Cardiac CTA 07/06/18   ASSESSMENT AND PLAN:  1.  Bicuspid Ao Valve : severe AS pending surgery normal aortic root no CAD at cath Surgical date 05/21/22   2.  palpitations  Benign no arrhythmia on monitor   3. 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 Leg Pain:  Likely some venous engorgement on right with previous varicosities pulses good and ABI's normal 07/12/19  Pre CABG dopplers pending     F/U with CVTS schedule surgery   Signed, [name], MD  05/13/2022 8:40 AM    [facility] [address], [place], [place], [place] Phone: [phone]; Fax: [phone]

## 2022-05-13 ENCOUNTER — Encounter: Payer: Self-pay | Admitting: Cardiovascular Disease

## 2022-05-13 ENCOUNTER — Ambulatory Visit: Payer: 59 | Attending: Cardiovascular Disease | Admitting: Cardiovascular Disease

## 2022-05-13 VITALS — BP 94/62 | HR 71 | Ht 62.0 in | Wt 140.4 lb

## 2022-05-13 DIAGNOSIS — Q231 Congenital insufficiency of aortic valve: Secondary | ICD-10-CM

## 2022-05-13 DIAGNOSIS — I350 Nonrheumatic aortic (valve) stenosis: Secondary | ICD-10-CM | POA: Diagnosis not present

## 2022-05-13 NOTE — Patient Instructions (Signed)
Medication Instructions:  Your physician recommends that you continue on your current medications as directed. Please refer to the Current Medication list given to you today.  *If you need a refill on your cardiac medications before your next appointment, please call your pharmacy*  Lab Work: If you have labs (blood work) drawn today and your tests are completely normal, you will receive your results only by: [place] (if you have MyChart) OR A paper copy in the mail If you have any lab test that is abnormal or we need to change your treatment, we will call you to review the results.  Testing/Procedures: None ordered today.  Follow-Up: At [facility], you and your health needs are our priority.  As part of our continuing mission to provide you with exceptional heart care, we have created designated Provider Care Teams.  These Care Teams include your primary Cardiologist (physician) and Advanced Practice Providers (APPs -  Physician Assistants and Nurse Practitioners) who all work together to provide you with the care you need, when you need it.  We recommend signing up for the patient portal called "MyChart".  Sign up information is provided on this After Visit Summary.  MyChart is used to connect with patients for Virtual Visits (Telemedicine).  Patients are able to view lab/test results, encounter notes, upcoming appointments, etc.  Non-urgent messages can be sent to your provider as well.   To learn more about what you can do with MyChart, go to [email or web address].    Your next appointment:   2 month(s)  The format for your next appointment:   In Person  Provider:   [name], MD     Important Information About Sugar

## 2022-05-16 NOTE — Pre-Procedure Instructions (Signed)
Surgical Instructions    Your procedure is scheduled on May 21, 2022.  Report to [facility] Main Entrance "A" at 5:30 A.M., then check in with the Admitting office.  Call this number if you have problems the morning of surgery:  [phone]   If you have any questions prior to your surgery date call [phone]: Open Monday-Friday 8am-4pm    Remember:  Do not eat or drink after midnight the night before your surgery      Take these medicines the morning of surgery with a sip of water AS NEEDED:  acetaminophen (TYLENOL)   fluticasone (FLONASE)    As of today, STOP taking any Aspirin (unless otherwise instructed by your surgeon) Aleve, Naproxen, Ibuprofen, Motrin, Advil, Goody's, BC's, all herbal medications, fish oil, and all vitamins.                     Do NOT Smoke (Tobacco/Vaping) for 24 hours prior to your procedure.  If you use a CPAP at night, you may bring your mask/headgear for your overnight stay.   Contacts, glasses, piercing's, hearing aid's, dentures or partials may not be worn into surgery, please bring cases for these belongings.    For patients admitted to the hospital, discharge time will be determined by your treatment team.   Patients discharged the day of surgery will not be allowed to drive home, and someone needs to stay with them for 24 hours.  SURGICAL WAITING ROOM VISITATION Patients having surgery or a procedure may have no more than 2 support people in the waiting area - these visitors may rotate.   Children under the age of 65 must have an adult with them who is not the patient. If the patient needs to stay at the hospital during part of their recovery, the visitor guidelines for inpatient rooms apply. Pre-op nurse will coordinate an appropriate time for 1 support person to accompany patient in pre-op.  This support person may not rotate.   Please refer to the [facility] website for the visitor guidelines for Inpatients (after your surgery is  over and you are in a regular room).    Special instructions:   [place]- Preparing For Surgery  Before surgery, you can play an important role. Because skin is not sterile, your skin needs to be as free of germs as possible. You can reduce the number of germs on your skin by washing with CHG (chlorahexidine gluconate) Soap before surgery.  CHG is an antiseptic cleaner which kills germs and bonds with the skin to continue killing germs even after washing.    Oral Hygiene is also important to reduce your risk of infection.  Remember - BRUSH YOUR TEETH THE MORNING OF SURGERY WITH YOUR REGULAR TOOTHPASTE  Please do not use if you have an allergy to CHG or antibacterial soaps. If your skin becomes reddened/irritated stop using the CHG.  Do not shave (including legs and underarms) for at least 48 hours prior to first CHG shower. It is OK to shave your face.  Please follow these instructions carefully.   Shower the NIGHT BEFORE SURGERY and the MORNING OF SURGERY  If you chose to wash your hair, wash your hair first as usual with your normal shampoo.  After you shampoo, rinse your hair and body thoroughly to remove the shampoo.  Use CHG Soap as you would any other liquid soap. You can apply CHG directly to the skin and wash gently with a scrungie or a clean washcloth.  Apply the CHG Soap to your body ONLY FROM THE NECK DOWN.  Do not use on open wounds or open sores. Avoid contact with your eyes, ears, mouth and genitals (private parts). Wash Face and genitals (private parts)  with your normal soap.   Wash thoroughly, paying special attention to the area where your surgery will be performed.  Thoroughly rinse your body with warm water from the neck down.  DO NOT shower/wash with your normal soap after using and rinsing off the CHG Soap.  Pat yourself dry with a CLEAN TOWEL.  Wear CLEAN PAJAMAS to bed the night before surgery  Place CLEAN SHEETS on your bed the night before your  surgery  DO NOT SLEEP WITH PETS.   Day of Surgery: Take a shower with CHG soap. Do not wear jewelry or makeup Do not wear lotions, powders, perfumes/colognes, or deodorant. Do not shave 48 hours prior to surgery.  Men may shave face and neck. Do not bring valuables to the hospital.  [facility] is not responsible for any belongings or valuables. Do not wear nail polish, gel polish, artificial nails, or any other type of covering on natural nails (fingers and toes) If you have artificial nails or gel coating that need to be removed by a nail salon, please have this removed prior to surgery. Artificial nails or gel coating may interfere with anesthesia's ability to adequately monitor your vital signs.  Wear Clean/Comfortable clothing the morning of surgery Remember to brush your teeth WITH YOUR REGULAR TOOTHPASTE.   Please read over the following fact sheets that you were given.    If you received a COVID test during your pre-op visit  it is requested that you wear a mask when out in public, stay away from anyone that may not be feeling well and notify your surgeon if you develop symptoms. If you have been in contact with anyone that has tested positive in the last 10 days please notify you surgeon.

## 2022-05-17 ENCOUNTER — Encounter (HOSPITAL_COMMUNITY): Payer: Self-pay

## 2022-05-17 ENCOUNTER — Ambulatory Visit (HOSPITAL_COMMUNITY)
Admission: RE | Admit: 2022-05-17 | Discharge: 2022-05-17 | Disposition: A | Discharge status: Home / Self Care | Payer: 59 | Source: Ambulatory Visit | Attending: Surgery | Admitting: Surgery

## 2022-05-17 ENCOUNTER — Encounter (HOSPITAL_COMMUNITY)
Admission: RE | Admit: 2022-05-17 | Discharge: 2022-05-17 | Disposition: A | Discharge status: Home / Self Care | Payer: 59 | Source: Ambulatory Visit | Attending: Surgery | Admitting: Surgery

## 2022-05-17 ENCOUNTER — Other Ambulatory Visit: Payer: Self-pay

## 2022-05-17 VITALS — BP 97/69 | HR 69 | Temp 97.9°F | Resp 17 | Ht 62.0 in | Wt 141.6 lb

## 2022-05-17 DIAGNOSIS — Z01818 Encounter for other preprocedural examination: Secondary | ICD-10-CM | POA: Insufficient documentation

## 2022-05-17 DIAGNOSIS — J9811 Atelectasis: Secondary | ICD-10-CM | POA: Diagnosis not present

## 2022-05-17 DIAGNOSIS — Q230 Congenital stenosis of aortic valve: Secondary | ICD-10-CM

## 2022-05-17 DIAGNOSIS — I350 Nonrheumatic aortic (valve) stenosis: Secondary | ICD-10-CM | POA: Diagnosis not present

## 2022-05-17 DIAGNOSIS — Q231 Congenital insufficiency of aortic valve: Secondary | ICD-10-CM | POA: Insufficient documentation

## 2022-05-17 DIAGNOSIS — Z1152 Encounter for screening for COVID-19: Secondary | ICD-10-CM | POA: Diagnosis not present

## 2022-05-17 HISTORY — DX: Headache, unspecified: R51.9

## 2022-05-17 HISTORY — DX: Other complications of anesthesia, initial encounter: T88.59XA

## 2022-05-17 HISTORY — DX: Personal history of other diseases of the respiratory system: Z87.09

## 2022-05-17 HISTORY — DX: Anemia, unspecified: D64.9

## 2022-05-17 HISTORY — DX: Other congenital deformities of skull, face and jaw: Q67.4

## 2022-05-17 HISTORY — DX: Hypoglycemia, unspecified: E16.2

## 2022-05-17 HISTORY — DX: Nontoxic single thyroid nodule: E04.1

## 2022-05-17 LAB — COMPREHENSIVE METABOLIC PANEL
ALT: 27 U/L (ref 0–44)
AST: 27 U/L (ref 15–41)
Albumin: 3.6 g/dL (ref 3.5–5.0)
Alkaline Phosphatase: 43 U/L (ref 38–126)
Anion gap: 12 (ref 5–15)
BUN: 11 mg/dL (ref 6–20)
CO2: 23 mmol/L (ref 22–32)
Calcium: 9.2 mg/dL (ref 8.9–10.3)
Chloride: 105 mmol/L (ref 98–111)
Creatinine, Ser: 0.61 mg/dL (ref 0.44–1.00)
GFR, Estimated: >60 mL/min (ref >60)
Glucose, Bld: 93 mg/dL (ref 70–99)
Potassium: 4 mmol/L (ref 3.5–5.1)
Sodium: 140 mmol/L (ref 135–145)
Total Bilirubin: 0.9 mg/dL (ref 0.3–1.2)
Total Protein: 6.6 g/dL (ref 6.5–8.1)

## 2022-05-17 LAB — BLOOD GAS, ARTERIAL
Acid-Base Excess: 1.2 mmol/L (ref 0.0–2.0)
Bicarbonate: 26.6 mmol/L (ref 20.0–28.0)
Drawn by: 58793
O2 Saturation: 99.3 %
Patient temperature: 37
pCO2 arterial: 44 mmHg (ref 32–48)
pH, Arterial: 7.39 (ref 7.35–7.45)
pO2, Arterial: 125 mmHg — ABNORMAL HIGH (ref 83–108)

## 2022-05-17 LAB — CBC
HCT: 37 % (ref 36.0–46.0)
Hemoglobin: 12.3 g/dL (ref 12.0–15.0)
MCH: 29.6 pg (ref 26.0–34.0)
MCHC: 33.2 g/dL (ref 30.0–36.0)
MCV: 88.9 fL (ref 80.0–100.0)
Platelets: 257 10*3/uL (ref 150–400)
RBC: 4.16 MIL/uL (ref 3.87–5.11)
RDW: 14 % (ref 11.5–15.5)
WBC: 4.9 10*3/uL (ref 4.0–10.5)
nRBC: 0 % (ref 0.0–0.2)

## 2022-05-17 LAB — URINALYSIS, ROUTINE W REFLEX MICROSCOPIC
Bilirubin Urine: NEGATIVE
Glucose, UA: NEGATIVE mg/dL
Hgb urine dipstick: NEGATIVE
Ketones, ur: NEGATIVE mg/dL
Leukocytes,Ua: NEGATIVE
Nitrite: NEGATIVE
Protein, ur: NEGATIVE mg/dL
Specific Gravity, Urine: 1.017 (ref 1.005–1.030)
pH: 5 (ref 5.0–8.0)

## 2022-05-17 LAB — SURGICAL PCR SCREEN
MRSA, PCR: NEGATIVE
Staphylococcus aureus: NEGATIVE

## 2022-05-17 LAB — PROTIME-INR
INR: 1.3 — ABNORMAL HIGH (ref 0.8–1.2)
Prothrombin Time: 15.6 seconds — ABNORMAL HIGH (ref 11.4–15.2)

## 2022-05-17 LAB — APTT: aPTT: 34 seconds (ref 24–36)

## 2022-05-17 LAB — HEMOGLOBIN A1C
Hgb A1c MFr Bld: 5.2 % (ref 4.8–5.6)
Mean Plasma Glucose: 102.54 mg/dL

## 2022-05-17 LAB — ABO/RH: ABO/RH(D): O POS

## 2022-05-17 NOTE — Progress Notes (Addendum)
PCP: Dr. Edilia Bo Cardiologist: Dr. Johnsie Cancel  EKG: 05-17-22 CXR:05-17-22 ECHO:01-18-22 Stress Test: Cardiac Cath:01-31-22  Covid Test: 05-17-22  Will need preg test DOS  Patient denies shortness of breath, fever, cough, and chest pain at PAT appointment.  Patient verbalized understanding of instructions provided today at the PAT appointment.  Patient asked to review instructions at home and day of surgery.   Addendum: Pt's BP 97/69, pulse 69 at today's PAT appt. Reports she normally has a "soft BP." [place] Crown with Anesthesia made aware.

## 2022-05-17 NOTE — Progress Notes (Signed)
Pre cabg has been completed.   Preliminary results in CV Proc.   Fontaine Kossman Myrtha Tonkovich 05/17/2022 11:11 AM

## 2022-05-18 LAB — SARS CORONAVIRUS 2 (TAT 6-24 HRS): SARS Coronavirus 2: NEGATIVE

## 2022-05-20 MED ORDER — HEPARIN 30,000 UNITS/1000 ML (OHS) CELLSAVER SOLUTION
Status: DC
  Filled 2022-05-20: qty 1000

## 2022-05-20 MED ORDER — NOREPINEPHRINE 4 MG/250ML-% IV SOLN
0.0000 ug/min | INTRAVENOUS | Status: DC
  Filled 2022-05-20: qty 250

## 2022-05-20 MED ORDER — MILRINONE LACTATE IN DEXTROSE 20-5 MG/100ML-% IV SOLN
0.3000 ug/kg/min | INTRAVENOUS | Status: DC
  Filled 2022-05-20: qty 100

## 2022-05-20 MED ORDER — TRANEXAMIC ACID 1000 MG/10ML IV SOLN
1.5000 mg/kg/h | INTRAVENOUS | Status: AC
  Administered 2022-05-21: 1.5 mg/kg/h via INTRAVENOUS
  Filled 2022-05-20: qty 25

## 2022-05-20 MED ORDER — TRANEXAMIC ACID (OHS) PUMP PRIME SOLUTION
2.0000 mg/kg | INTRAVENOUS | Status: DC
  Filled 2022-05-20: qty 1.28

## 2022-05-20 MED ORDER — TRANEXAMIC ACID (OHS) BOLUS VIA INFUSION
15.0000 mg/kg | INTRAVENOUS | Status: AC
  Administered 2022-05-21: 963 mg via INTRAVENOUS
  Filled 2022-05-20: qty 963

## 2022-05-20 MED ORDER — DEXMEDETOMIDINE HCL IN NACL 400 MCG/100ML IV SOLN
0.1000 ug/kg/h | INTRAVENOUS | Status: AC
  Administered 2022-05-21: .5 ug/kg/h via INTRAVENOUS
  Filled 2022-05-20: qty 100

## 2022-05-20 MED ORDER — CEFAZOLIN SODIUM-DEXTROSE 2-4 GM/100ML-% IV SOLN
2.0000 g | INTRAVENOUS | Status: AC
  Administered 2022-05-21: 2 g via INTRAVENOUS
  Filled 2022-05-20: qty 100

## 2022-05-20 MED ORDER — MANNITOL 20 % IV SOLN
INTRAVENOUS | Status: DC
  Filled 2022-05-20: qty 13

## 2022-05-20 MED ORDER — EPINEPHRINE HCL 5 MG/250ML IV SOLN IN NS
0.0000 ug/min | INTRAVENOUS | Status: DC
  Filled 2022-05-20: qty 250

## 2022-05-20 MED ORDER — PLASMA-LYTE A IV SOLN
INTRAVENOUS | Status: DC
  Filled 2022-05-20: qty 2.5

## 2022-05-20 MED ORDER — PHENYLEPHRINE HCL-NACL 20-0.9 MG/250ML-% IV SOLN
30.0000 ug/min | INTRAVENOUS | Status: AC
  Administered 2022-05-21: 25 ug/min via INTRAVENOUS
  Filled 2022-05-20: qty 250

## 2022-05-20 MED ORDER — INSULIN REGULAR(HUMAN) IN NACL 100-0.9 UT/100ML-% IV SOLN
INTRAVENOUS | Status: AC
  Administered 2022-05-21: .9 [IU]/h via INTRAVENOUS
  Filled 2022-05-20: qty 100

## 2022-05-20 MED ORDER — VANCOMYCIN HCL 1250 MG/250ML IV SOLN
1250.0000 mg | INTRAVENOUS | Status: AC
  Administered 2022-05-21: 1250 mg via INTRAVENOUS
  Filled 2022-05-20: qty 250

## 2022-05-20 MED ORDER — POTASSIUM CHLORIDE 2 MEQ/ML IV SOLN
80.0000 meq | INTRAVENOUS | Status: DC
  Filled 2022-05-20: qty 40

## 2022-05-20 NOTE — H&P (Signed)
Amy Terry       Amy Terry,Amy Terry             [phone]      Cardiothoracic Surgery Admission History and Physical   PCP is Copland, Gay Filler, MD Referring Provider is [name], MD Primary Cardiologist is [name], MD   Reason for Admission: Severe bicuspid aortic valve stenosis   HPI:   The patient is a 51 year old woman with a long history of bicuspid aortic valve stenosis that was noted to be moderate in October 2019 with a mean gradient of 25 mmHg and a peak of 46 mmHg.  She was asymptomatic at that time.  She had a follow-up echocardiogram in December 2020 which showed no significant change in the mean gradient at 20 mmHg with a valve area of 1.0 cm.  Left ventricular ejection fraction was normal.  She remained asymptomatic.  She also has a long history of iron deficiency anemia that has not been responsive to oral iron and she saw hematology in May and was set up for IV iron.  Her hemoglobin was 8.6 on 12/24/2021 but after iron therapy her hemoglobin has increased to 12.2 on 01/31/2022.  She has developed exertional shortness of breath, fatigue, and chest discomfort with walking up inclines or stairs over the past several months but said this has improved since her hemoglobin has improved.  Her most recent echo on 01/18/2022 showed an increase in the mean gradient across the aortic valve to 40 mmHg with a peak gradient of 63 mmHg.  Valve area by VTI 0.51 cm consistent with severe aortic stenosis.  Left ventricular ejection fraction is 50 to 55%.  There is trivial mitral valve regurgitation and mild to moderate tricuspid valve regurgitation.  She underwent cardiac catheterization on 01/31/2022 showing a normal right dominant coronary circulation without significant stenosis.  Filling pressures were low with a cardiac index of 3.8.  She had a CT of the chest without contrast in April 2008 that showed the ascending aorta to measure 3.0 cm.  An MRA of the chest  in May 2016 showed the diameter of the ascending aorta to measure 3.3 cm.  A gated cardiac CTA in 06/2018 showed the ascending aorta to have a diameter of 3.6 cm with a descending aortic diameter of 1.9 cm.   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  She is originally from the [place] and has a 51 year old and 51 year old at home.        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   Sinusitis     UTI (urinary tract infection)             Past Surgical History:  Procedure Laterality Date   CESAREAN SECTION       CESAREAN SECTION       RIGHT HEART CATH AND CORONARY ANGIOGRAPHY N/A 01/31/2022    Procedure: RIGHT HEART CATH AND CORONARY ANGIOGRAPHY;  Surgeon: Amy Osmond, MD;  Location: [facility];  Service: Cardiovascular;  Laterality: N/A;   TUBAL LIGATION       TUBAL LIGATION   2007           Family History  Problem Relation Age of Onset   Hypertension Mother  Emphysema Mother     Hypertension Father     Thyroid disease Sister     Breast cancer Sister     Hyperlipidemia Brother     Thyroid disease Paternal Aunt     Thyroid disease Sister     Asthma Sister     Anemia Sister     Thyroid disease Sister        Social History         Socioeconomic History   Marital status: Married      Spouse name: Not on file   Number of children: Not on file   Years of education: Not on file   Highest education level: Not on file  Occupational History   Not on file  Tobacco Use   Smoking status: Never   Smokeless tobacco: Never  Substance and Sexual Activity   Alcohol use: No      Alcohol/week: 0.0 standard drinks of alcohol   Drug use: No   Sexual activity: Yes      Partners: Male  Other Topics Concern   Not on file  Social History Narrative   Not on file    Social Determinants of Health    Financial Resource Strain: Not on file  Food Insecurity: Not on file   Transportation Needs: Not on file  Physical Activity: Not on file  Stress: Not on file  Social Connections: Not on file  Intimate Partner Violence: Not on file             Prior to Admission medications   Medication Sig Start Date End Date Taking? Authorizing Provider  acetaminophen (TYLENOL) 500 MG tablet Take 1,000 mg by mouth every 6 (six) hours as needed for headache or moderate pain.     Yes [provider]  ferrous sulfate 325 (65 FE) MG tablet Take 325 mg by mouth daily with breakfast.     Yes [provider]  fluticasone (FLONASE) 50 MCG/ACT nasal spray Place 1 spray into both nostrils daily as needed for allergies or rhinitis.     Yes [provider]  melatonin 5 MG TABS Take 5 mg by mouth at bedtime as needed (sleep).     Yes [provider]  Phendimetrazine Tartrate 35 MG TABS Take 35 mg by mouth 4 (four) times a week.     Yes [provider]            Current Outpatient Medications  Medication Sig Dispense Refill   acetaminophen (TYLENOL) 500 MG tablet Take 1,000 mg by mouth every 6 (six) hours as needed for headache or moderate pain.       ferrous sulfate 325 (65 FE) MG tablet Take 325 mg by mouth daily with breakfast.       fluticasone (FLONASE) 50 MCG/ACT nasal spray Place 1 spray into both nostrils daily as needed for allergies or rhinitis.       melatonin 5 MG TABS Take 5 mg by mouth at bedtime as needed (sleep).       Phendimetrazine Tartrate 35 MG TABS Take 35 mg by mouth 4 (four) times a week.                 Current Facility-Administered Medications  Medication Dose Route Frequency Provider Last Rate Last Admin   sodium chloride flush (NS) 0.9 % injection 3 mL  3 mL Intravenous Q12H Amy Hector, MD               Allergies  Allergen Reactions   Other Rash      Tegaderm          Review of Systems:               General:                      normal appetite, + decreased energy, no weight gain, no weight  loss, no fever             Cardiac:                       + chest tightness with exertion, no chest pain at rest, +SOB with moderate exertion, no resting SOB, no PND, no orthopnea, + palpitations, no arrhythmia, no atrial fibrillation, no LE edema, no dizzy spells, no syncope             Respiratory:                 + exertional shortness of breath, no home oxygen, no productive cough, no dry cough, no bronchitis, no wheezing, no hemoptysis, no asthma, no pain with inspiration or cough, no sleep apnea, no CPAP at night             GI:                               no difficulty swallowing, no reflux, no frequent heartburn, no hiatal hernia, no abdominal pain, no constipation, no diarrhea, no hematochezia, no hematemesis, no melena             GU:                              no dysuria,  no frequency, + urinary tract infection, no hematuria,  no kidney stones, no kidney disease             Vascular:                     no pain suggestive of claudication, no pain in feet, no leg cramps, + varicose veins, no DVT, no non-healing foot ulcer             Neuro:                         no stroke, no TIA's, no seizures, no headaches, no temporary blindness one eye,  no slurred speech, no peripheral neuropathy, no chronic pain, no instability of gait, no memory/cognitive dysfunction             Musculoskeletal:         no arthritis, no joint swelling, no myalgias, no difficulty walking, normal mobility              Skin:                            no rash, no itching, no skin infections, no pressure sores or ulcerations             Psych:                         no anxiety, no depression, no nervousness, no unusual recent stress  Eyes:                           no blurry vision, no floaters, no recent vision changes, + wears glasses              ENT:                            no hearing loss, no loose or painful teeth, no dentures, last saw dentist this year             Hematologic:               no  easy bruising, no abnormal bleeding, no clotting disorder, no frequent epistaxis             Endocrine:                   no diabetes, does not check CBG's at home                            Physical Exam:               BP 102/67 (BP Location: Left Arm, Patient Position: Sitting, Cuff Size: Normal)   Pulse 93   Resp 20   Ht '5\' 2"'$  (1.575 m)   Wt 138 lb 11.2 oz (62.9 kg)   SpO2 96%   BMI 25.37 kg/m              General:                      well-appearing             HEENT:                       Unremarkable, NCAT, PERLA, EOMI, teeth in good condition             Neck:                           no JVD, no bruits, no adenopathy              Chest:                          clear to auscultation, symmetrical breath sounds, no wheezes, no rhonchi              CV:                              RRR, 3/6 systolic murmur RSB, no diastolic murmur             Abdomen:                    soft, non-tender, no masses              Extremities:                 warm, well-perfused, pulses palpable at ankles, no lower extremity edema             Rectal/GU                   Deferred  Neuro:                         Grossly non-focal and symmetrical throughout             Skin:                            Clean and dry, no rashes, no breakdown   Diagnostic Tests:    Narrative & Impression  CLINICAL DATA:  Aortic valve replacement (TAVR), pre-op eval   EXAM: CT ANGIOGRAPHY CHEST WITH CONTRAST   TECHNIQUE: Multidetector CT imaging of the chest was performed using the standard protocol during bolus administration of intravenous contrast. Multiplanar CT image reconstructions and MIPs were obtained to evaluate the vascular anatomy.   RADIATION DOSE REDUCTION: This exam was performed according to the departmental dose-optimization program which includes automated exposure control, adjustment of the mA and/or kV according to patient size and/or use of iterative reconstruction technique.    CONTRAST:  168m OMNIPAQUE IOHEXOL 350 MG/ML SOLN   COMPARISON:  Chest XR 12/29/2021.  CTA coronary, 07/06/2018   FINDINGS: CARDIOVASCULAR:   Limitations by motion: Mild   Preferential opacification of the thoracic aorta. No evidence of thoracic aortic dissection.   Aortic Root:   --Valve: 2.1 cm   --Sinuses: 2.7 cm   --Sinotubular Junction: 2.7 cm   Thoracic Aorta:   --Ascending Aorta: 3.8 cm   --Aortic Arch: 2.6 cm   --Descending Aorta: 1.9 cm   Other:   Conventional 3-sided LEFT aortic arch. No atherosclerosis or hemodynamic stenosis at the origins of the great vessels.   Bicuspid aortic valve with calcifications   Normal heart size.  No pericardial effusion.   Mediastinum/Nodes: No enlarged mediastinal, hilar, or axillary lymph nodes. 1.5 cm RIGHT inferior pole thyroid nodule with extension into the thoracic inlet. Thyroid gland, trachea, and esophagus demonstrate no significant findings.   Lungs/Pleura: Tree-in-bud nodularities involving lingula. Chronic lingula focal traction bronchiectasis, bronchial wall thickening and focal atelectasis. No pleural effusion or pneumothorax.   Upper Abdomen: No acute abnormality. Focal hepatic hypodensities largest 1.5 cm and within the posterior RIGHT hepatic lobe likely consistent with hemangiomas.   Musculoskeletal: No chest wall abnormality. No acute or significant osseous findings.   Review of the MIP images confirms the above findings.   IMPRESSION: 1. Acute lingular bronchopneumonia, superimposed on chronic lobar bronchiectasis and scarring. 2. Bicuspid aortic valve with valvular calcifications. 3. 3.8 cm ascending thoracic aorta. 4. 1.5 cm incidental RIGHT thyroid nodule. Recommend non-emergent, outpatient [place]yroid. Reference: J Am Coll Radiol. 2015 Feb;12(2): 143-50   These results will be called to the ordering clinician or representative by the Radiologist Assistant, and communication documented  in the PACS or [employer]     Electronically Signed   By: Amy BirksM.D.   On: 02/08/2022 07:20       ECHOCARDIOGRAM REPORT         Patient Name:   Amy DEANDRADEDate of Exam: 01/18/2022  Medical Rec #:  [id number]         Height:       62.0 in  Accession #:    [id number]        Weight:       139.8 lb  Date of Birth:  308-10-1970         BSA:          1.642 m  Patient Age:    75 years           BP:           100/60 mmHg  Patient Gender: F                  HR:           67 bpm.  Exam Location:  [address]   Procedure: 2D Echo, Cardiac Doppler and Color Doppler   Indications:    Q23.1 Bicuspid aortic valve     History:        Patient has prior history of Echocardiogram examinations,  most                  recent 07/15/2019. Bicuspid aortic valve;  Signs/Symptoms:AS.     Sonographer:    Amy Terry RDCS  Referring Phys: Amy Terry     1. Left ventricular ejection fraction, by estimation, is 55 to 60%. The  left ventricle has normal function. The left ventricle has no regional  wall motion abnormalities. Left ventricular diastolic parameters were  normal.   2. Right ventricular systolic function is normal. The right ventricular  size is normal. There is normal pulmonary artery systolic pressure. The  estimated right ventricular systolic pressure is 17.5 mmHg.   3. The mitral valve is normal in structure. Trivial mitral valve  regurgitation. No evidence of mitral stenosis.   4. Tricuspid valve regurgitation is mild to moderate.   5. The aortic valve is abnormal. Unable to determine aortic valve  morphology due to image quality on this study, however previously  described as bicuspid. Review of prior studies suggests calcified raphe  and fusion of left and non-coronary cusps. Aortic   valve regurgitation is trivial. Severe aortic valve stenosis. Aortic  valve mean gradient measures 40.0 mmHg. Aortic valve Vmax measures 3.97  m/s.  Valve area 0.51 cm2 but may be underestimated due to low LVOT TVI.   6. The inferior vena cava is normal in size with greater than 50%  respiratory variability, suggesting right atrial pressure of 3 mmHg.   Comparison(s): A prior study was performed on 07/15/2019. Changes from  prior study are noted. BAV AS has progressed to severe stenosis.   FINDINGS   Left Ventricle: Left ventricular ejection fraction, by estimation, is 55  to 60%. The left ventricle has normal function. The left ventricle has no  regional wall motion abnormalities. The left ventricular internal cavity  size was normal in size. There is   no left ventricular hypertrophy. Left ventricular diastolic parameters  were normal.   Right Ventricle: The right ventricular size is normal. No increase in  right ventricular wall thickness. Right ventricular systolic function is  normal. There is normal pulmonary artery systolic pressure. The tricuspid  regurgitant velocity is 2.39 m/s, and   with an assumed right atrial pressure of 3 mmHg, the estimated right  ventricular systolic pressure is 10.2 mmHg.   Left Atrium: Left atrial size was normal in size.   Right Atrium: Right atrial size was normal in size.   Pericardium: There is no evidence of pericardial effusion.   Mitral Valve: The mitral valve is normal in structure. Trivial mitral  valve regurgitation. No evidence of mitral valve stenosis.   Tricuspid Valve: The tricuspid valve is normal in structure. Tricuspid  valve regurgitation is mild to moderate. No evidence of tricuspid  stenosis.   Aortic Valve: The aortic valve is abnormal.  Aortic valve regurgitation is  trivial. Severe aortic stenosis is present. Aortic valve mean gradient  measures 40.0 mmHg. Aortic valve peak gradient measures 63.0 mmHg. Aortic  valve area, by VTI measures 0.51  cm.   Pulmonic Valve: The pulmonic valve was normal in structure. Pulmonic valve  regurgitation is not visualized. No  evidence of pulmonic stenosis.   Aorta: The aortic root is normal in size and structure.   Venous: The inferior vena cava is normal in size with greater than 50%  respiratory variability, suggesting right atrial pressure of 3 mmHg.   IAS/Shunts: No atrial level shunt detected by color flow Doppler.      LEFT VENTRICLE  PLAX 2D  LVIDd:         4.30 cm   Diastology  LVIDs:         2.90 cm   LV e' medial:    9.14 cm/s  LV PW:         1.20 cm   LV E/e' medial:  8.4  LV IVS:        1.00 cm   LV e' lateral:   13.80 cm/s  LVOT diam:     2.00 cm   LV E/e' lateral: 5.6  LV SV:         48  LV SV Index:   29  LVOT Area:     3.14 cm      RIGHT VENTRICLE             IVC  RV S prime:     11.20 cm/s  IVC diam: 1.40 cm  TAPSE (M-mode): 1.9 cm  RVSP:           25.8 mmHg   LEFT ATRIUM             Index        RIGHT ATRIUM           Index  LA diam:        2.50 cm 1.52 cm/m   RA Pressure: 3.00 mmHg  LA Vol (A2C):   36.0 ml 21.93 ml/m  RA Area:     10.90 cm  LA Vol (A4C):   30.5 ml 18.58 ml/m  RA Volume:   22.10 ml  13.46 ml/m  LA Biplane Vol: 35.7 ml 21.74 ml/m   AORTIC VALVE  AV Area (Vmax):    0.58 cm  AV Area (Vmean):   0.55 cm  AV Area (VTI):     0.51 cm  AV Vmax:           397.00 cm/s  AV Vmean:          294.000 cm/s  AV VTI:            0.943 m  AV Peak Grad:      63.0 mmHg  AV Mean Grad:      40.0 mmHg  LVOT Vmax:         73.40 cm/s  LVOT Vmean:        51.900 cm/s  LVOT VTI:          0.153 m  LVOT/AV VTI ratio: 0.16     AORTA  Ao Root diam: 2.80 cm  Ao Asc diam:  3.30 cm   MITRAL VALVE               TRICUSPID VALVE  MV Area (PHT): 3.40 cm    TR Peak grad:   22.8 mmHg  MV Decel Time: 223 msec  TR Vmax:        239.00 cm/s  MV E velocity: 77.00 cm/s  Estimated RAP:  3.00 mmHg  MV A velocity: 73.90 cm/s  RVSP:           25.8 mmHg  MV E/A ratio:  1.04                             SHUNTS                             Systemic VTI:  0.15 m                              Systemic Diam: 2.00 cm   Amy Kaiser MD  Electronically signed by Amy Kaiser MD  Signature Date/Time: 01/18/2022/12:27:42 PM     Physicians   Panel Physicians Referring Physician Case Authorizing Physician  Amy Osmond, MD (Primary)        Procedures   RIGHT HEART CATH AND CORONARY ANGIOGRAPHY    Conclusion   1.  Normal right dominant circulation 2.  Cardiac output of 6.3 L/min and cardiac index of 3.8 L/min/m with relatively low filling pressures.   Recommendation: Cardiothoracic surgical consultation for surgical aortic valve replacement.   Indications   Aortic stenosis due to bicuspid aortic valve [Q23.0, Q23.1 (ICD-10-CM)]    Clinical Presentation   CHF/Shock Congestive heart failure not present. No shock present.      Procedural Details   Technical Details The patient is a 51 year old female with a history of severe symptomatic bicuspid aortic stenosis and iron deficiency anemia was evaluated in the outpatient setting due to increasing dyspnea.  She was referred for right heart catheterization and coronary angiography study for preoperative assessment.  After obtaining consent the patient was brought to the cardiac catheterization laboratory prepped draped sterile fashion ultrasound was used to gain access to the right radial artery and a 6 French glide sheath placed there.  '5mg'$  of verapamil and 5000 units of heparin were administered through the sheath.  A previously placed antecubital IV was exchanged for a 5 French femoral glide sheath.  Right heart catheterization and coronary angiography study was then performed.  At the conclusion of the procedure manual pressure was applied to the antecubital site and a TR band was placed on the radial site.  Estimated blood loss <50 mL.   During this procedure medications were administered to achieve and maintain moderate conscious sedation while the patient's heart rate, blood pressure, and oxygen saturation were  continuously monitored and I was present face-to-face 100% of this time.      Medications (Filter: Administrations occurring from 0719 to 0831 on 01/31/22) Heparin (Porcine) in NaCl 1000-0.9 UT/500ML-% SOLN (mL) Total volume:  1,000 mL  Date/Time Rate/Dose/Volume Action    01/31/22 0729 500 mL Given    0729 500 mL Given      midazolam (VERSED) injection (mg) Total dose:  1 mg  Date/Time Rate/Dose/Volume Action    01/31/22 0741 1 mg Given      lidocaine (PF) (XYLOCAINE) 1 % injection (mL) Total volume:  4 mL  Date/Time Rate/Dose/Volume Action    01/31/22 0746 2 mL Given    0748 2 mL Given      Radial Cocktail/Verapamil only Total dose:  Cannot be calculated*  *Administration dose not documented Date/Time Rate/Dose/Volume Action  01/31/22 0748   Given      heparin sodium (porcine) injection (Units) Total dose:  5,000 Units  Date/Time Rate/Dose/Volume Action    01/31/22 0749 5,000 Units Given      iohexol (OMNIPAQUE) 350 MG/ML injection (mL) Total volume:  30 mL  Date/Time Rate/Dose/Volume Action    01/31/22 0831 30 mL Given      Sedation Time   Sedation Time Physician-1: 42 minutes 24 seconds Contrast   Medication Name Total Dose  iohexol (OMNIPAQUE) 350 MG/ML injection 30 mL    Radiation/Fluoro   Fluoro time: 4.5 (min) DAP: 4 (Gycm2) Cumulative Air Kerma: 74 (mGy) Complications      Complications documented before study signed (01/31/2022  7:82 AM)     No complications were associated with this study.  Documented by Amy [facility], RT - 01/31/2022  8:27 AM      Coronary Findings   Diagnostic Dominance: Right No diagnostic findings have been documented. Intervention   No interventions have been documented. Coronary Diagrams   Diagnostic Dominance: Right  Intervention   Implants      No implant documentation for this case.    [employer] Images    Show images for CARDIAC CATHETERIZATION Images on [employer]    Show images for  Klausner, Amy Terry to Procedure Log   Procedure Log    Hemo Data   Flowsheet Row Most Recent Value  Fick Cardiac Output 6.31 L/min  Fick Cardiac Output Index 3.88 (L/min)/BSA  RA A Wave 6 mmHg  RA V Wave 3 mmHg  RA Mean 1 mmHg  RV Systolic Pressure 20 mmHg  RV Diastolic Pressure -6 mmHg  RV EDP 3 mmHg  PA Systolic Pressure 20 mmHg  PA Diastolic Pressure 5 mmHg  PA Mean 12 mmHg  PW A Wave 11 mmHg  PW V Wave 9 mmHg  PW Mean 6 mmHg  AO Systolic Pressure 92 mmHg  AO Diastolic Pressure 52 mmHg  AO Mean 69 mmHg  QP/QS 1.05  TPVR Index 2.94 HRUI  TSVR Index 16.23 HRUI  PVR SVR Ratio 0.09  TPVR/TSVR Ratio 0.18        Impression:   This 51 year old woman has stage D, severe, symptomatic aortic stenosis with New York Heart Association class II symptoms of exertional fatigue and shortness of breath as well as exertional chest tightness consistent with chronic diastolic congestive heart failure.  Her echocardiogram shows an indeterminate number of of aortic valve leaflets with a mean gradient across the aortic valve that has increased to 40 mmHg and a valve area of 0.51 cm consistent with severe aortic stenosis.  Her left ventricular systolic function is normal.  Recent CTA of the chest shows her ascending aorta is 2 times the size of her descending aorta, although only 3.8 cm.  It has increased from 3.0 cm in 2008.  The aortic root appears to be of normal size.  I think the best treatment for her is aortic valve replacement with supra-coronary replacement of the ascending aorta up to the proximal aortic arch.  I reviewed the CTA images with her and answered her questions.  She has a long history of iron deficiency anemia of unclear etiology and has been treated by hematology.  I think it would be best to use a bioprosthetic valve in her case even though she is 51 years old.  If she had a mechanical valve with lifelong need for Coumadin it could be a disaster if she continued to have  iron deficiency anemia due to blood loss in her GI tract or elsewhere.  The durability of the Edwards Inspiris Resilia pericardial valve is expected to be 20+ years and I think the benefit of not having to be on Coumadin for her outweighs the risk of structural valve deterioration.  I discussed this with her and she is in agreement. I discussed the operative procedure with the patient  including alternatives, benefits and risks; including but not limited to bleeding, blood transfusion, infection, stroke, myocardial infarction, graft failure, heart block requiring a permanent pacemaker, organ dysfunction, and death.  [employer] understands and agrees to proceed.     Plan:   Aortic valve replacement and replacement of the ascending aorta using a bioprosthetic valve with hypothermic circulatory arrest.     [name], MD

## 2022-05-20 NOTE — Anesthesia Preprocedure Evaluation (Signed)
Anesthesia Evaluation  Patient identified by MRN, date of birth, ID band Patient awake    Reviewed: Allergy & Precautions, H&P , NPO status , Patient's Chart, lab work & pertinent test results  Airway Mallampati: II  TM Distance: >3 FB Neck ROM: Full    Dental no notable dental hx. (+) Teeth Intact, Dental Advisory Given   Pulmonary neg pulmonary ROS,    Pulmonary exam normal breath sounds clear to auscultation       Cardiovascular Exercise Tolerance: Good + Peripheral Vascular Disease  + Valvular Problems/Murmurs AS  Rhythm:Regular Rate:Normal     Neuro/Psych  Headaches, negative psych ROS   GI/Hepatic negative GI ROS, Neg liver ROS,   Endo/Other  negative endocrine ROS  Renal/GU negative Renal ROS  negative genitourinary   Musculoskeletal   Abdominal   Peds  Hematology  (+) Blood dyscrasia, anemia ,   Anesthesia Other Findings   Reproductive/Obstetrics negative OB ROS                            Anesthesia Physical Anesthesia Plan  ASA: 4  Anesthesia Plan: General   Post-op Pain Management: Tylenol PO (pre-op)*   Induction: Intravenous  PONV Risk Score and Plan: 3 and Ondansetron, Midazolam and Treatment may vary due to age or medical condition  Airway Management Planned: Oral ETT  Additional Equipment: Arterial line, CVP, PA Cath, TEE and Ultrasound Guidance Line Placement  Intra-op Plan: Delibrate Circulatory arrest per surgeon request  Post-operative Plan: Post-operative intubation/ventilation  Informed Consent: I have reviewed the patients History and Physical, chart, labs and discussed the procedure including the risks, benefits and alternatives for the proposed anesthesia with the patient or authorized representative who has indicated his/her understanding and acceptance.     Dental advisory given  Plan Discussed with: CRNA  Anesthesia Plan Comments:         Anesthesia Quick Evaluation

## 2022-05-21 ENCOUNTER — Inpatient Hospital Stay (HOSPITAL_COMMUNITY)
Admission: RE | Admit: 2022-05-21 | Discharge: 2022-05-28 | DRG: 220 | Disposition: A | Discharge status: Home / Self Care | Payer: 59 | Attending: Surgery | Admitting: Surgery

## 2022-05-21 ENCOUNTER — Inpatient Hospital Stay (HOSPITAL_COMMUNITY): Payer: 59

## 2022-05-21 ENCOUNTER — Other Ambulatory Visit: Payer: Self-pay

## 2022-05-21 ENCOUNTER — Inpatient Hospital Stay (HOSPITAL_COMMUNITY): Payer: 59 | Admitting: Certified Registered Nurse Anesthetist

## 2022-05-21 ENCOUNTER — Other Ambulatory Visit: Payer: Self-pay | Admitting: Surgery

## 2022-05-21 ENCOUNTER — Encounter (HOSPITAL_COMMUNITY): Payer: Self-pay | Admitting: Surgery

## 2022-05-21 ENCOUNTER — Encounter (HOSPITAL_COMMUNITY)
Admission: RE | Disposition: A | Discharge status: Home / Self Care | Payer: Self-pay | Source: Home / Self Care | Attending: Surgery

## 2022-05-21 ENCOUNTER — Inpatient Hospital Stay (HOSPITAL_COMMUNITY): Payer: 59 | Admitting: Physician Assistant

## 2022-05-21 DIAGNOSIS — M25511 Pain in right shoulder: Secondary | ICD-10-CM | POA: Diagnosis not present

## 2022-05-21 DIAGNOSIS — Z881 Allergy status to other antibiotic agents status: Secondary | ICD-10-CM

## 2022-05-21 DIAGNOSIS — E8770 Fluid overload, unspecified: Secondary | ICD-10-CM | POA: Diagnosis not present

## 2022-05-21 DIAGNOSIS — Z952 Presence of prosthetic heart valve: Secondary | ICD-10-CM | POA: Diagnosis not present

## 2022-05-21 DIAGNOSIS — Z98891 History of uterine scar from previous surgery: Secondary | ICD-10-CM | POA: Diagnosis not present

## 2022-05-21 DIAGNOSIS — J939 Pneumothorax, unspecified: Secondary | ICD-10-CM | POA: Diagnosis present

## 2022-05-21 DIAGNOSIS — Z79899 Other long term (current) drug therapy: Secondary | ICD-10-CM

## 2022-05-21 DIAGNOSIS — Z8349 Family history of other endocrine, nutritional and metabolic diseases: Secondary | ICD-10-CM

## 2022-05-21 DIAGNOSIS — D649 Anemia, unspecified: Secondary | ICD-10-CM | POA: Diagnosis not present

## 2022-05-21 DIAGNOSIS — D509 Iron deficiency anemia, unspecified: Secondary | ICD-10-CM | POA: Diagnosis not present

## 2022-05-21 DIAGNOSIS — Z8701 Personal history of pneumonia (recurrent): Secondary | ICD-10-CM | POA: Diagnosis not present

## 2022-05-21 DIAGNOSIS — R739 Hyperglycemia, unspecified: Secondary | ICD-10-CM | POA: Diagnosis not present

## 2022-05-21 DIAGNOSIS — Z7982 Long term (current) use of aspirin: Secondary | ICD-10-CM | POA: Diagnosis not present

## 2022-05-21 DIAGNOSIS — Z91048 Other nonmedicinal substance allergy status: Secondary | ICD-10-CM | POA: Diagnosis not present

## 2022-05-21 DIAGNOSIS — Z01818 Encounter for other preprocedural examination: Secondary | ICD-10-CM

## 2022-05-21 DIAGNOSIS — R112 Nausea with vomiting, unspecified: Secondary | ICD-10-CM | POA: Diagnosis not present

## 2022-05-21 DIAGNOSIS — Q231 Congenital insufficiency of aortic valve: Secondary | ICD-10-CM | POA: Diagnosis not present

## 2022-05-21 DIAGNOSIS — I7121 Aneurysm of the ascending aorta, without rupture: Secondary | ICD-10-CM | POA: Diagnosis not present

## 2022-05-21 DIAGNOSIS — I739 Peripheral vascular disease, unspecified: Secondary | ICD-10-CM | POA: Diagnosis present

## 2022-05-21 DIAGNOSIS — Z825 Family history of asthma and other chronic lower respiratory diseases: Secondary | ICD-10-CM

## 2022-05-21 DIAGNOSIS — R42 Dizziness and giddiness: Secondary | ICD-10-CM | POA: Diagnosis not present

## 2022-05-21 DIAGNOSIS — I358 Other nonrheumatic aortic valve disorders: Secondary | ICD-10-CM | POA: Diagnosis not present

## 2022-05-21 DIAGNOSIS — Z4682 Encounter for fitting and adjustment of non-vascular catheter: Secondary | ICD-10-CM | POA: Diagnosis not present

## 2022-05-21 DIAGNOSIS — Z9851 Tubal ligation status: Secondary | ICD-10-CM

## 2022-05-21 DIAGNOSIS — Z8616 Personal history of COVID-19: Secondary | ICD-10-CM

## 2022-05-21 DIAGNOSIS — Q230 Congenital stenosis of aortic valve: Secondary | ICD-10-CM | POA: Diagnosis not present

## 2022-05-21 DIAGNOSIS — Z83438 Family history of other disorder of lipoprotein metabolism and other lipidemia: Secondary | ICD-10-CM

## 2022-05-21 DIAGNOSIS — R000 Tachycardia, unspecified: Secondary | ICD-10-CM | POA: Diagnosis not present

## 2022-05-21 DIAGNOSIS — Z87440 Personal history of urinary (tract) infections: Secondary | ICD-10-CM

## 2022-05-21 DIAGNOSIS — Z91041 Radiographic dye allergy status: Secondary | ICD-10-CM | POA: Diagnosis not present

## 2022-05-21 DIAGNOSIS — I088 Other rheumatic multiple valve diseases: Secondary | ICD-10-CM | POA: Diagnosis not present

## 2022-05-21 DIAGNOSIS — I959 Hypotension, unspecified: Secondary | ICD-10-CM | POA: Diagnosis not present

## 2022-05-21 DIAGNOSIS — Z8249 Family history of ischemic heart disease and other diseases of the circulatory system: Secondary | ICD-10-CM

## 2022-05-21 DIAGNOSIS — K5900 Constipation, unspecified: Secondary | ICD-10-CM | POA: Diagnosis not present

## 2022-05-21 DIAGNOSIS — R011 Cardiac murmur, unspecified: Secondary | ICD-10-CM | POA: Diagnosis present

## 2022-05-21 DIAGNOSIS — J9811 Atelectasis: Secondary | ICD-10-CM | POA: Diagnosis not present

## 2022-05-21 DIAGNOSIS — I700 Atherosclerosis of aorta: Secondary | ICD-10-CM | POA: Diagnosis not present

## 2022-05-21 DIAGNOSIS — J90 Pleural effusion, not elsewhere classified: Secondary | ICD-10-CM | POA: Diagnosis not present

## 2022-05-21 DIAGNOSIS — I350 Nonrheumatic aortic (valve) stenosis: Secondary | ICD-10-CM | POA: Diagnosis not present

## 2022-05-21 DIAGNOSIS — I714 Abdominal aortic aneurysm, without rupture, unspecified: Secondary | ICD-10-CM | POA: Diagnosis not present

## 2022-05-21 DIAGNOSIS — Z803 Family history of malignant neoplasm of breast: Secondary | ICD-10-CM

## 2022-05-21 DIAGNOSIS — M546 Pain in thoracic spine: Secondary | ICD-10-CM | POA: Diagnosis not present

## 2022-05-21 DIAGNOSIS — Z8619 Personal history of other infectious and parasitic diseases: Secondary | ICD-10-CM | POA: Diagnosis not present

## 2022-05-21 DIAGNOSIS — Z832 Family history of diseases of the blood and blood-forming organs and certain disorders involving the immune mechanism: Secondary | ICD-10-CM

## 2022-05-21 HISTORY — PX: AORTIC VALVE REPLACEMENT: SHX41

## 2022-05-21 HISTORY — PX: REPLACEMENT ASCENDING AORTA: SHX6068

## 2022-05-21 HISTORY — PX: TEE WITHOUT CARDIOVERSION: SHX5443

## 2022-05-21 LAB — CBC
HCT: 36.8 % (ref 36.0–46.0)
HCT: 32.5 % — ABNORMAL LOW (ref 36.0–46.0)
Hemoglobin: 11.9 g/dL — ABNORMAL LOW (ref 12.0–15.0)
Hemoglobin: 10.9 g/dL — ABNORMAL LOW (ref 12.0–15.0)
MCH: 28.9 pg (ref 26.0–34.0)
MCH: 29.4 pg (ref 26.0–34.0)
MCHC: 32.3 g/dL (ref 30.0–36.0)
MCHC: 33.5 g/dL (ref 30.0–36.0)
MCV: 89.3 fL (ref 80.0–100.0)
MCV: 87.6 fL (ref 80.0–100.0)
Platelets: 118 10*3/uL — ABNORMAL LOW (ref 150–400)
Platelets: 136 10*3/uL — ABNORMAL LOW (ref 150–400)
RBC: 4.12 MIL/uL (ref 3.87–5.11)
RBC: 3.71 MIL/uL — ABNORMAL LOW (ref 3.87–5.11)
RDW: 14.4 % (ref 11.5–15.5)
RDW: 14.6 % (ref 11.5–15.5)
WBC: 12.2 10*3/uL — ABNORMAL HIGH (ref 4.0–10.5)
WBC: 10.9 10*3/uL — ABNORMAL HIGH (ref 4.0–10.5)
nRBC: 0 % (ref 0.0–0.2)
nRBC: 0 % (ref 0.0–0.2)

## 2022-05-21 LAB — POCT I-STAT 7, (LYTES, BLD GAS, ICA,H+H)
Acid-Base Excess: 1 mmol/L (ref 0.0–2.0)
Acid-Base Excess: 3 mmol/L — ABNORMAL HIGH (ref 0.0–2.0)
Acid-Base Excess: 6 mmol/L — ABNORMAL HIGH (ref 0.0–2.0)
Acid-Base Excess: 4 mmol/L — ABNORMAL HIGH (ref 0.0–2.0)
Acid-Base Excess: 2 mmol/L (ref 0.0–2.0)
Acid-Base Excess: 2 mmol/L (ref 0.0–2.0)
Acid-Base Excess: 2 mmol/L (ref 0.0–2.0)
Acid-base deficit: 2 mmol/L (ref 0.0–2.0)
Acid-base deficit: 3 mmol/L — ABNORMAL HIGH (ref 0.0–2.0)
Acid-base deficit: 2 mmol/L (ref 0.0–2.0)
Acid-base deficit: 4 mmol/L — ABNORMAL HIGH (ref 0.0–2.0)
Bicarbonate: 27.8 mmol/L (ref 20.0–28.0)
Bicarbonate: 27.5 mmol/L (ref 20.0–28.0)
Bicarbonate: 28.8 mmol/L — ABNORMAL HIGH (ref 20.0–28.0)
Bicarbonate: 29.2 mmol/L — ABNORMAL HIGH (ref 20.0–28.0)
Bicarbonate: 25.9 mmol/L (ref 20.0–28.0)
Bicarbonate: 26.5 mmol/L (ref 20.0–28.0)
Bicarbonate: 23.6 mmol/L (ref 20.0–28.0)
Bicarbonate: 27 mmol/L (ref 20.0–28.0)
Bicarbonate: 22.8 mmol/L (ref 20.0–28.0)
Bicarbonate: 24.1 mmol/L (ref 20.0–28.0)
Bicarbonate: 21.6 mmol/L (ref 20.0–28.0)
Calcium, Ion: 1.2 mmol/L (ref 1.15–1.40)
Calcium, Ion: 0.87 mmol/L — CL (ref 1.15–1.40)
Calcium, Ion: 0.92 mmol/L — ABNORMAL LOW (ref 1.15–1.40)
Calcium, Ion: 0.94 mmol/L — ABNORMAL LOW (ref 1.15–1.40)
Calcium, Ion: 0.87 mmol/L — CL (ref 1.15–1.40)
Calcium, Ion: 0.91 mmol/L — ABNORMAL LOW (ref 1.15–1.40)
Calcium, Ion: 0.93 mmol/L — ABNORMAL LOW (ref 1.15–1.40)
Calcium, Ion: 0.99 mmol/L — ABNORMAL LOW (ref 1.15–1.40)
Calcium, Ion: 1.03 mmol/L — ABNORMAL LOW (ref 1.15–1.40)
Calcium, Ion: 1.05 mmol/L — ABNORMAL LOW (ref 1.15–1.40)
Calcium, Ion: 1.04 mmol/L — ABNORMAL LOW (ref 1.15–1.40)
HCT: 34 % — ABNORMAL LOW (ref 36.0–46.0)
HCT: 24 % — ABNORMAL LOW (ref 36.0–46.0)
HCT: 26 % — ABNORMAL LOW (ref 36.0–46.0)
HCT: 25 % — ABNORMAL LOW (ref 36.0–46.0)
HCT: 26 % — ABNORMAL LOW (ref 36.0–46.0)
HCT: 31 % — ABNORMAL LOW (ref 36.0–46.0)
HCT: 30 % — ABNORMAL LOW (ref 36.0–46.0)
HCT: 32 % — ABNORMAL LOW (ref 36.0–46.0)
HCT: 34 % — ABNORMAL LOW (ref 36.0–46.0)
HCT: 33 % — ABNORMAL LOW (ref 36.0–46.0)
HCT: 37 % (ref 36.0–46.0)
Hemoglobin: 11.6 g/dL — ABNORMAL LOW (ref 12.0–15.0)
Hemoglobin: 8.2 g/dL — ABNORMAL LOW (ref 12.0–15.0)
Hemoglobin: 8.8 g/dL — ABNORMAL LOW (ref 12.0–15.0)
Hemoglobin: 8.5 g/dL — ABNORMAL LOW (ref 12.0–15.0)
Hemoglobin: 10.5 g/dL — ABNORMAL LOW (ref 12.0–15.0)
Hemoglobin: 8.8 g/dL — ABNORMAL LOW (ref 12.0–15.0)
Hemoglobin: 10.2 g/dL — ABNORMAL LOW (ref 12.0–15.0)
Hemoglobin: 10.9 g/dL — ABNORMAL LOW (ref 12.0–15.0)
Hemoglobin: 11.6 g/dL — ABNORMAL LOW (ref 12.0–15.0)
Hemoglobin: 11.2 g/dL — ABNORMAL LOW (ref 12.0–15.0)
Hemoglobin: 12.6 g/dL (ref 12.0–15.0)
O2 Saturation: 100 %
O2 Saturation: 100 %
O2 Saturation: 97 %
O2 Saturation: 100 %
O2 Saturation: 100 %
O2 Saturation: 100 %
O2 Saturation: 100 %
O2 Saturation: 100 %
O2 Saturation: 98 %
O2 Saturation: 99 %
O2 Saturation: 99 %
Patient temperature: 34.8
Patient temperature: 36.9
Patient temperature: 37.2
Potassium: 3.6 mmol/L (ref 3.5–5.1)
Potassium: 3.6 mmol/L (ref 3.5–5.1)
Potassium: 3.9 mmol/L (ref 3.5–5.1)
Potassium: 4.3 mmol/L (ref 3.5–5.1)
Potassium: 4.4 mmol/L (ref 3.5–5.1)
Potassium: 4.5 mmol/L (ref 3.5–5.1)
Potassium: 3.9 mmol/L (ref 3.5–5.1)
Potassium: 4.6 mmol/L (ref 3.5–5.1)
Potassium: 3.4 mmol/L — ABNORMAL LOW (ref 3.5–5.1)
Potassium: 4.4 mmol/L (ref 3.5–5.1)
Potassium: 4.3 mmol/L (ref 3.5–5.1)
Sodium: 140 mmol/L (ref 135–145)
Sodium: 140 mmol/L (ref 135–145)
Sodium: 143 mmol/L (ref 135–145)
Sodium: 139 mmol/L (ref 135–145)
Sodium: 139 mmol/L (ref 135–145)
Sodium: 140 mmol/L (ref 135–145)
Sodium: 141 mmol/L (ref 135–145)
Sodium: 141 mmol/L (ref 135–145)
Sodium: 143 mmol/L (ref 135–145)
Sodium: 142 mmol/L (ref 135–145)
Sodium: 143 mmol/L (ref 135–145)
TCO2: 29 mmol/L (ref 22–32)
TCO2: 29 mmol/L (ref 22–32)
TCO2: 30 mmol/L (ref 22–32)
TCO2: 31 mmol/L (ref 22–32)
TCO2: 27 mmol/L (ref 22–32)
TCO2: 28 mmol/L (ref 22–32)
TCO2: 25 mmol/L (ref 22–32)
TCO2: 28 mmol/L (ref 22–32)
TCO2: 24 mmol/L (ref 22–32)
TCO2: 26 mmol/L (ref 22–32)
TCO2: 23 mmol/L (ref 22–32)
pCO2 arterial: 50.9 mmHg — ABNORMAL HIGH (ref 32–48)
pCO2 arterial: 35.6 mmHg (ref 32–48)
pCO2 arterial: 38.2 mmHg (ref 32–48)
pCO2 arterial: 49.5 mmHg — ABNORMAL HIGH (ref 32–48)
pCO2 arterial: 36.7 mmHg (ref 32–48)
pCO2 arterial: 41.1 mmHg (ref 32–48)
pCO2 arterial: 40.9 mmHg (ref 32–48)
pCO2 arterial: 43.9 mmHg (ref 32–48)
pCO2 arterial: 41 mmHg (ref 32–48)
pCO2 arterial: 46.6 mmHg (ref 32–48)
pCO2 arterial: 39.7 mmHg (ref 32–48)
pH, Arterial: 7.346 — ABNORMAL LOW (ref 7.35–7.45)
pH, Arterial: 7.464 — ABNORMAL HIGH (ref 7.35–7.45)
pH, Arterial: 7.517 — ABNORMAL HIGH (ref 7.35–7.45)
pH, Arterial: 7.379 (ref 7.35–7.45)
pH, Arterial: 7.417 (ref 7.35–7.45)
pH, Arterial: 7.456 — ABNORMAL HIGH (ref 7.35–7.45)
pH, Arterial: 7.37 (ref 7.35–7.45)
pH, Arterial: 7.397 (ref 7.35–7.45)
pH, Arterial: 7.343 — ABNORMAL LOW (ref 7.35–7.45)
pH, Arterial: 7.321 — ABNORMAL LOW (ref 7.35–7.45)
pH, Arterial: 7.344 — ABNORMAL LOW (ref 7.35–7.45)
pO2, Arterial: 563 mmHg — ABNORMAL HIGH (ref 83–108)
pO2, Arterial: 581 mmHg — ABNORMAL HIGH (ref 83–108)
pO2, Arterial: 84 mmHg (ref 83–108)
pO2, Arterial: 519 mmHg — ABNORMAL HIGH (ref 83–108)
pO2, Arterial: 359 mmHg — ABNORMAL HIGH (ref 83–108)
pO2, Arterial: 395 mmHg — ABNORMAL HIGH (ref 83–108)
pO2, Arterial: 190 mmHg — ABNORMAL HIGH (ref 83–108)
pO2, Arterial: 342 mmHg — ABNORMAL HIGH (ref 83–108)
pO2, Arterial: 106 mmHg (ref 83–108)
pO2, Arterial: 127 mmHg — ABNORMAL HIGH (ref 83–108)
pO2, Arterial: 175 mmHg — ABNORMAL HIGH (ref 83–108)

## 2022-05-21 LAB — POCT I-STAT, CHEM 8
BUN: 7 mg/dL (ref 6–20)
BUN: 5 mg/dL — ABNORMAL LOW (ref 6–20)
BUN: 6 mg/dL (ref 6–20)
BUN: 6 mg/dL (ref 6–20)
BUN: 6 mg/dL (ref 6–20)
BUN: 6 mg/dL (ref 6–20)
Calcium, Ion: 1.14 mmol/L — ABNORMAL LOW (ref 1.15–1.40)
Calcium, Ion: 0.9 mmol/L — ABNORMAL LOW (ref 1.15–1.40)
Calcium, Ion: 1.17 mmol/L (ref 1.15–1.40)
Calcium, Ion: 0.87 mmol/L — CL (ref 1.15–1.40)
Calcium, Ion: 0.93 mmol/L — ABNORMAL LOW (ref 1.15–1.40)
Calcium, Ion: 0.99 mmol/L — ABNORMAL LOW (ref 1.15–1.40)
Chloride: 105 mmol/L (ref 98–111)
Chloride: 100 mmol/L (ref 98–111)
Chloride: 105 mmol/L (ref 98–111)
Chloride: 104 mmol/L (ref 98–111)
Chloride: 104 mmol/L (ref 98–111)
Chloride: 104 mmol/L (ref 98–111)
Creatinine, Ser: 0.3 mg/dL — ABNORMAL LOW (ref 0.44–1.00)
Creatinine, Ser: 0.3 mg/dL — ABNORMAL LOW (ref 0.44–1.00)
Creatinine, Ser: 0.4 mg/dL — ABNORMAL LOW (ref 0.44–1.00)
Creatinine, Ser: 0.3 mg/dL — ABNORMAL LOW (ref 0.44–1.00)
Creatinine, Ser: 0.3 mg/dL — ABNORMAL LOW (ref 0.44–1.00)
Creatinine, Ser: 0.3 mg/dL — ABNORMAL LOW (ref 0.44–1.00)
Glucose, Bld: 98 mg/dL (ref 70–99)
Glucose, Bld: 118 mg/dL — ABNORMAL HIGH (ref 70–99)
Glucose, Bld: 97 mg/dL (ref 70–99)
Glucose, Bld: 173 mg/dL — ABNORMAL HIGH (ref 70–99)
Glucose, Bld: 144 mg/dL — ABNORMAL HIGH (ref 70–99)
Glucose, Bld: 152 mg/dL — ABNORMAL HIGH (ref 70–99)
HCT: 34 % — ABNORMAL LOW (ref 36.0–46.0)
HCT: 26 % — ABNORMAL LOW (ref 36.0–46.0)
HCT: 32 % — ABNORMAL LOW (ref 36.0–46.0)
HCT: 29 % — ABNORMAL LOW (ref 36.0–46.0)
HCT: 29 % — ABNORMAL LOW (ref 36.0–46.0)
HCT: 30 % — ABNORMAL LOW (ref 36.0–46.0)
Hemoglobin: 11.6 g/dL — ABNORMAL LOW (ref 12.0–15.0)
Hemoglobin: 10.9 g/dL — ABNORMAL LOW (ref 12.0–15.0)
Hemoglobin: 8.8 g/dL — ABNORMAL LOW (ref 12.0–15.0)
Hemoglobin: 9.9 g/dL — ABNORMAL LOW (ref 12.0–15.0)
Hemoglobin: 9.9 g/dL — ABNORMAL LOW (ref 12.0–15.0)
Hemoglobin: 10.2 g/dL — ABNORMAL LOW (ref 12.0–15.0)
Potassium: 3.7 mmol/L (ref 3.5–5.1)
Potassium: 3.5 mmol/L (ref 3.5–5.1)
Potassium: 3.6 mmol/L (ref 3.5–5.1)
Potassium: 4.4 mmol/L (ref 3.5–5.1)
Potassium: 4.6 mmol/L (ref 3.5–5.1)
Potassium: 3.9 mmol/L (ref 3.5–5.1)
Sodium: 141 mmol/L (ref 135–145)
Sodium: 140 mmol/L (ref 135–145)
Sodium: 144 mmol/L (ref 135–145)
Sodium: 139 mmol/L (ref 135–145)
Sodium: 141 mmol/L (ref 135–145)
Sodium: 141 mmol/L (ref 135–145)
TCO2: 28 mmol/L (ref 22–32)
TCO2: 24 mmol/L (ref 22–32)
TCO2: 25 mmol/L (ref 22–32)
TCO2: 26 mmol/L (ref 22–32)
TCO2: 26 mmol/L (ref 22–32)
TCO2: 26 mmol/L (ref 22–32)

## 2022-05-21 LAB — ECHO INTRAOPERATIVE TEE
AR max vel: 0.63 cm2
AV Area VTI: 0.64 cm2
AV Area mean vel: 0.56 cm2
AV Mean grad: 17 mmHg
AV Peak grad: 25.3 mmHg
Ao pk vel: 2.51 m/s
Height: 62 in
S' Lateral: 1.5 cm
Weight: 2265.59 oz

## 2022-05-21 LAB — PREPARE RBC (CROSSMATCH)

## 2022-05-21 LAB — GLUCOSE, CAPILLARY
Glucose-Capillary: 110 mg/dL — ABNORMAL HIGH (ref 70–99)
Glucose-Capillary: 171 mg/dL — ABNORMAL HIGH (ref 70–99)
Glucose-Capillary: 144 mg/dL — ABNORMAL HIGH (ref 70–99)
Glucose-Capillary: 123 mg/dL — ABNORMAL HIGH (ref 70–99)
Glucose-Capillary: 116 mg/dL — ABNORMAL HIGH (ref 70–99)
Glucose-Capillary: 137 mg/dL — ABNORMAL HIGH (ref 70–99)
Glucose-Capillary: 140 mg/dL — ABNORMAL HIGH (ref 70–99)

## 2022-05-21 LAB — BASIC METABOLIC PANEL
Anion gap: 5 (ref 5–15)
BUN: 8 mg/dL (ref 6–20)
CO2: 24 mmol/L (ref 22–32)
Calcium: 7 mg/dL — ABNORMAL LOW (ref 8.9–10.3)
Chloride: 111 mmol/L (ref 98–111)
Creatinine, Ser: 0.54 mg/dL (ref 0.44–1.00)
GFR, Estimated: >60 mL/min (ref >60)
Glucose, Bld: 152 mg/dL — ABNORMAL HIGH (ref 70–99)
Potassium: 4.2 mmol/L (ref 3.5–5.1)
Sodium: 140 mmol/L (ref 135–145)

## 2022-05-21 LAB — HEMOGLOBIN AND HEMATOCRIT, BLOOD
HCT: 28.6 % — ABNORMAL LOW (ref 36.0–46.0)
Hemoglobin: 9.5 g/dL — ABNORMAL LOW (ref 12.0–15.0)

## 2022-05-21 LAB — PROTIME-INR
INR: 1.7 — ABNORMAL HIGH (ref 0.8–1.2)
Prothrombin Time: 19.8 seconds — ABNORMAL HIGH (ref 11.4–15.2)

## 2022-05-21 LAB — APTT: aPTT: 42 seconds — ABNORMAL HIGH (ref 24–36)

## 2022-05-21 LAB — FIBRINOGEN: Fibrinogen: 158 mg/dL — ABNORMAL LOW (ref 210–475)

## 2022-05-21 LAB — MAGNESIUM: Magnesium: 3.3 mg/dL — ABNORMAL HIGH (ref 1.7–2.4)

## 2022-05-21 LAB — PLATELET COUNT: Platelets: 157 10*3/uL (ref 150–400)

## 2022-05-21 SURGERY — REPLACEMENT, AORTIC VALVE, OPEN
Anesthesia: General | Site: Chest

## 2022-05-21 MED ORDER — DEXMEDETOMIDINE HCL IN NACL 400 MCG/100ML IV SOLN: 0.0000 ug/kg/h | INTRAVENOUS | Status: DC

## 2022-05-21 MED ORDER — LACTATED RINGERS IV SOLN: INTRAVENOUS | Status: DC

## 2022-05-21 MED ORDER — ROCURONIUM BROMIDE 10 MG/ML (PF) SYRINGE
PREFILLED_SYRINGE | INTRAVENOUS | Status: AC
  Filled 2022-05-21: qty 10

## 2022-05-21 MED ORDER — ARTIFICIAL TEARS OPHTHALMIC OINT
TOPICAL_OINTMENT | OPHTHALMIC | Status: AC
  Filled 2022-05-21: qty 3.5

## 2022-05-21 MED ORDER — FENTANYL CITRATE (PF) 250 MCG/5ML IJ SOLN
INTRAMUSCULAR | Status: AC
  Filled 2022-05-21: qty 5

## 2022-05-21 MED ORDER — SODIUM CHLORIDE 0.9% FLUSH: 3.0000 mL | INTRAVENOUS | Status: DC | PRN

## 2022-05-21 MED ORDER — HEPARIN SODIUM (PORCINE) 1000 UNIT/ML IJ SOLN
INTRAMUSCULAR | Status: AC
  Filled 2022-05-21: qty 1

## 2022-05-21 MED ORDER — ACETAMINOPHEN 500 MG PO TABS
1000.0000 mg | ORAL_TABLET | Freq: Once | ORAL | Status: AC
  Administered 2022-05-21: 1000 mg via ORAL
  Filled 2022-05-21: qty 2

## 2022-05-21 MED ORDER — ASPIRIN 325 MG PO TBEC
325.0000 mg | DELAYED_RELEASE_TABLET | Freq: Every day | ORAL | Status: DC
  Administered 2022-05-22 – 2022-05-25 (×4): 325 mg via ORAL
  Filled 2022-05-21 (×4): qty 1

## 2022-05-21 MED ORDER — BISACODYL 10 MG RE SUPP: 10.0000 mg | Freq: Every day | RECTAL | Status: DC

## 2022-05-21 MED ORDER — ORAL CARE MOUTH RINSE: 15.0000 mL | Freq: Once | OROMUCOSAL | Status: AC

## 2022-05-21 MED ORDER — SODIUM CHLORIDE 0.9% FLUSH
3.0000 mL | Freq: Two times a day (BID) | INTRAVENOUS | Status: DC
  Administered 2022-05-22 – 2022-05-25 (×8): 3 mL via INTRAVENOUS

## 2022-05-21 MED ORDER — NICARDIPINE HCL IN NACL 20-0.86 MG/200ML-% IV SOLN: 3.0000 mg/h | INTRAVENOUS | Status: DC

## 2022-05-21 MED ORDER — CHLORHEXIDINE GLUCONATE 4 % EX LIQD: 30.0000 mL | CUTANEOUS | Status: DC

## 2022-05-21 MED ORDER — NITROGLYCERIN IN D5W 200-5 MCG/ML-% IV SOLN: 0.0000 ug/min | INTRAVENOUS | Status: DC

## 2022-05-21 MED ORDER — ACETAMINOPHEN 500 MG PO TABS
1000.0000 mg | ORAL_TABLET | Freq: Four times a day (QID) | ORAL | Status: DC
  Administered 2022-05-21 – 2022-05-26 (×15): 1000 mg via ORAL
  Filled 2022-05-21 (×17): qty 2

## 2022-05-21 MED ORDER — HEPARIN SODIUM (PORCINE) 1000 UNIT/ML IJ SOLN
INTRAMUSCULAR | Status: DC | PRN
  Administered 2022-05-21: 22000 [IU] via INTRAVENOUS

## 2022-05-21 MED ORDER — LACTATED RINGERS IV SOLN: 500.0000 mL | Freq: Once | INTRAVENOUS | Status: DC | PRN

## 2022-05-21 MED ORDER — ACETAMINOPHEN 160 MG/5ML PO SOLN: 1000.0000 mg | Freq: Four times a day (QID) | ORAL | Status: DC

## 2022-05-21 MED ORDER — ~~LOC~~ CARDIAC SURGERY, PATIENT & FAMILY EDUCATION
Freq: Once | Status: DC
  Filled 2022-05-21: qty 1

## 2022-05-21 MED ORDER — SODIUM CHLORIDE 0.9 % IV SOLN: INTRAVENOUS | Status: DC | PRN

## 2022-05-21 MED ORDER — BISACODYL 5 MG PO TBEC
10.0000 mg | DELAYED_RELEASE_TABLET | Freq: Every day | ORAL | Status: DC
  Administered 2022-05-22: 10 mg via ORAL
  Filled 2022-05-21: qty 2

## 2022-05-21 MED ORDER — PROPOFOL 10 MG/ML IV BOLUS
INTRAVENOUS | Status: AC
  Filled 2022-05-21: qty 20

## 2022-05-21 MED ORDER — METHYLPREDNISOLONE SODIUM SUCC 125 MG IJ SOLR
INTRAMUSCULAR | Status: AC
  Filled 2022-05-21: qty 2

## 2022-05-21 MED ORDER — ACETAMINOPHEN 650 MG RE SUPP
650.0000 mg | Freq: Once | RECTAL | Status: AC
  Administered 2022-05-21: 650 mg via RECTAL

## 2022-05-21 MED ORDER — PANTOPRAZOLE SODIUM 40 MG PO TBEC
40.0000 mg | DELAYED_RELEASE_TABLET | Freq: Every day | ORAL | Status: DC
  Administered 2022-05-23 – 2022-05-28 (×6): 40 mg via ORAL
  Filled 2022-05-21 (×6): qty 1

## 2022-05-21 MED ORDER — MAGNESIUM SULFATE 4 GM/100ML IV SOLN
4.0000 g | Freq: Once | INTRAVENOUS | Status: AC
  Administered 2022-05-21: 4 g via INTRAVENOUS
  Filled 2022-05-21: qty 100

## 2022-05-21 MED ORDER — METOPROLOL TARTRATE 25 MG/10 ML ORAL SUSPENSION: 12.5000 mg | Freq: Two times a day (BID) | ORAL | Status: DC

## 2022-05-21 MED ORDER — VANCOMYCIN HCL IN DEXTROSE 1-5 GM/200ML-% IV SOLN
1000.0000 mg | Freq: Once | INTRAVENOUS | Status: AC
  Administered 2022-05-21: 1000 mg via INTRAVENOUS
  Filled 2022-05-21: qty 200

## 2022-05-21 MED ORDER — MIDAZOLAM HCL 2 MG/2ML IJ SOLN: 2.0000 mg | INTRAMUSCULAR | Status: DC | PRN

## 2022-05-21 MED ORDER — METHYLPREDNISOLONE SODIUM SUCC 125 MG IJ SOLR
INTRAMUSCULAR | Status: DC | PRN
  Administered 2022-05-21: 125 mg via INTRAVENOUS

## 2022-05-21 MED ORDER — 0.9 % SODIUM CHLORIDE (POUR BTL) OPTIME
TOPICAL | Status: DC | PRN
  Administered 2022-05-21: 5000 mL

## 2022-05-21 MED ORDER — PROTAMINE SULFATE 10 MG/ML IV SOLN
INTRAVENOUS | Status: AC
  Filled 2022-05-21: qty 25

## 2022-05-21 MED ORDER — FENTANYL CITRATE (PF) 250 MCG/5ML IJ SOLN
INTRAMUSCULAR | Status: DC | PRN
  Administered 2022-05-21 (×2): 100 ug via INTRAVENOUS
  Administered 2022-05-21: 50 ug via INTRAVENOUS
  Administered 2022-05-21 (×2): 150 ug via INTRAVENOUS
  Administered 2022-05-21: 50 ug via INTRAVENOUS
  Administered 2022-05-21 (×2): 100 ug via INTRAVENOUS
  Administered 2022-05-21: 50 ug via INTRAVENOUS
  Administered 2022-05-21: 100 ug via INTRAVENOUS
  Administered 2022-05-21: 150 ug via INTRAVENOUS

## 2022-05-21 MED ORDER — MIDAZOLAM HCL 5 MG/5ML IJ SOLN
INTRAMUSCULAR | Status: DC | PRN
  Administered 2022-05-21: 3 mg via INTRAVENOUS
  Administered 2022-05-21: 1 mg via INTRAVENOUS
  Administered 2022-05-21: 3 mg via INTRAVENOUS
  Administered 2022-05-21: 1 mg via INTRAVENOUS
  Administered 2022-05-21: 2 mg via INTRAVENOUS

## 2022-05-21 MED ORDER — SODIUM BICARBONATE 8.4 % IV SOLN
50.0000 meq | Freq: Once | INTRAVENOUS | Status: AC
  Administered 2022-05-21: 50 meq via INTRAVENOUS

## 2022-05-21 MED ORDER — CEFAZOLIN SODIUM-DEXTROSE 2-4 GM/100ML-% IV SOLN
2.0000 g | Freq: Three times a day (TID) | INTRAVENOUS | Status: AC
  Administered 2022-05-21 – 2022-05-23 (×6): 2 g via INTRAVENOUS
  Filled 2022-05-21 (×6): qty 100

## 2022-05-21 MED ORDER — THROMBIN 20000 UNITS EX SOLR
CUTANEOUS | Status: DC | PRN
  Administered 2022-05-21: 20000 [IU] via TOPICAL

## 2022-05-21 MED ORDER — METOPROLOL TARTRATE 12.5 MG HALF TABLET: 12.5000 mg | ORAL_TABLET | Freq: Two times a day (BID) | ORAL | Status: DC

## 2022-05-21 MED ORDER — CHLORHEXIDINE GLUCONATE 0.12 % MT SOLN: 15.0000 mL | Freq: Once | OROMUCOSAL | Status: DC

## 2022-05-21 MED ORDER — ASPIRIN 81 MG PO CHEW: 324.0000 mg | CHEWABLE_TABLET | Freq: Every day | ORAL | Status: DC

## 2022-05-21 MED ORDER — ONDANSETRON HCL 4 MG/2ML IJ SOLN
INTRAMUSCULAR | Status: AC
  Filled 2022-05-21: qty 2

## 2022-05-21 MED ORDER — TRAMADOL HCL 50 MG PO TABS: 50.0000 mg | ORAL_TABLET | ORAL | Status: DC | PRN

## 2022-05-21 MED ORDER — PHENYLEPHRINE HCL-NACL 20-0.9 MG/250ML-% IV SOLN
0.0000 ug/min | INTRAVENOUS | Status: DC
  Administered 2022-05-23: 10 ug/min via INTRAVENOUS

## 2022-05-21 MED ORDER — ACETAMINOPHEN 160 MG/5ML PO SOLN: 650.0000 mg | Freq: Once | ORAL | Status: AC

## 2022-05-21 MED ORDER — MIDAZOLAM HCL (PF) 10 MG/2ML IJ SOLN
INTRAMUSCULAR | Status: AC
  Filled 2022-05-21: qty 2

## 2022-05-21 MED ORDER — THROMBIN 20000 UNITS EX SOLR: OROMUCOSAL | Status: DC | PRN

## 2022-05-21 MED ORDER — LACTATED RINGERS IV SOLN: INTRAVENOUS | Status: DC | PRN

## 2022-05-21 MED ORDER — THROMBIN (RECOMBINANT) 20000 UNITS EX SOLR
CUTANEOUS | Status: AC
  Filled 2022-05-21: qty 20000

## 2022-05-21 MED ORDER — METOPROLOL TARTRATE 5 MG/5ML IV SOLN: 2.5000 mg | INTRAVENOUS | Status: DC | PRN

## 2022-05-21 MED ORDER — SODIUM CHLORIDE 0.9 % IV SOLN: 250.0000 mL | INTRAVENOUS | Status: DC

## 2022-05-21 MED ORDER — SODIUM CHLORIDE 0.9% IV SOLUTION: Freq: Once | INTRAVENOUS | Status: AC

## 2022-05-21 MED ORDER — PROTAMINE SULFATE 10 MG/ML IV SOLN
INTRAVENOUS | Status: DC | PRN
  Administered 2022-05-21: 20 mg via INTRAVENOUS
  Administered 2022-05-21: 200 mg via INTRAVENOUS

## 2022-05-21 MED ORDER — DOCUSATE SODIUM 100 MG PO CAPS
200.0000 mg | ORAL_CAPSULE | Freq: Every day | ORAL | Status: DC
  Administered 2022-05-22: 200 mg via ORAL
  Filled 2022-05-21: qty 2

## 2022-05-21 MED ORDER — SODIUM CHLORIDE 0.9 % IV SOLN: INTRAVENOUS | Status: DC

## 2022-05-21 MED ORDER — ALBUMIN HUMAN 5 % IV SOLN
250.0000 mL | INTRAVENOUS | Status: AC | PRN
  Administered 2022-05-21 (×3): 12.5 g via INTRAVENOUS
  Filled 2022-05-21: qty 250

## 2022-05-21 MED ORDER — PLASMA-LYTE A IV SOLN
INTRAVENOUS | Status: DC | PRN
  Administered 2022-05-21: 500 mL

## 2022-05-21 MED ORDER — HEMOSTATIC AGENTS (NO CHARGE) OPTIME
TOPICAL | Status: DC | PRN
  Administered 2022-05-21 (×2): 1 via TOPICAL

## 2022-05-21 MED ORDER — MORPHINE SULFATE (PF) 2 MG/ML IV SOLN
1.0000 mg | INTRAVENOUS | Status: DC | PRN
  Administered 2022-05-21: 1 mg via INTRAVENOUS
  Filled 2022-05-21: qty 1

## 2022-05-21 MED ORDER — ROCURONIUM BROMIDE 10 MG/ML (PF) SYRINGE
PREFILLED_SYRINGE | INTRAVENOUS | Status: DC | PRN
  Administered 2022-05-21: 100 mg via INTRAVENOUS
  Administered 2022-05-21 (×2): 50 mg via INTRAVENOUS

## 2022-05-21 MED ORDER — FAMOTIDINE IN NACL 20-0.9 MG/50ML-% IV SOLN
20.0000 mg | Freq: Two times a day (BID) | INTRAVENOUS | Status: AC
  Administered 2022-05-21 (×2): 20 mg via INTRAVENOUS
  Filled 2022-05-21 (×2): qty 50

## 2022-05-21 MED ORDER — CHLORHEXIDINE GLUCONATE 0.12 % MT SOLN
15.0000 mL | Freq: Once | OROMUCOSAL | Status: AC
  Administered 2022-05-21: 15 mL via OROMUCOSAL
  Filled 2022-05-21: qty 15

## 2022-05-21 MED ORDER — ONDANSETRON HCL 4 MG/2ML IJ SOLN
4.0000 mg | Freq: Four times a day (QID) | INTRAMUSCULAR | Status: DC | PRN
  Administered 2022-05-21 – 2022-05-26 (×4): 4 mg via INTRAVENOUS
  Filled 2022-05-21 (×4): qty 2

## 2022-05-21 MED ORDER — NICARDIPINE HCL IN NACL 20-0.86 MG/200ML-% IV SOLN
INTRAVENOUS | Status: AC
  Filled 2022-05-21: qty 200

## 2022-05-21 MED ORDER — SUCCINYLCHOLINE CHLORIDE 200 MG/10ML IV SOSY
PREFILLED_SYRINGE | INTRAVENOUS | Status: AC
  Filled 2022-05-21: qty 10

## 2022-05-21 MED ORDER — CHLORHEXIDINE GLUCONATE 0.12 % MT SOLN
15.0000 mL | OROMUCOSAL | Status: AC
  Administered 2022-05-21: 15 mL via OROMUCOSAL
  Filled 2022-05-21: qty 15

## 2022-05-21 MED ORDER — METOPROLOL TARTRATE 12.5 MG HALF TABLET
12.5000 mg | ORAL_TABLET | Freq: Once | ORAL | Status: AC
  Administered 2022-05-21: 12.5 mg via ORAL
  Filled 2022-05-21: qty 1

## 2022-05-21 MED ORDER — OXYCODONE HCL 5 MG PO TABS
5.0000 mg | ORAL_TABLET | ORAL | Status: DC | PRN
  Administered 2022-05-21 – 2022-05-28 (×11): 5 mg via ORAL
  Filled 2022-05-21 (×13): qty 1

## 2022-05-21 MED ORDER — PROPOFOL 10 MG/ML IV BOLUS
INTRAVENOUS | Status: DC | PRN
  Administered 2022-05-21 (×2): 70 mg via INTRAVENOUS
  Administered 2022-05-21: 20 mg via INTRAVENOUS
  Administered 2022-05-21: 50 mg via INTRAVENOUS

## 2022-05-21 MED ORDER — LIDOCAINE 2% (20 MG/ML) 5 ML SYRINGE
INTRAMUSCULAR | Status: AC
  Filled 2022-05-21: qty 5

## 2022-05-21 MED ORDER — DEXTROSE 50 % IV SOLN: 0.0000 mL | INTRAVENOUS | Status: DC | PRN

## 2022-05-21 MED ORDER — POTASSIUM CHLORIDE 10 MEQ/50ML IV SOLN
10.0000 meq | INTRAVENOUS | Status: AC
  Administered 2022-05-21 (×3): 10 meq via INTRAVENOUS

## 2022-05-21 MED ORDER — ONDANSETRON HCL 4 MG/2ML IJ SOLN
INTRAMUSCULAR | Status: DC | PRN
  Administered 2022-05-21: 4 mg via INTRAVENOUS

## 2022-05-21 MED ORDER — INSULIN REGULAR(HUMAN) IN NACL 100-0.9 UT/100ML-% IV SOLN: INTRAVENOUS | Status: DC

## 2022-05-21 MED ORDER — SODIUM CHLORIDE 0.9% IV SOLUTION: Freq: Once | INTRAVENOUS | Status: DC

## 2022-05-21 MED ORDER — SODIUM CHLORIDE 0.45 % IV SOLN: INTRAVENOUS | Status: DC | PRN

## 2022-05-21 SURGICAL SUPPLY — 98 items
ADAPTER CARDIO PERF ANTE/RETRO (ADAPTER) ×2 IMPLANT
ADPR CRDPLG 7.5 .25D 1 LRG Y (ADAPTER) ×2
ADPR PRFSN 84XANTGRD RTRGD (ADAPTER) ×2
APL SRG 7X2 LUM MLBL SLNT (VASCULAR PRODUCTS)
APPLICATOR TIP COSEAL (VASCULAR PRODUCTS) IMPLANT
BAG DECANTER FOR FLEXI CONT (MISCELLANEOUS) ×2 IMPLANT
BLADE CLIPPER SURG (BLADE) ×2 IMPLANT
BLADE STERNUM SYSTEM 6 (BLADE) ×2 IMPLANT
BLADE SURG 15 STRL LF DISP TIS (BLADE) ×2 IMPLANT
BLADE SURG 15 STRL SS (BLADE) ×2
CANISTER SUCT 3000ML PPV (MISCELLANEOUS) ×2 IMPLANT
CANNULA CARDIOPLEGIA 14FRX32 (CANNULA) IMPLANT
CANNULA GUNDRY RCSP 15FR (MISCELLANEOUS) ×2 IMPLANT
CANNULA MC2 2 STG 36/46 NON-V (CANNULA) IMPLANT
CANNULA SUMP PERICARDIAL (CANNULA) IMPLANT
CANNULA VENOUS 2 STG 34/46 (CANNULA) ×2
CATH CPB KIT GERHARDT (MISCELLANEOUS) IMPLANT
CATH HEART VENT LEFT (CATHETERS) ×2 IMPLANT
CATH ROBINSON RED A/P 18FR (CATHETERS) ×6 IMPLANT
CATH THORACIC 28FR (CATHETERS) IMPLANT
CATH THORACIC 36FR (CATHETERS) ×2 IMPLANT
CATH THORACIC 36FR RT ANG (CATHETERS) ×2 IMPLANT
CNTNR URN SCR LID CUP LEK RST (MISCELLANEOUS) ×2 IMPLANT
CONT SPEC 4OZ STRL OR WHT (MISCELLANEOUS) ×4
CONTAINER PROTECT SURGISLUSH (MISCELLANEOUS) ×4 IMPLANT
COVER SURGICAL LIGHT HANDLE (MISCELLANEOUS) ×2 IMPLANT
DEVICE SUT CK QUICK LOAD INDV (Prosthesis & Implant Heart) IMPLANT
DRAPE CARDIOVASCULAR INCISE (DRAPES) ×2
DRAPE SRG 135X102X78XABS (DRAPES) ×2 IMPLANT
DRAPE WARM FLUID 44X44 (DRAPES) ×2 IMPLANT
DRSG COVADERM 4X14 (GAUZE/BANDAGES/DRESSINGS) ×2 IMPLANT
ELECT CAUTERY BLADE 6.4 (BLADE) ×2 IMPLANT
ELECT REM PT RETURN 9FT ADLT (ELECTROSURGICAL) ×4
ELECTRODE REM PT RTRN 9FT ADLT (ELECTROSURGICAL) ×4 IMPLANT
FELT TEFLON 1X6 (MISCELLANEOUS) ×4 IMPLANT
GAUZE 4X4 16PLY ~~LOC~~+RFID DBL (SPONGE) ×2 IMPLANT
GAUZE SPONGE 4X4 12PLY STRL (GAUZE/BANDAGES/DRESSINGS) ×2 IMPLANT
GLOVE BIO SURGEON STRL SZ 6 (GLOVE) IMPLANT
GLOVE BIO SURGEON STRL SZ 6.5 (GLOVE) IMPLANT
GLOVE BIO SURGEON STRL SZ7 (GLOVE) IMPLANT
GLOVE BIO SURGEON STRL SZ7.5 (GLOVE) IMPLANT
GLOVE SURG MICRO LTX SZ7 (GLOVE) ×4 IMPLANT
GOWN STRL REUS W/ TWL LRG LVL3 (GOWN DISPOSABLE) ×8 IMPLANT
GOWN STRL REUS W/ TWL XL LVL3 (GOWN DISPOSABLE) ×2 IMPLANT
GOWN STRL REUS W/TWL LRG LVL3 (GOWN DISPOSABLE) ×8
GOWN STRL REUS W/TWL XL LVL3 (GOWN DISPOSABLE) ×2
GRAFT HEMASHIELD 26X10MM (Vascular Products) IMPLANT
GRAFT WOVEN D/V 24DX30L (Vascular Products) IMPLANT
HEMOSTAT POWDER SURGIFOAM 1G (HEMOSTASIS) ×6 IMPLANT
HEMOSTAT SURGICEL 2X14 (HEMOSTASIS) ×2 IMPLANT
KIT BASIN OR (CUSTOM PROCEDURE TRAY) ×2 IMPLANT
KIT CATH CPB BARTLE (MISCELLANEOUS) ×2 IMPLANT
KIT SUCTION CATH 14FR (SUCTIONS) ×2 IMPLANT
KIT SUT CK MINI COMBO 4X17 (Prosthesis & Implant Heart) IMPLANT
KIT TURNOVER KIT B (KITS) ×2 IMPLANT
LINE VENT (MISCELLANEOUS) IMPLANT
NS IRRIG 1000ML POUR BTL (IV SOLUTION) ×12 IMPLANT
PACK E OPEN HEART (SUTURE) ×2 IMPLANT
PACK OPEN HEART (CUSTOM PROCEDURE TRAY) ×2 IMPLANT
PAD ARMBOARD 7.5X6 YLW CONV (MISCELLANEOUS) ×4 IMPLANT
PAD CARDIAC INSULATION (MISCELLANEOUS) IMPLANT
POSITIONER HEAD DONUT 9IN (MISCELLANEOUS) ×2 IMPLANT
SEALANT SURG COSEAL 8ML (VASCULAR PRODUCTS) IMPLANT
SET MPS 3-ND DEL (MISCELLANEOUS) IMPLANT
SET VEIN GRAFT PERF (SET/KITS/TRAYS/PACK) IMPLANT
SET Y-VENT ADPTR 7.5 MALE LUER (ADAPTER) IMPLANT
SPONGE T-LAP 18X18 ~~LOC~~+RFID (SPONGE) ×8 IMPLANT
SPONGE T-LAP 4X18 ~~LOC~~+RFID (SPONGE) ×2 IMPLANT
SUT BONE WAX W31G (SUTURE) ×2 IMPLANT
SUT EB EXC GRN/WHT 2-0 V-5 (SUTURE) ×4 IMPLANT
SUT ETHIBON EXCEL 2-0 V-5 (SUTURE) IMPLANT
SUT ETHIBOND V-5 VALVE (SUTURE) IMPLANT
SUT PROLENE 3 0 SH 1 (SUTURE) ×2 IMPLANT
SUT PROLENE 3 0 SH 48 (SUTURE) ×4 IMPLANT
SUT PROLENE 3 0 SH DA (SUTURE) IMPLANT
SUT PROLENE 3 0 SH1 36 (SUTURE) ×2 IMPLANT
SUT PROLENE 4 0 RB 1 (SUTURE) ×8
SUT PROLENE 4-0 RB1 .5 CRCL 36 (SUTURE) ×8 IMPLANT
SUT SILK 2 0 SH CR/8 (SUTURE) IMPLANT
SUT STEEL 6MS V (SUTURE) IMPLANT
SUT STEEL STERNAL CCS#1 18IN (SUTURE) IMPLANT
SUT STEEL SZ 6 DBL 3X14 BALL (SUTURE) IMPLANT
SUT VIC AB 1 CTX 36 (SUTURE) ×6
SUT VIC AB 1 CTX36XBRD ANBCTR (SUTURE) ×4 IMPLANT
SUT VIC AB 2-0 CT1 27 (SUTURE)
SUT VIC AB 2-0 CT1 TAPERPNT 27 (SUTURE) IMPLANT
SUT VIC AB 3-0 X1 27 (SUTURE) IMPLANT
SYSTEM SAHARA CHEST DRAIN ATS (WOUND CARE) ×2 IMPLANT
TAPE CLOTH SURG 4X10 WHT LF (GAUZE/BANDAGES/DRESSINGS) IMPLANT
TAPE PAPER 3X10 WHT MICROPORE (GAUZE/BANDAGES/DRESSINGS) IMPLANT
TOWEL GREEN STERILE (TOWEL DISPOSABLE) ×2 IMPLANT
TOWEL GREEN STERILE FF (TOWEL DISPOSABLE) ×2 IMPLANT
TRAY FOLEY SLVR 14FR TEMP STAT (SET/KITS/TRAYS/PACK) ×2 IMPLANT
TRAY FOLEY SLVR 16FR TEMP STAT (SET/KITS/TRAYS/PACK) ×2 IMPLANT
UNDERPAD 30X36 HEAVY ABSORB (UNDERPADS AND DIAPERS) ×2 IMPLANT
VALVE AORTIC SZ21 INSP/RESIL (Valve) IMPLANT
VENT LEFT HEART 12002 (CATHETERS) ×2
WATER STERILE IRR 1000ML POUR (IV SOLUTION) ×4 IMPLANT

## 2022-05-21 NOTE — Hospital Course (Addendum)
PCP is Copland, Gay Filler, MD Referring Provider is [name], MD Primary Cardiologist is [name], MD  History of Present Illness: This 51 year old woman has stage D, severe, symptomatic aortic stenosis with New York Heart Association class II symptoms of exertional fatigue and shortness of breath as well as exertional chest tightness consistent with chronic diastolic congestive heart failure.  Her echocardiogram shows an indeterminate number of of aortic valve leaflets with a mean gradient across the aortic valve that has increased to 40 mmHg and a valve area of 0.51 cm consistent with severe aortic stenosis.  Her left ventricular systolic function is normal.  Recent CTA of the chest shows her ascending aorta is 2 times the size of her descending aorta, although only 3.8 cm.  It has increased from 3.0 cm in 2008.  The aortic root appears to be of normal size.  I think the best treatment for her is aortic valve replacement with supra-coronary replacement of the ascending aorta up to the proximal aortic arch.  I reviewed the CTA images with her and answered her questions.  She has a long history of iron deficiency anemia of unclear etiology and has been treated by hematology.  I think it would be best to use a bioprosthetic valve in her case even though she is 51 years old.  If she had a mechanical valve with lifelong need for Coumadin it could be a disaster if she continued to have iron deficiency anemia due to blood loss in her GI tract or elsewhere.  The durability of the Edwards Inspiris Resilia pericardial valve is expected to be 20+ years and I think the benefit of not having to be on Coumadin for her outweighs the risk of structural valve deterioration.  I discussed this with her and she is in agreement. I discussed the operative procedure with the patient  including alternatives, benefits and risks; including but not limited to bleeding, blood transfusion, infection, stroke, myocardial  infarction, graft failure, heart block requiring a permanent pacemaker, organ dysfunction, and death.  [employer] understands and agrees to proceed.   Hospital Course: Ms. Miklos was admitted for elective surgery on 05/21/22 and taken to the OR where the aortic valve was replaced with a 58m Inspiris bioprosthetic valve and the ascending aorta was replaced with 28mand 2684memashield grafts. Following the procedure, she separarted from cardiopulmonary bypass without any inotropic agents. She was transferred to the ICU in stable condition.   The patient was extubated the evening of surgery.  She had issue with PONV felt to be due to Morphine.  She was transitioned to Fentanyl for pain control.  She was started on diuretics for volume overloaded state.  She was not started on a BB due to hypotension.  She was weaned off Neo as hemodynamics allowed.  She was hyperglycemic due to intraoperative steroids and treated with SSIP.  Her chest tubes were removed without difficulty.  Her blood sugars improved and SSIP was discontinued. She was noted to have a moderately sized left sided pneumothorax on CXR, follow up CXR showed a reduction in pneumothorax size. She continued to saturate 95-100% on 1L [place] of O2. Pneumothorax remained stable and she was weaned off oxygen. She was felt stable for transfer to progressive unit.

## 2022-05-21 NOTE — Anesthesia Postprocedure Evaluation (Signed)
Anesthesia Post Note  Patient: Wilbur J Kibler  Procedure(s) Performed: AORTIC VALVE REPLACEMENT (AVR) USING A 21MM INSPIRIS TISSUE VALVE (Chest) REPLACEMENT ASCENDING AORTA USING A 26 X 10MM HEMASHIELD AND AND A 24MM X 30CM HEMASHEILD GRAFT TRANSESOPHAGEAL ECHOCARDIOGRAM (TEE)     Patient location during evaluation: SICU Anesthesia Type: General Level of consciousness: sedated Pain management: pain level controlled Vital Signs Assessment: post-procedure vital signs reviewed and stable Respiratory status: patient remains intubated per anesthesia plan Cardiovascular status: stable Postop Assessment: no apparent nausea or vomiting Anesthetic complications: no   No notable events documented.  Last Vitals:  Vitals:   05/21/22 1530 05/21/22 1546  BP:  101/65  Pulse: 80 80  Resp: 14 16  Temp: (!) 35.7 C   SpO2: 100% 100%    Last Pain:  Vitals:   05/21/22 1400  TempSrc: Core  PainSc:                  Conley Delisle,W. EDMOND

## 2022-05-21 NOTE — Anesthesia Procedure Notes (Signed)
Central Venous Catheter Insertion Performed by: Roderic Palau, MD, anesthesiologist Start/End10/05/2022 6:55 AM, 05/21/2022 7:10 AM Patient location: Pre-op. Preanesthetic checklist: patient identified, IV checked, site marked, risks and benefits discussed, surgical consent, monitors and equipment checked, pre-op evaluation, timeout performed and anesthesia consent Hand hygiene performed  and maximum sterile barriers used  PA cath was placed.Swan type:thermodilution PA Cath depth:50 Procedure performed without using ultrasound guided technique. Attempts: 1 Patient tolerated the procedure well with no immediate complications.

## 2022-05-21 NOTE — Op Note (Signed)
CARDIOVASCULAR SURGERY OPERATIVE NOTE  05/21/2022  Surgeon:  [name], MD  First Assistant: Enid Cutter,  PA-C: An experienced assistant was required given the complexity of this surgery and the standard of surgical care. The assistant was needed for exposure, dissection, suctioning, retraction of delicate tissues and sutures, instrument exchange and for overall help during this procedure.    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   Postoperative Diagnosis:  Same   Procedure:  Median Sternotomy Extracorporeal circulation 3.   Supra-coronary replacement of the ascending aorta (hemi-arch) using a 26 mm Hemashield graft under deep hypothermic circulatory arrest and 24 mm interposition graft. 4.   Aortic valve replacement using a 21 mm Edwards INSPIRIS RESILIA pericardial valve.  Anesthesia:  General Endotracheal   Clinical History/Surgical Indication:    This 51 year old woman has stage D, severe, symptomatic aortic stenosis with New York Heart Association class II symptoms of exertional fatigue and shortness of breath as well as exertional chest tightness consistent with chronic diastolic congestive heart failure.  Her echocardiogram shows an indeterminate number of of aortic valve leaflets with a mean gradient across the aortic valve that has increased to 40 mmHg and a valve area of 0.51 cm consistent with severe aortic stenosis.  Her left ventricular systolic function is normal.  Recent CTA of the chest shows her ascending aorta is 2 times the size of her descending aorta, although only 3.8 cm.  It has increased from 3.0 cm in 2008.  The aortic root appears to be of normal size.  I think the best treatment for her is aortic valve replacement with supra-coronary replacement of the ascending aorta up to the proximal aortic arch.  I reviewed the CTA images with her and answered her questions.  She has a long history of iron  deficiency anemia of unclear etiology and has been treated by hematology.  I think it would be best to use a bioprosthetic valve in her case even though she is 51 years old.  If she had a mechanical valve with lifelong need for Coumadin it could be a disaster if she continued to have iron deficiency anemia due to blood loss in her GI tract or elsewhere.  The durability of the Edwards Inspiris Resilia pericardial valve is expected to be 20+ years and I think the benefit of not having to be on Coumadin for her outweighs the risk of structural valve deterioration.  I discussed this with her and she is in agreement. I discussed the operative procedure with the patient  including alternatives, benefits and risks; including but not limited to bleeding, blood transfusion, infection, stroke, myocardial infarction, graft failure, heart block requiring a permanent pacemaker, organ dysfunction, and death.  [employer] understands and agrees to proceed.      Preparation:  The patient was seen in the preoperative holding area and the correct patient, correct operation were confirmed with the patient after reviewing the medical record and catheterization. The consent was signed by me. Preoperative antibiotics were given. A pulmonary arterial line and radial arterial line were placed by the anesthesia team. The patient was taken back to the operating room and positioned supine on the operating room table. After being placed under general endotracheal anesthesia by the anesthesia team a foley catheter was placed. The neck, chest, abdomen, and both legs were prepped with betadine soap and solution and draped in the usual sterile manner. A surgical time-out was taken and the correct patient and  operative procedure were confirmed with the nursing and anesthesia staff.  TEE:  Performed by Dr. Suann Larry. This showed severe bicuspid aortic valve stenosis with a mean gradient over 40 mm Hg, normal LV systolic  function.   Cardiopulmonary Bypass:  A median sternotomy was performed. The pericardium was opened in the midline. Right ventricular function appeared normal. The ascending aorta was enlarged from the sinotubular junction to almost the innominate artery and had no palpable plaque. The diameter by CTA was only 3.8 cm but the descending aorta was only 19 mm and the ascending aortic diameter had progressively increased from 3.0 cm in 2008. There were no contraindications to aortic cannulation or cross-clamping. The patient was fully systemically heparinized and the ACT was maintained > 400 sec. The distal ascending aorta was cannulated with a 20 F aortic cannula for arterial inflow. Venous cannulation was performed via the right atrial appendage using a two-staged venous cannula. A temperature probe was inserted into the interventricular septum and an insulating pad was placed in the pericardium. CO2 was insufflated into the pericardium throughout the case to minimize intracardiac air.    Resection and grafting of ascending aortic aneurysm:  The patient was placed on cardiopulmonary bypass and a left ventricular vent was placed via the right superior pulmonary vein. Systemic cooling was begun with a goal temperature of 18 degrees centigrade by bladder and rectal temperature probes. A retrograde cardioplegia cannula was placed through the right atrium into the coronary sinus without difficulty. A retrograde cerebral perfusion cannula was placed into the SVC through a pursestring suture and the SVC was encircled with a silastic tape. After 30 minutes of cooling the target temperature of 18 degrees centigrade was reached. Cerebral oximetry was unchanged bilaterally. BIS was zero. The patient was given Propofol and 125 mg of Solumedrol. The head was packed in ice. The bed was placed in steep trendelenburg. Circulatory arrest was begun and the blood volume emptied into the venous reservoir. Cold retrograde KBC  cardioplegia was given and myocardial temperature dropped to 10 degrees centigrade. Additional doses were given at approximately 60 minute intervals throughout the period of circulatory arrest and cross-clamping. Continuous retrograde cerebral perfusion was begun when the cardioplegia completed and the SVC occluded with the silastic tape. Complete diastolic arrest was maintained. The aortic cannula was removed. The aorta was transected just proximal to the innominate artery beveling the resection out along the undersurface of the aortic arch (Hemiarch replacement). The aortic diameter was measured at 26 mm here. A 26 x 10 mm Hemasheild Platinum vascular graft was prepared. ( REF # [id number] P0, Lot [id number], SN [id number]). It was anastomosed to the aortic arch in an end to end manner using 3-0 prolene continuous suture with a felt strip to reinforce the anastomisis. A light coating of CoSeal was applied to seal needle holes. The arterial end of the bypass circuit was then connected to the 19m side arm graft and circulation was slowly resumed. The tape was removed from the SVC. The aortic graft was cross-clamped proximal to the side arm graft and full CPB support was resumed. Circulatory arrest time was 19 minutes. Retrograde cerebral perfusion time was 9 minutes.   [place] Procedure:   The ascending aorta was mobilized from the right pulmonary artery and main PA. It was opened longitudinally and the valve inspected. It was a bicuspid valve with fusion of the right and non cusps with 2 commissures. The aortic sinuses were small. The native valve was excised taking care to remove  all particulate debri. The annulus was decalcified with rongeurs. The annulus was sized and a 21 mm Edwards INSPIRIS RESILIA pericardial valve was chosen. ( Model # [id number], Serial # [id number]). A series of small pledgett 2-0 Ethibond horizontal mattress sutures were placed around the annulus with the pledgets in a sub-annular position.   The sutures were placed through the valve sewing ring. The valve was lowered into place and the sutures tied.  The coronary ostia were examined and not obstructed.  Then a 24 mm Hemashield graft was anastomosed end to end to the supra-coronary aorta using continuous 3-0 prolene suture with a felt strip for reinforcement. CoSeal was applied to seal the needle holes in the grafts. Then the interposition graft and the ascending aortic graft were cut to the appropriate lengths and anastomosed end to end using continuous 3-0 Prolene suture. Coseal was applied to seal needle holes. A vent cannula was placed into the graft to remove any air. Deairing maneuvers were performed and the bed placed in trendelenburg position.   Completion:   The patient was rewarmed to 37 degrees Centigrade. The crossclamp was removed with a time of 114 minutes. There was spontaneous return of sinus rhythm. The position of the grafts was satisfactory. The vascular anastomoses all appeared hemostatic. Two temporary epicardial pacing wires were placed on the right atrium and two on the right ventricle. The patient was weaned from CPB without difficulty on no inotropic agents. CPB time was 173 minutes. Cardiac output was 5 LPM. TEE showed a normal functioning aortic valve prosthesis with no AI. LV function appeared normal. Heparin was fully reversed with protamine and the  venous cannula removed. The aortic sidearm graft was suture ligated with a #1 silk tie and suture ligated with a pledgetted 3-0 prolene horizontal mattress suture. Hemostasis was achieved. Mediastinal and right pleural drainage tubes were placed. The sternum was closed with  #6 stainless steel wires. The fascia was closed with continuous # 1 vicryl suture. The subcutaneous tissue was closed with 2-0 vicryl continuous suture. The skin was closed with 3-0 vicryl subcuticular suture. All sponge, needle, and instrument counts were reported correct at the end of the case. Dry  sterile dressings were placed over the incisions and around the chest tubes which were connected to pleurevac suction. The patient was then transported to the surgical intensive care unit in stable condition.

## 2022-05-21 NOTE — Procedures (Signed)
Extubation Procedure Note  Patient Details:   Name: Amy Terry DOB: August 16, 1970 MRN: [id number]   Airway Documentation:    Vent end date: 05/21/22 Vent end time: 1838   Evaluation  O2 sats: stable throughout Complications: No apparent complications Patient did tolerate procedure well. Bilateral Breath Sounds: Clear   Yes  Patient extubated to 4L [place].  Positive cuff leak noted.  No evidence of stridor.  NIF of -22, VC of 1.1L.  Patient able to speak post extubation.  Sats and vitals are currently stable at this time.   Judith Part 05/21/2022, 6:41 PM

## 2022-05-21 NOTE — Anesthesia Procedure Notes (Signed)
Procedure Name: Intubation Date/Time: 05/21/2022 7:44 AM  Performed by: Harden Mo, CRNAPre-anesthesia Checklist: Patient identified, Emergency Drugs available, Suction available and Patient being monitored Patient Re-evaluated:Patient Re-evaluated prior to induction Oxygen Delivery Method: Circle System Utilized Preoxygenation: Pre-oxygenation with 100% oxygen Induction Type: IV induction Ventilation: Mask ventilation without difficulty Laryngoscope Size: Miller and 2 Grade View: Grade I Tube type: Oral Tube size: 8.0 mm Number of attempts: 1 Airway Equipment and Method: Stylet and Oral airway Placement Confirmation: ETT inserted through vocal cords under direct vision, positive ETCO2 and breath sounds checked- equal and bilateral Secured at: 22 cm Tube secured with: Tape Dental Injury: Teeth and Oropharynx as per pre-operative assessment

## 2022-05-21 NOTE — Anesthesia Procedure Notes (Signed)
Central Venous Catheter Insertion Performed by: Roderic Palau, MD, anesthesiologist Start/End10/05/2022 6:55 AM, 05/21/2022 7:10 AM Patient location: Pre-op. Preanesthetic checklist: patient identified, IV checked, site marked, risks and benefits discussed, surgical consent, monitors and equipment checked, pre-op evaluation, timeout performed and anesthesia consent Position: Trendelenburg Lidocaine 1% used for infiltration and patient sedated Hand hygiene performed , maximum sterile barriers used  and Seldinger technique used Catheter size: 8.5 Fr Total catheter length 10. Central line was placed.MAC introducer Procedure performed using ultrasound guided technique. Ultrasound Notes:anatomy identified, needle tip was noted to be adjacent to the nerve/plexus identified, no ultrasound evidence of intravascular and/or intraneural injection and image(s) printed for medical record Attempts: 1 Following insertion, line sutured, dressing applied and Biopatch. Post procedure assessment: blood return through all ports, free fluid flow and no air  Patient tolerated the procedure well with no immediate complications.

## 2022-05-21 NOTE — Transfer of Care (Signed)
Immediate Anesthesia Transfer of Care Note  Patient: Amy Terry  Procedure(s) Performed: AORTIC VALVE REPLACEMENT (AVR) USING A 21MM INSPIRIS TISSUE VALVE (Chest) REPLACEMENT ASCENDING AORTA USING A 26 X 10MM HEMASHIELD AND AND A 24MM X 30CM HEMASHEILD GRAFT TRANSESOPHAGEAL ECHOCARDIOGRAM (TEE)  Patient Location: ICU  Anesthesia Type:General  Level of Consciousness: sedated, unresponsive and Patient remains intubated per anesthesia plan  Airway & Oxygen Therapy: Patient remains intubated per anesthesia plan and Patient placed on Ventilator (see vital sign flow sheet for setting)  Post-op Assessment: Report given to RN and Post -op Vital signs reviewed and stable  Post vital signs: Reviewed and stable  Last Vitals:  Vitals Value Taken Time  BP 144/112 05/21/22 1329  Temp 35.2 C 05/21/22 1338  Pulse 80 05/21/22 1338  Resp 12 05/21/22 1338  SpO2 100 % 05/21/22 1338  Vitals shown include unvalidated device data.  Last Pain:  Vitals:   05/21/22 0622  TempSrc: Oral  PainSc: 0-No pain         Complications: No notable events documented.

## 2022-05-21 NOTE — Brief Op Note (Addendum)
05/21/2022  12:20 PM  PATIENT:  Amy Terry  51 y.o. female  PRE-OPERATIVE DIAGNOSIS:  Aortic Stenosis, Dilation of the Ascending Aorta  POST-OPERATIVE DIAGNOSIS:  Aortic Stenosis, Dilation of the Ascending Aorta  PROCEDURE: AORTIC VALVE REPLACEMENT (AVR) USING A 21MM INSPIRIS TISSUE VALVE (N/A) REPLACEMENT ASCENDING AORTA USING A 26 X 10MM HEMASHIELD AND AND A 24MM X 30CM HEMASHEILD GRAFT (N/A) TRANSESOPHAGEAL ECHOCARDIOGRAM (TEE) (N/A)  SURGEON:  [name], MD - Primary  PHYSICIAN ASSISTANT: Chukwuebuka Churchill  ASSISTANTS: Page, Lauren, RN, Scrub Person  Rhoads, Carlena Bjornstad, RN, RN First Assistant   ANESTHESIA:   general  EBL:  131m  BLOOD ADMINISTERED:  Cryoprecipitate  DRAINS:  mediastinal Blake drain    LOCAL MEDICATIONS USED:  NONE  SPECIMEN:  Sclerotic aortic valve leaflets  DISPOSITION OF SPECIMEN:  PATHOLOGY  COUNTS:  YES  DICTATION: .Dragon Dictation  PLAN OF CARE: Admit to inpatient   PATIENT DISPOSITION:  ICU - Intubated and hemodynamically stable.   Delay start of Pharmacological VTE agent (>24hrs) due to surgical blood loss or risk of bleeding: yes

## 2022-05-21 NOTE — Progress Notes (Signed)
Patient ID: Amy Terry, female   DOB: August 14, 1970, 51 y.o.   MRN: [id number]  TCTS Evening Rounds:   Hemodynamically stable  CI = 1.7  Still asleep on vent  Urine output good  CT output low CXR ok ECG: sinus, no acute changes  CBC    Component Value Date/Time   WBC 12.2 (H) 05/21/2022 1356   RBC 4.12 05/21/2022 1356   HGB 11.9 (L) 05/21/2022 1356   HGB 12.0 01/25/2022 0914   HCT 36.8 05/21/2022 1356   HCT 39.5 01/25/2022 0914   PLT 118 (L) 05/21/2022 1356   PLT 300 01/25/2022 0914   MCV 89.3 05/21/2022 1356   MCV 76 (L) 01/25/2022 0914   MCH 28.9 05/21/2022 1356   MCHC 32.3 05/21/2022 1356   RDW 14.4 05/21/2022 1356   RDW 16.8 (H) 06/04/2018 0856   LYMPHSABS 1.9 01/25/2022 0914   MONOABS 0.3 12/31/2021 1054   EOSABS 0.1 01/25/2022 0914   BASOSABS 0.1 01/25/2022 0914     BMET    Component Value Date/Time   NA 143 05/21/2022 1353   NA 141 01/25/2022 0914   K 3.4 (L) 05/21/2022 1353   CL 104 05/21/2022 1220   CO2 23 05/17/2022 1215   GLUCOSE 152 (H) 05/21/2022 1220   BUN 6 05/21/2022 1220   BUN 10 01/25/2022 0914   CREATININE 0.30 (L) 05/21/2022 1220   CREATININE 0.82 12/31/2021 1054   CREATININE 0.60 07/21/2014 1343   CALCIUM 9.2 05/17/2022 1215   EGFR 109 01/25/2022 0914   GFRNONAA >60 05/17/2022 1215   GFRNONAA >60 12/31/2021 1054     A/P:  Stable postop course. Continue current plans

## 2022-05-21 NOTE — Progress Notes (Signed)
  Echocardiogram Echocardiogram Transesophageal has been performed.  Amy Terry 05/21/2022, 2:04 PM

## 2022-05-21 NOTE — Progress Notes (Signed)
RT NOTE: rapid wean started at 1740.

## 2022-05-21 NOTE — Anesthesia Procedure Notes (Signed)
Arterial Line Insertion Start/End10/05/2022 7:10 AM, 05/21/2022 7:15 AM Performed by: Roderic Palau, MD, anesthesiologist  Patient location: Pre-op. Preanesthetic checklist: patient identified, IV checked, site marked, risks and benefits discussed, surgical consent, monitors and equipment checked, pre-op evaluation, timeout performed and anesthesia consent Lidocaine 1% used for infiltration Left, radial was placed Catheter size: 20 G Hand hygiene performed  and maximum sterile barriers used   Attempts: 1 Procedure performed using ultrasound guided technique. Ultrasound Notes:anatomy identified, needle tip was noted to be adjacent to the nerve/plexus identified, no ultrasound evidence of intravascular and/or intraneural injection and image(s) printed for medical record Following insertion, dressing applied and Biopatch. Post procedure assessment: normal and unchanged  Post procedure complications: second provider assisted and unsuccessful attempts. Patient tolerated the procedure well with no immediate complications.

## 2022-05-21 NOTE — Interval H&P Note (Signed)
History and Physical Interval Note:  05/21/2022 6:39 AM  Amy Terry  has presented today for surgery, with the diagnosis of AS.  The various methods of treatment have been discussed with the patient and family. After consideration of risks, benefits and other options for treatment, the patient has consented to  Procedure(s) with comments: AORTIC VALVE REPLACEMENT (AVR) (N/A) REPLACEMENT ASCENDING AORTA (N/A) TRANSESOPHAGEAL ECHOCARDIOGRAM (TEE) (N/A) possible BENTALL PROCEDURE (N/A) - CIRC ARREST as a surgical intervention.  The patient's history has been reviewed, patient examined, no change in status, stable for surgery.  I have reviewed the patient's chart and labs.  Questions were answered to the patient's satisfaction.     [name]

## 2022-05-22 ENCOUNTER — Inpatient Hospital Stay (HOSPITAL_COMMUNITY): Payer: 59

## 2022-05-22 ENCOUNTER — Other Ambulatory Visit: Payer: Self-pay | Admitting: Cardiology

## 2022-05-22 ENCOUNTER — Encounter (HOSPITAL_COMMUNITY): Payer: Self-pay | Admitting: Surgery

## 2022-05-22 DIAGNOSIS — I350 Nonrheumatic aortic (valve) stenosis: Secondary | ICD-10-CM

## 2022-05-22 LAB — CBC
HCT: 30.4 % — ABNORMAL LOW (ref 36.0–46.0)
HCT: 31.5 % — ABNORMAL LOW (ref 36.0–46.0)
Hemoglobin: 10.1 g/dL — ABNORMAL LOW (ref 12.0–15.0)
Hemoglobin: 10.1 g/dL — ABNORMAL LOW (ref 12.0–15.0)
MCH: 29.3 pg (ref 26.0–34.0)
MCH: 29.3 pg (ref 26.0–34.0)
MCHC: 33.2 g/dL (ref 30.0–36.0)
MCHC: 32.1 g/dL (ref 30.0–36.0)
MCV: 88.1 fL (ref 80.0–100.0)
MCV: 91.3 fL (ref 80.0–100.0)
Platelets: 118 10*3/uL — ABNORMAL LOW (ref 150–400)
Platelets: 128 10*3/uL — ABNORMAL LOW (ref 150–400)
RBC: 3.45 MIL/uL — ABNORMAL LOW (ref 3.87–5.11)
RBC: 3.45 MIL/uL — ABNORMAL LOW (ref 3.87–5.11)
RDW: 14.7 % (ref 11.5–15.5)
RDW: 15 % (ref 11.5–15.5)
WBC: 10 10*3/uL (ref 4.0–10.5)
WBC: 13.8 10*3/uL — ABNORMAL HIGH (ref 4.0–10.5)
nRBC: 0 % (ref 0.0–0.2)
nRBC: 0 % (ref 0.0–0.2)

## 2022-05-22 LAB — GLUCOSE, CAPILLARY
Glucose-Capillary: 106 mg/dL — ABNORMAL HIGH (ref 70–99)
Glucose-Capillary: 107 mg/dL — ABNORMAL HIGH (ref 70–99)
Glucose-Capillary: 109 mg/dL — ABNORMAL HIGH (ref 70–99)
Glucose-Capillary: 110 mg/dL — ABNORMAL HIGH (ref 70–99)
Glucose-Capillary: 125 mg/dL — ABNORMAL HIGH (ref 70–99)
Glucose-Capillary: 142 mg/dL — ABNORMAL HIGH (ref 70–99)
Glucose-Capillary: 98 mg/dL (ref 70–99)
Glucose-Capillary: 99 mg/dL (ref 70–99)

## 2022-05-22 LAB — BASIC METABOLIC PANEL
Anion gap: 4 — ABNORMAL LOW (ref 5–15)
Anion gap: 5 (ref 5–15)
BUN: 8 mg/dL (ref 6–20)
BUN: 10 mg/dL (ref 6–20)
CO2: 22 mmol/L (ref 22–32)
CO2: 24 mmol/L (ref 22–32)
Calcium: 7.1 mg/dL — ABNORMAL LOW (ref 8.9–10.3)
Calcium: 7.3 mg/dL — ABNORMAL LOW (ref 8.9–10.3)
Chloride: 111 mmol/L (ref 98–111)
Chloride: 106 mmol/L (ref 98–111)
Creatinine, Ser: 0.51 mg/dL (ref 0.44–1.00)
Creatinine, Ser: 0.55 mg/dL (ref 0.44–1.00)
GFR, Estimated: >60 mL/min (ref >60)
GFR, Estimated: >60 mL/min (ref >60)
Glucose, Bld: 113 mg/dL — ABNORMAL HIGH (ref 70–99)
Glucose, Bld: 114 mg/dL — ABNORMAL HIGH (ref 70–99)
Potassium: 3.9 mmol/L (ref 3.5–5.1)
Potassium: 3.6 mmol/L (ref 3.5–5.1)
Sodium: 137 mmol/L (ref 135–145)
Sodium: 135 mmol/L (ref 135–145)

## 2022-05-22 LAB — PREPARE CRYOPRECIPITATE
Unit division: 0
Unit division: 0

## 2022-05-22 LAB — BPAM CRYOPRECIPITATE
Blood Product Expiration Date: 202310101814
Blood Product Expiration Date: 202310101842
ISSUE DATE / TIME: 202310101247
ISSUE DATE / TIME: 202310101312
Unit Type and Rh: 5100
Unit Type and Rh: 5100

## 2022-05-22 LAB — MAGNESIUM
Magnesium: 2.8 mg/dL — ABNORMAL HIGH (ref 1.7–2.4)
Magnesium: 2.8 mg/dL — ABNORMAL HIGH (ref 1.7–2.4)

## 2022-05-22 LAB — SURGICAL PATHOLOGY

## 2022-05-22 MED ORDER — FENTANYL CITRATE PF 50 MCG/ML IJ SOSY
12.5000 ug | PREFILLED_SYRINGE | INTRAMUSCULAR | Status: DC | PRN
  Administered 2022-05-22 (×3): 12.5 ug via INTRAVENOUS
  Filled 2022-05-22 (×3): qty 1

## 2022-05-22 MED ORDER — FUROSEMIDE 10 MG/ML IJ SOLN
40.0000 mg | Freq: Once | INTRAMUSCULAR | Status: AC
  Administered 2022-05-22: 40 mg via INTRAVENOUS
  Filled 2022-05-22: qty 4

## 2022-05-22 MED ORDER — FENTANYL CITRATE PF 50 MCG/ML IJ SOSY
25.0000 ug | PREFILLED_SYRINGE | INTRAMUSCULAR | Status: DC | PRN
  Administered 2022-05-22: 25 ug via INTRAVENOUS
  Filled 2022-05-22: qty 1

## 2022-05-22 MED ORDER — METOCLOPRAMIDE HCL 5 MG/ML IJ SOLN
10.0000 mg | Freq: Four times a day (QID) | INTRAMUSCULAR | Status: AC
  Administered 2022-05-22 (×4): 10 mg via INTRAVENOUS
  Filled 2022-05-22 (×4): qty 2

## 2022-05-22 MED ORDER — INSULIN ASPART 100 UNIT/ML IJ SOLN
0.0000 [IU] | INTRAMUSCULAR | Status: DC
  Administered 2022-05-22: 2 [IU] via SUBCUTANEOUS

## 2022-05-22 MED ORDER — SODIUM CHLORIDE 0.9 % IV SOLN
12.5000 mg | Freq: Four times a day (QID) | INTRAVENOUS | Status: DC | PRN
  Administered 2022-05-23: 12.5 mg via INTRAVENOUS
  Filled 2022-05-22 (×2): qty 0.5

## 2022-05-22 MED ORDER — PHENOL 1.4 % MT LIQD
1.0000 | OROMUCOSAL | Status: DC | PRN
  Administered 2022-05-22: 1 via OROMUCOSAL
  Filled 2022-05-22: qty 177

## 2022-05-22 MED ORDER — TRAMADOL HCL 50 MG PO TABS
50.0000 mg | ORAL_TABLET | ORAL | Status: DC | PRN
  Administered 2022-05-25 (×2): 50 mg via ORAL
  Filled 2022-05-22 (×2): qty 1

## 2022-05-22 MED ORDER — CHLORHEXIDINE GLUCONATE CLOTH 2 % EX PADS
6.0000 | MEDICATED_PAD | Freq: Every day | CUTANEOUS | Status: DC
  Administered 2022-05-22 – 2022-05-25 (×3): 6 via TOPICAL

## 2022-05-22 MED ORDER — POTASSIUM CHLORIDE CRYS ER 20 MEQ PO TBCR
20.0000 meq | EXTENDED_RELEASE_TABLET | ORAL | Status: AC
  Administered 2022-05-22 – 2022-05-23 (×3): 20 meq via ORAL
  Filled 2022-05-22 (×3): qty 1

## 2022-05-22 MED ORDER — KETOROLAC TROMETHAMINE 15 MG/ML IJ SOLN
15.0000 mg | Freq: Four times a day (QID) | INTRAMUSCULAR | Status: DC | PRN
  Administered 2022-05-22 – 2022-05-23 (×2): 15 mg via INTRAVENOUS
  Filled 2022-05-22 (×3): qty 1

## 2022-05-22 MED ORDER — KETOROLAC TROMETHAMINE 15 MG/ML IJ SOLN
15.0000 mg | Freq: Once | INTRAMUSCULAR | Status: AC
  Administered 2022-05-22: 15 mg via INTRAVENOUS
  Filled 2022-05-22: qty 1

## 2022-05-22 MED FILL — Potassium Chloride Inj 2 mEq/ML: INTRAVENOUS | Qty: 40 | Status: AC

## 2022-05-22 MED FILL — Mannitol IV Soln 20%: INTRAVENOUS | Qty: 500 | Status: AC

## 2022-05-22 MED FILL — Lidocaine HCl Local Preservative Free (PF) Inj 2%: INTRAMUSCULAR | Qty: 14 | Status: AC

## 2022-05-22 MED FILL — Heparin Sodium (Porcine) Inj 1000 Unit/ML: Qty: 1000 | Status: AC

## 2022-05-22 MED FILL — Sodium Bicarbonate IV Soln 8.4%: INTRAVENOUS | Qty: 50 | Status: AC

## 2022-05-22 MED FILL — Electrolyte-R (PH 7.4) Solution: INTRAVENOUS | Qty: 6000 | Status: AC

## 2022-05-22 MED FILL — Albumin, Human Inj 5%: INTRAVENOUS | Qty: 250 | Status: AC

## 2022-05-22 MED FILL — Thrombin (Recombinant) For Soln 20000 Unit: CUTANEOUS | Qty: 1 | Status: AC

## 2022-05-22 MED FILL — Heparin Sodium (Porcine) Inj 1000 Unit/ML: INTRAMUSCULAR | Qty: 10 | Status: AC

## 2022-05-22 MED FILL — Calcium Chloride Inj 10%: INTRAVENOUS | Qty: 10 | Status: AC

## 2022-05-22 NOTE — Progress Notes (Signed)
1 Day Post-Op Procedure(s) (LRB): AORTIC VALVE REPLACEMENT (AVR) USING A 21MM INSPIRIS TISSUE VALVE (N/A) REPLACEMENT ASCENDING AORTA USING A 26 X 10MM HEMASHIELD AND AND A 24MM X 30CM HEMASHEILD GRAFT (N/A) TRANSESOPHAGEAL ECHOCARDIOGRAM (TEE) (N/A) Subjective: Has had nausea overnight and vomited. May be due to Morphine so switched to fentanyl.   Pain, probably from chest tubes  Objective: Vital signs in last 24 hours: Temp:  [94.6 F (34.8 C)-99.3 F (37.4 C)] 99.1 F (37.3 C) (10/11 0600) Pulse Rate:  [59-80] 70 (10/11 0600) Cardiac Rhythm: A-V Sequential paced (10/11 0000) Resp:  [8-27] 18 (10/11 0600) BP: (87-147)/(61-112) 89/62 (10/11 0600) SpO2:  [98 %-100 %] 100 % (10/11 0600) Arterial Line BP: (84-148)/(49-87) 109/51 (10/11 0600) FiO2 (%):  [40 %-50 %] 40 % (10/10 1803) Weight:  [69.4 kg] 69.4 kg (10/11 0659)  Hemodynamic parameters for last 24 hours: PAP: (18-31)/(-1-22) 29/17 CO:  [2.4 L/min-3.5 L/min] 3.5 L/min CI:  [1.5 L/min/m2-2.1 L/min/m2] 2.1 L/min/m2  Intake/Output from previous day: 10/10 0701 - 10/11 0700 In: 4717.9 [I.V.:2476.7; Blood:670; IV Piggyback:1571.2] Out: 3335 [Urine:2545; Blood:600; Chest Tube:190] Intake/Output this shift: No intake/output data recorded.  General appearance: alert and cooperative Neurologic: intact Heart: regular rate and rhythm, S1, S2 normal, no murmur Lungs: clear to auscultation bilaterally Extremities: edema mild Wound: dressing dry  Lab Results: Recent Labs    05/21/22 2003 05/21/22 2026 05/22/22 0302  WBC 10.9*  --  10.0  HGB 10.9* 12.6 10.1*  HCT 32.5* 37.0 30.4*  PLT 136*  --  118*   BMET:  Recent Labs    05/21/22 2003 05/21/22 2026 05/22/22 0302  NA 140 143 137  K 4.2 4.3 3.9  CL 111  --  111  CO2 24  --  22  GLUCOSE 152*  --  113*  BUN 8  --  8  CREATININE 0.54  --  0.51  CALCIUM 7.0*  --  7.1*    PT/INR:  Recent Labs    05/21/22 1356  LABPROT 19.8*  INR 1.7*   ABG    Component  Value Date/Time   PHART 7.344 (L) 05/21/2022 2026   HCO3 21.6 05/21/2022 2026   TCO2 23 05/21/2022 2026   ACIDBASEDEF 4.0 (H) 05/21/2022 2026   O2SAT 99 05/21/2022 2026   CBG (last 3)  Recent Labs    05/22/22 0259 05/22/22 0508 05/22/22 0625  GLUCAP 106* 107* 98   CXR: clear  ECG: sinus, no acute changes  Assessment/Plan: S/P Procedure(s) (LRB): AORTIC VALVE REPLACEMENT (AVR) USING A 21MM INSPIRIS TISSUE VALVE (N/A) REPLACEMENT ASCENDING AORTA USING A 26 X 10MM HEMASHIELD AND AND A 24MM X 30CM HEMASHEILD GRAFT (N/A) TRANSESOPHAGEAL ECHOCARDIOGRAM (TEE) (N/A)  POD 1 Hemodynamically stable in sinus rhythm. Will hold off on beta blocker today since she just got off neo. DC swan and arterial line.  Volume excess: wt is 11 lbs over preop. Will wait to diurese to be sure BP is stable this morning off neo.  Dangle again and remove all chest tubes since output low. That should help her pain.  Reglan, phenergan for nausea as needed.  Toradol added for pain.  IS, OOB.  Glucose under good control. Postop hyperglycemia due to intraop steroids. DC insulin drip and continue SSI for now.   LOS: 1 day    [name] 05/22/2022

## 2022-05-22 NOTE — Progress Notes (Signed)
     [address]       [place] [address]             [phone]       EVENING ROUNDS POD# AVR and Replacement Asc Aorta Hemodynamics ok off all drips Comfortable in chair

## 2022-05-22 NOTE — Discharge Instructions (Signed)

## 2022-05-22 NOTE — Discharge Summary (Signed)
[address]       [place] [address]             [phone]    Physician Discharge Summary  Patient ID: Amy Terry MRN: [id number] DOB/AGE: [birth year] 51 y.o.  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 05/21/2022   Aortic valve stenosis, severe 04/10/2022   Heart murmur 04/10/2022   Iron deficiency anemia 07/26/2014   Palpitations 07/21/2014   Allergic rhinitis 07/21/2014   Abdominal pain 06/08/2014   Acute sinusitis 06/08/2014   Cold intolerance 06/08/2014   Fatigue 06/08/2014   Low back pain 06/08/2014   Viral gastroenteritis 06/08/2014   Discharged Condition: good  PCP is Copland, Gay Filler, MD Referring Provider is [name], MD Primary Cardiologist is [name], MD  History of Present Illness: This 51 year old woman has stage D, severe, symptomatic aortic stenosis with New York Heart Association class II symptoms of exertional fatigue and shortness of breath as well as exertional chest tightness consistent with chronic diastolic congestive heart failure.  Her echocardiogram shows an indeterminate number of of aortic valve leaflets with a mean gradient across the aortic valve that has increased to 40 mmHg and a valve area of 0.51 cm consistent with severe aortic stenosis.  Her left ventricular systolic function is normal.  Recent CTA of the chest shows her  ascending aorta is 2 times the size of her descending aorta, although only 3.8 cm.  It has increased from 3.0 cm in 2008.  The aortic root appears to be of normal size.  I think the best treatment for her is aortic valve replacement with supra-coronary replacement of the ascending aorta up to the proximal aortic arch.  I reviewed the CTA images with her and answered her questions.  She has a long history of iron deficiency anemia of unclear etiology and has been treated by hematology.  I think it would be best to use a bioprosthetic valve in her case even though she is 51 years old.  If she had a mechanical valve with lifelong need for Coumadin it could be a disaster if she continued to have iron deficiency anemia due to blood loss in her GI tract or elsewhere.  The durability of the Edwards Inspiris Resilia pericardial valve is expected to be 20+ years and I think the benefit of not having to be on Coumadin for her outweighs the risk of structural valve deterioration.  I discussed this with her and she is in agreement. I discussed the operative procedure with the patient  including alternatives, benefits and risks; including but not limited to bleeding, blood transfusion, infection, stroke, myocardial infarction, graft failure, heart block requiring a permanent pacemaker, organ dysfunction, and death.  Amy Terry understands and agrees to proceed.   Hospital Course: Amy Terry was admitted for elective surgery on 05/21/22 and taken to the OR where the aortic valve was replaced with a  72m Inspiris bioprosthetic valve and the ascending aorta was replaced with 238mand 2655memashield grafts. Following the procedure, she separarted from cardiopulmonary bypass without any inotropic agents. She was transferred to the ICU in stable condition.   The patient was extubated the evening of surgery.  She had issue with PONV felt to be due to Morphine.  She was transitioned to Fentanyl for pain control.  She was  started on diuretics for volume overloaded state.  She was not started on a BB due to hypotension.  She was weaned off Neo as hemodynamics allowed.  She was hyperglycemic due to intraoperative steroids and treated with SSIP.  Her chest tubes were removed without difficulty.  Her blood sugars improved and SSIP was discontinued. She was noted to have a moderately sized left sided pneumothorax on CXR, follow up CXR showed a reduction in pneumothorax size. She continued to saturate 95-100% on 1L [place] of O2. Pneumothorax remained stable and she was weaned off oxygen. She was felt stable for transfer to progressive unit.  Her pacing wires were removed without difficulty.  The patient is tachycardic.  However due to hypotension beta blocker therapy could not be initiated.  Her left pneumothorax was monitored closely and overall remained stable. She was started on midodrine for hypotension and dizziness. Volume overload state was corrected so diuresis was discontinued. Incision was healing well. She was felt medically stable for discharge.     Consults: None  Significant Diagnostic Studies:   Echocardiogram:    IMPRESSIONS    1. Left ventricular ejection fraction, by estimation, is 55 to 60%. The  left ventricle has normal function. The left ventricle has no regional  wall motion abnormalities. Left ventricular diastolic parameters were  normal.   2. Right ventricular systolic function is normal. The right ventricular  size is normal. There is normal pulmonary artery systolic pressure. The  estimated right ventricular systolic pressure is 25.38.4Hg.   3. The mitral valve is normal in structure. Trivial mitral valve  regurgitation. No evidence of mitral stenosis.   4. Tricuspid valve regurgitation is mild to moderate.   5. The aortic valve is abnormal. Unable to determine aortic valve  morphology due to image quality on this study, however previously  described as bicuspid. Review of prior studies  suggests calcified raphe  and fusion of left and non-coronary cusps. Aortic   valve regurgitation is trivial. Severe aortic valve stenosis. Aortic  valve mean gradient measures 40.0 mmHg. Aortic valve Vmax measures 3.97  m/s. Valve area 0.51 cm2 but may be underestimated due to low LVOT TVI.   6. The inferior vena cava is normal in size with greater than 50%  respiratory variability, suggesting right atrial pressure of 3 mmHg.   Treatments: surgery:   05/21/2022   Surgeon:  BryGaye PollackD   First Assistant: MyrEnid CutterPA-C: An experienced assistant was required given the complexity of this surgery and the standard of surgical care. The assistant was needed for exposure, dissection, suctioning, retraction of delicate tissues and sutures, instrument exchange and for overall help during this procedure.     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    Postoperative Diagnosis:  Same    Procedure:   Median Sternotomy Extracorporeal circulation 3.   Supra-coronary replacement of the ascending aorta (hemi-arch) using a 26 mm Hemashield graft under deep hypothermic circulatory arrest and 24 mm interposition graft. 4.   Aortic valve replacement using a 21  mm Edwards INSPIRIS RESILIA pericardial valve.  Discharge Exam: Blood pressure (!) 93/58, pulse 94, temperature 98.2 F (36.8 C), temperature source Oral, resp. rate 20, height '5\' 2"'  (1.575 m), weight 64.2 kg, last menstrual period 05/04/2022, SpO2 95 %.  General appearance: alert, cooperative, and no distress Neurologic: intact Heart: NSR-ST, no murmur, rub, or gallop Lungs: clear to auscultation bilaterally Abdomen: soft, non-tender; bowel sounds normal; no masses,  no organomegaly Extremities: extremities normal, atraumatic, no cyanosis or edema Wound: Clean, dry, intact    Discharge Medications:  The patient has been discharged on:   1.Beta Blocker:  Yes [    ]                              No   [  x ]                              If No, reason: hypotension  2.Ace Inhibitor/ARB: Yes [   ]                                     No  [  x  ]                                     If No, reason: hypotension  3.Statin:   Yes [   ]                  No  [  x ]                  If No, reason: no CAD  4.[place]:  Yes  [  x ]                  No   [   ]                  If No, reason:  Patient had ACS upon admission: no  Plavix/P2Y12 inhibitor: Yes [   ]                                      No  [ x  ]     Discharge Instructions     Amb Referral to Cardiac Rehabilitation   Complete by: As directed    Diagnosis: Valve Replacement   Valve: Aortic   After initial evaluation and assessments completed: Virtual Based Care may be provided alone or in conjunction with Phase 2 Cardiac Rehab based on patient barriers.: Yes   Intensive Cardiac Rehabilitation (ICR) [place] location only OR Traditional Cardiac Rehabilitation (TCR) *If criteria for ICR are not met will enroll in TCR [facility] only): Yes      Allergies as of 05/28/2022       Reactions   Doxycycline Other (See Comments)   Causes severe heartburn   Ivp Dye [iodinated Contrast Media] Palpitations   Other Rash   Tegaderm        Medication List     TAKE these medications    acetaminophen 500 MG tablet Commonly known as: TYLENOL Take 1,000 mg by mouth every 6 (six) hours as  needed for headache or moderate pain.   aspirin EC 325 MG tablet Take 1 tablet (325 mg total) by mouth daily.   fluticasone 50 MCG/ACT nasal spray Commonly known as: FLONASE Place 1 spray into both nostrils daily as needed for allergies or rhinitis.   midodrine 5 MG tablet Commonly known as: PROAMATINE Take 1 tablet (5 mg total) by mouth 3 (three) times daily with meals.   oxyCODONE 5 MG immediate release tablet Commonly known as: Oxy IR/ROXICODONE Take 1 tablet (5 mg total) by mouth every 4 (four) hours as needed  for severe pain.        Follow-up Information     [name], MD Follow up on 06/19/2022.   Specialty: Cardiothoracic Surgery Why: Appointment is at 12:00PM Contact information: [address] [place] [address] [place] Follow up on 06/19/2022.   Why: Appointment is at 11:00AM for a chest xray Contact information: [place]        [facility] [place] Follow up on 05/30/2022.   Specialty: Cardiothoracic Surgery Why: Appointment is at 10:00AM for suture removal Contact information: [address] [place], [place] [phone]                Signed:  Magdalene River, PA-C  05/28/2022, 7:28 AM

## 2022-05-23 ENCOUNTER — Inpatient Hospital Stay (HOSPITAL_COMMUNITY): Payer: 59

## 2022-05-23 LAB — BASIC METABOLIC PANEL
Anion gap: 7 (ref 5–15)
BUN: 8 mg/dL (ref 6–20)
CO2: 29 mmol/L (ref 22–32)
Calcium: 7.9 mg/dL — ABNORMAL LOW (ref 8.9–10.3)
Chloride: 104 mmol/L (ref 98–111)
Creatinine, Ser: 0.65 mg/dL (ref 0.44–1.00)
GFR, Estimated: >60 mL/min (ref >60)
Glucose, Bld: 110 mg/dL — ABNORMAL HIGH (ref 70–99)
Potassium: 3.8 mmol/L (ref 3.5–5.1)
Sodium: 140 mmol/L (ref 135–145)

## 2022-05-23 LAB — CBC
HCT: 28.8 % — ABNORMAL LOW (ref 36.0–46.0)
Hemoglobin: 9.5 g/dL — ABNORMAL LOW (ref 12.0–15.0)
MCH: 29.4 pg (ref 26.0–34.0)
MCHC: 33 g/dL (ref 30.0–36.0)
MCV: 89.2 fL (ref 80.0–100.0)
Platelets: 137 10*3/uL — ABNORMAL LOW (ref 150–400)
RBC: 3.23 MIL/uL — ABNORMAL LOW (ref 3.87–5.11)
RDW: 15.1 % (ref 11.5–15.5)
WBC: 11.8 10*3/uL — ABNORMAL HIGH (ref 4.0–10.5)
nRBC: 0 % (ref 0.0–0.2)

## 2022-05-23 LAB — GLUCOSE, CAPILLARY: Glucose-Capillary: 108 mg/dL — ABNORMAL HIGH (ref 70–99)

## 2022-05-23 MED ORDER — POTASSIUM CHLORIDE CRYS ER 20 MEQ PO TBCR
20.0000 meq | EXTENDED_RELEASE_TABLET | Freq: Every day | ORAL | Status: AC
  Administered 2022-05-23 – 2022-05-25 (×3): 20 meq via ORAL
  Filled 2022-05-23 (×3): qty 1

## 2022-05-23 MED ORDER — FUROSEMIDE 40 MG PO TABS
40.0000 mg | ORAL_TABLET | Freq: Every day | ORAL | Status: AC
  Administered 2022-05-23 – 2022-05-25 (×3): 40 mg via ORAL
  Filled 2022-05-23 (×3): qty 1

## 2022-05-23 NOTE — Progress Notes (Signed)
Contacted by the radiology service regarding the finding of a moderate left pneumothorax on CXR this morning. CDXR reviewed: The PTX measures 1.5--2.5cm.  Ms. Mealor say she is having more pain on the right side of her chest than the left but admits feeling more short of breath today compared to yesterday.  O2 st 95-100% on 1Lnc O2 and she has no increase in work of breathing.   Will repeat the CXR at 12 noon today and re-assess.   Macarthur Critchley, PA-C

## 2022-05-23 NOTE — Progress Notes (Signed)
2 Days Post-Op Procedure(s) (LRB): AORTIC VALVE REPLACEMENT (AVR) USING A 21MM INSPIRIS TISSUE VALVE (N/A) REPLACEMENT ASCENDING AORTA USING A 26 X 10MM HEMASHIELD AND AND A 24MM X 30CM HEMASHEILD GRAFT (N/A) TRANSESOPHAGEAL ECHOCARDIOGRAM (TEE) (N/A) Subjective: Walked this am but had some nausea. Thinks it was from KCL  Pain better with chest tubes out.  Objective: Vital signs in last 24 hours: Temp:  [97.6 F (36.4 C)-99.3 F (37.4 C)] 97.6 F (36.4 C) (10/12 0400) Pulse Rate:  [70-90] 89 (10/12 0645) Cardiac Rhythm: Atrial paced (10/12 0400) Resp:  [13-28] 15 (10/12 0645) BP: (84-109)/(52-72) 92/64 (10/12 0645) SpO2:  [93 %-100 %] 93 % (10/12 0645) Arterial Line BP: (115)/(57) 115/57 (10/11 0700) Weight:  [68.7 kg-69.4 kg] 68.7 kg (10/12 0500)  Hemodynamic parameters for last 24 hours: PAP: (19-32)/(6-20) 22/9 CO:  [3.8 L/min] 3.8 L/min CI:  [2.3 L/min/m2] 2.3 L/min/m2  Intake/Output from previous day: 10/11 0701 - 10/12 0700 In: 986.9 [I.V.:626.8; IV Piggyback:360.1] Out: 1360 [Urine:1110; Chest Tube:250] Intake/Output this shift: Total I/O In: 382.1 [I.V.:182.1; IV Piggyback:200.1] Out: 560 [Urine:560]  General appearance: alert and cooperative Neurologic: intact Heart: regular rate and rhythm, S1, S2 normal, no murmur Lungs: clear to auscultation bilaterally Abdomen: soft, non-tender; bowel sounds normal Extremities: edema mild Wound: dressing dry  Lab Results: Recent Labs    05/22/22 1604 05/23/22 0352  WBC 13.8* 11.8*  HGB 10.1* 9.5*  HCT 31.5* 28.8*  PLT 128* 137*   BMET:  Recent Labs    05/22/22 1604 05/23/22 0352  NA 135 140  K 3.6 3.8  CL 106 104  CO2 24 29  GLUCOSE 114* 110*  BUN 10 8  CREATININE 0.55 0.65  CALCIUM 7.3* 7.9*    PT/INR:  Recent Labs    05/21/22 1356  LABPROT 19.8*  INR 1.7*   ABG    Component Value Date/Time   PHART 7.344 (L) 05/21/2022 2026   HCO3 21.6 05/21/2022 2026   TCO2 23 05/21/2022 2026    ACIDBASEDEF 4.0 (H) 05/21/2022 2026   O2SAT 99 05/21/2022 2026   CBG (last 3)  Recent Labs    05/22/22 2013 05/22/22 2335 05/23/22 0400  GLUCAP 142* 109* 108*   CXR ok  Assessment/Plan: S/P Procedure(s) (LRB): AORTIC VALVE REPLACEMENT (AVR) USING A 21MM INSPIRIS TISSUE VALVE (N/A) REPLACEMENT ASCENDING AORTA USING A 26 X 10MM HEMASHIELD AND AND A 24MM X 30CM HEMASHEILD GRAFT (N/A) TRANSESOPHAGEAL ECHOCARDIOGRAM (TEE) (N/A)  POD 2 Required some neo overnight for MAP 60 but off now. Atrial paced 90 but underlying rhythm sinus 70's.  Wt is down 2 lbs from yesterday but 10 lbs over preop. Will wait to diurese until BP rises.  Glucose under good control. DC CBG's  DC foley and sleeve  IS, OOB, ambulate.  May be able to transfer to 4E later if BP remains stable.   LOS: 2 days    Amy Terry 05/23/2022

## 2022-05-23 NOTE — Progress Notes (Signed)
EVENING ROUNDS NOTE :     [address]       [place] [address]             [phone]                 2 Days Post-Op Procedure(s) (LRB): AORTIC VALVE REPLACEMENT (AVR) USING A 21MM INSPIRIS TISSUE VALVE (N/A) REPLACEMENT ASCENDING AORTA USING A 26 X 10MM HEMASHIELD AND AND A 24MM X 30CM HEMASHEILD GRAFT (N/A) TRANSESOPHAGEAL ECHOCARDIOGRAM (TEE) (N/A)   Total Length of Stay:  LOS: 2 days  Events:   Up to chair Stable day CXR stable   BP 105/66   Pulse 90   Temp 98.3 F (36.8 C) (Oral)   Resp (!) 29   Ht '5\' 2"'$  (1.575 m)   Wt 68.7 kg   LMP 05/04/2022   SpO2 97%   BMI 27.69 kg/m          sodium chloride     phenylephrine (NEO-SYNEPHRINE) Adult infusion 10 mcg/min (05/23/22 0800)   promethazine (PHENERGAN) injection (IM or IVPB) 12.5 mg (05/23/22 0910)    I/O last 3 completed shifts: In: 2482.5 [I.V.:1237.3; IV Piggyback:1245.2] Out: 0998 [Urine:1385; Chest Tube:430]      Latest Ref Rng & Units 05/23/2022    3:52 AM 05/22/2022    4:04 PM 05/22/2022    3:02 AM  CBC  WBC 4.0 - 10.5 K/uL 11.8  13.8  10.0   Hemoglobin 12.0 - 15.0 g/dL 9.5  10.1  10.1   Hematocrit 36.0 - 46.0 % 28.8  31.5  30.4   Platelets 150 - 400 K/uL 137  128  118        Latest Ref Rng & Units 05/23/2022    3:52 AM 05/22/2022    4:04 PM 05/22/2022    3:02 AM  BMP  Glucose 70 - 99 mg/dL 110  114  113   BUN 6 - 20 mg/dL '8  10  8   '$ Creatinine 0.44 - 1.00 mg/dL 0.65  0.55  0.51   Sodium 135 - 145 mmol/L 140  135  137   Potassium 3.5 - 5.1 mmol/L 3.8  3.6  3.9   Chloride 98 - 111 mmol/L 104  106  111   CO2 22 - 32 mmol/L '29  24  22   '$ Calcium 8.9 - 10.3 mg/dL 7.9  7.3  7.1     ABG    Component Value Date/Time   PHART 7.344 (L) 05/21/2022 2026   PCO2ART 39.7 05/21/2022 2026   PO2ART 175 (H) 05/21/2022 2026   HCO3 21.6 05/21/2022 2026   TCO2 23 05/21/2022 2026   ACIDBASEDEF 4.0 (H) 05/21/2022 2026   O2SAT 99 05/21/2022 2026       Melodie Bouillon,  MD 05/23/2022 5:56 PM

## 2022-05-24 ENCOUNTER — Inpatient Hospital Stay (HOSPITAL_COMMUNITY): Payer: 59

## 2022-05-24 LAB — CBC
HCT: 28.8 % — ABNORMAL LOW (ref 36.0–46.0)
Hemoglobin: 9.5 g/dL — ABNORMAL LOW (ref 12.0–15.0)
MCH: 29.5 pg (ref 26.0–34.0)
MCHC: 33 g/dL (ref 30.0–36.0)
MCV: 89.4 fL (ref 80.0–100.0)
Platelets: 135 10*3/uL — ABNORMAL LOW (ref 150–400)
RBC: 3.22 MIL/uL — ABNORMAL LOW (ref 3.87–5.11)
RDW: 14.9 % (ref 11.5–15.5)
WBC: 8.8 10*3/uL (ref 4.0–10.5)
nRBC: 0 % (ref 0.0–0.2)

## 2022-05-24 LAB — BASIC METABOLIC PANEL
Anion gap: 4 — ABNORMAL LOW (ref 5–15)
BUN: 7 mg/dL (ref 6–20)
CO2: 27 mmol/L (ref 22–32)
Calcium: 7.7 mg/dL — ABNORMAL LOW (ref 8.9–10.3)
Chloride: 107 mmol/L (ref 98–111)
Creatinine, Ser: 0.51 mg/dL (ref 0.44–1.00)
GFR, Estimated: >60 mL/min (ref >60)
Glucose, Bld: 100 mg/dL — ABNORMAL HIGH (ref 70–99)
Potassium: 4 mmol/L (ref 3.5–5.1)
Sodium: 138 mmol/L (ref 135–145)

## 2022-05-24 NOTE — Progress Notes (Signed)
3 Days Post-Op Procedure(s) (LRB): AORTIC VALVE REPLACEMENT (AVR) USING A 21MM INSPIRIS TISSUE VALVE (N/A) REPLACEMENT ASCENDING AORTA USING A 26 X 10MM HEMASHIELD AND AND A 24MM X 30CM HEMASHEILD GRAFT (N/A) TRANSESOPHAGEAL ECHOCARDIOGRAM (TEE) (N/A) Subjective:  Some shortness of breath but ambulating. On room air. Sitting up eating breakfast.  Objective: Vital signs in last 24 hours: Temp:  [97.9 F (36.6 C)-98.6 F (37 C)] 98.2 F (36.8 C) (10/13 0300) Pulse Rate:  [69-90] 69 (10/13 0545) Cardiac Rhythm: Normal sinus rhythm (10/12 2000) Resp:  [13-29] 19 (10/13 0545) BP: (89-113)/(57-72) 94/62 (10/13 0500) SpO2:  [95 %-100 %] 95 % (10/13 0545) Weight:  [66.7 kg] 66.7 kg (10/13 0500)  Hemodynamic parameters for last 24 hours:    Intake/Output from previous day: 10/12 0701 - 10/13 0700 In: 500.5 [P.O.:480; I.V.:20.5] Out: 530 [Urine:530] Intake/Output this shift: No intake/output data recorded.  General appearance: alert and cooperative Neurologic: intact Heart: regular rate and rhythm, systolic flow murmur. Lungs: clear to auscultation bilaterally Extremities: no edema Wound: incision looks good  Lab Results: Recent Labs    05/23/22 0352 05/24/22 0458  WBC 11.8* 8.8  HGB 9.5* 9.5*  HCT 28.8* 28.8*  PLT 137* 135*   BMET:  Recent Labs    05/23/22 0352 05/24/22 0458  NA 140 138  K 3.8 4.0  CL 104 107  CO2 29 27  GLUCOSE 110* 100*  BUN 8 7  CREATININE 0.65 0.51  CALCIUM 7.9* 7.7*    PT/INR:  Recent Labs    05/21/22 1356  LABPROT 19.8*  INR 1.7*   ABG    Component Value Date/Time   PHART 7.344 (L) 05/21/2022 2026   HCO3 21.6 05/21/2022 2026   TCO2 23 05/21/2022 2026   ACIDBASEDEF 4.0 (H) 05/21/2022 2026   O2SAT 99 05/21/2022 2026   CBG (last 3)  Recent Labs    05/22/22 2013 05/22/22 2335 05/23/22 0400  GLUCAP 142* 109* 108*   CXR: small left ptx unchanged.  Assessment/Plan: S/P Procedure(s) (LRB): AORTIC VALVE REPLACEMENT (AVR)  USING A 21MM INSPIRIS TISSUE VALVE (N/A) REPLACEMENT ASCENDING AORTA USING A 26 X 10MM HEMASHIELD AND AND A 24MM X 30CM HEMASHEILD GRAFT (N/A) TRANSESOPHAGEAL ECHOCARDIOGRAM (TEE) (N/A)  POD 3 Hemodynamically stable in sinus rhythm. No beta blocker since her BP runs low normal in the 90's to 100.  Mild volume excess: Wt is coming down but still 5.5 lbs over preop. Will continue oral lasix for a few more days.  Small left ptx stable on CXR. That should resolve without difficulty.  Transfer to 4E and continue IS, ambulation.   LOS: 3 days    [name] 05/24/2022

## 2022-05-24 NOTE — Progress Notes (Signed)
CT surgery PM rounds  Patient had stable day waiting for bed on 4 E. And waiting in hallway Chest x-ray earlier shows tiny left apical pneumothorax we will continue to monitor.  Vitals:   05/24/22 1700 05/24/22 1800  BP: (!) 99/58 106/70  Pulse: 100 90  Resp:    Temp:    SpO2: 94% 93%

## 2022-05-25 ENCOUNTER — Inpatient Hospital Stay (HOSPITAL_COMMUNITY): Payer: 59

## 2022-05-25 DIAGNOSIS — Q231 Congenital insufficiency of aortic valve: Secondary | ICD-10-CM | POA: Diagnosis not present

## 2022-05-25 DIAGNOSIS — I7121 Aneurysm of the ascending aorta, without rupture: Secondary | ICD-10-CM | POA: Diagnosis not present

## 2022-05-25 DIAGNOSIS — J90 Pleural effusion, not elsewhere classified: Secondary | ICD-10-CM | POA: Diagnosis not present

## 2022-05-25 DIAGNOSIS — Z79899 Other long term (current) drug therapy: Secondary | ICD-10-CM | POA: Diagnosis not present

## 2022-05-25 DIAGNOSIS — D509 Iron deficiency anemia, unspecified: Secondary | ICD-10-CM | POA: Diagnosis not present

## 2022-05-25 DIAGNOSIS — J939 Pneumothorax, unspecified: Secondary | ICD-10-CM | POA: Diagnosis not present

## 2022-05-25 DIAGNOSIS — I739 Peripheral vascular disease, unspecified: Secondary | ICD-10-CM | POA: Diagnosis not present

## 2022-05-25 DIAGNOSIS — Z8616 Personal history of COVID-19: Secondary | ICD-10-CM | POA: Diagnosis not present

## 2022-05-25 DIAGNOSIS — I959 Hypotension, unspecified: Secondary | ICD-10-CM | POA: Diagnosis not present

## 2022-05-25 DIAGNOSIS — Q230 Congenital stenosis of aortic valve: Secondary | ICD-10-CM | POA: Diagnosis not present

## 2022-05-25 LAB — TYPE AND SCREEN
ABO/RH(D): O POS
Antibody Screen: NEGATIVE
Unit division: 0
Unit division: 0
Unit division: 0
Unit division: 0

## 2022-05-25 LAB — CBC
HCT: 31.5 % — ABNORMAL LOW (ref 36.0–46.0)
Hemoglobin: 10.3 g/dL — ABNORMAL LOW (ref 12.0–15.0)
MCH: 29.3 pg (ref 26.0–34.0)
MCHC: 32.7 g/dL (ref 30.0–36.0)
MCV: 89.7 fL (ref 80.0–100.0)
Platelets: 174 10*3/uL (ref 150–400)
RBC: 3.51 MIL/uL — ABNORMAL LOW (ref 3.87–5.11)
RDW: 14.6 % (ref 11.5–15.5)
WBC: 8 10*3/uL (ref 4.0–10.5)
nRBC: 0 % (ref 0.0–0.2)

## 2022-05-25 LAB — BPAM RBC
Blood Product Expiration Date: 202311132359
Blood Product Expiration Date: 202311132359
Blood Product Expiration Date: 202311132359
Blood Product Expiration Date: 202311132359
ISSUE DATE / TIME: 202310100916
ISSUE DATE / TIME: 202310100916
ISSUE DATE / TIME: 202310100916
ISSUE DATE / TIME: 202310100916
Unit Type and Rh: 5100
Unit Type and Rh: 5100
Unit Type and Rh: 5100
Unit Type and Rh: 5100

## 2022-05-25 LAB — BASIC METABOLIC PANEL
Anion gap: 5 (ref 5–15)
BUN: 5 mg/dL — ABNORMAL LOW (ref 6–20)
CO2: 27 mmol/L (ref 22–32)
Calcium: 8.5 mg/dL — ABNORMAL LOW (ref 8.9–10.3)
Chloride: 108 mmol/L (ref 98–111)
Creatinine, Ser: 0.53 mg/dL (ref 0.44–1.00)
GFR, Estimated: >60 mL/min (ref >60)
Glucose, Bld: 99 mg/dL (ref 70–99)
Potassium: 4 mmol/L (ref 3.5–5.1)
Sodium: 140 mmol/L (ref 135–145)

## 2022-05-25 MED ORDER — DOCUSATE SODIUM 100 MG PO CAPS
100.0000 mg | ORAL_CAPSULE | Freq: Two times a day (BID) | ORAL | Status: DC | PRN
  Administered 2022-05-25 – 2022-05-26 (×2): 100 mg via ORAL
  Filled 2022-05-25 (×2): qty 1

## 2022-05-25 NOTE — Progress Notes (Addendum)
4 Days Post-Op Procedure(s) (LRB): AORTIC VALVE REPLACEMENT (AVR) USING A 21MM INSPIRIS TISSUE VALVE (N/A) REPLACEMENT ASCENDING AORTA USING A 26 X 10MM HEMASHIELD AND AND A 24MM X 30CM HEMASHEILD GRAFT (N/A) TRANSESOPHAGEAL ECHOCARDIOGRAM (TEE) (N/A) Subjective: Doing well after AVR, replacement ascending aorta Normal sinus rhythm Surgical pain is improving Chest x-ray clear Ready for transfer to progressive care  Objective: Vital signs in last 24 hours: Temp:  [98.1 F (36.7 C)-98.5 F (36.9 C)] 98.1 F (36.7 C) (10/14 0809) Pulse Rate:  [88-100] 99 (10/14 0800) Cardiac Rhythm: Normal sinus rhythm (10/14 0800) Resp:  [14-25] 18 (10/14 0000) BP: (89-116)/(56-90) 110/90 (10/14 0800) SpO2:  [90 %-97 %] 92 % (10/14 0800) Weight:  [66 kg] 66 kg (10/14 0500)  Hemodynamic parameters for last 24 hours:  stable  Intake/Output from previous day: 10/13 0701 - 10/14 0700 In: 590 [P.O.:540; IV Piggyback:50] Out: 1950 [Urine:1950] Intake/Output this shift: No intake/output data recorded.       Exam    General- alert and comfortable    Neck- no JVD, no cervical adenopathy palpable, no carotid bruit   Lungs- clear without rales, wheezes   Cor- regular rate and rhythm, no murmur , gallop   Abdomen- soft, non-tender   Extremities - warm, non-tender, minimal edema   Neuro- oriented, appropriate, no focal weakness   Lab Results: Recent Labs    05/24/22 0458 05/25/22 0750  WBC 8.8 8.0  HGB 9.5* 10.3*  HCT 28.8* 31.5*  PLT 135* 174   BMET:  Recent Labs    05/24/22 0458 05/25/22 0750  NA 138 140  K 4.0 4.0  CL 107 108  CO2 27 27  GLUCOSE 100* 99  BUN 7 5*  CREATININE 0.51 0.53  CALCIUM 7.7* 8.5*    PT/INR: No results for input(s): "LABPROT", "INR" in the last 72 hours. ABG    Component Value Date/Time   PHART 7.344 (L) 05/21/2022 2026   HCO3 21.6 05/21/2022 2026   TCO2 23 05/21/2022 2026   ACIDBASEDEF 4.0 (H) 05/21/2022 2026   O2SAT 99 05/21/2022 2026   CBG  (last 3)  Recent Labs    05/22/22 2013 05/22/22 2335 05/23/22 0400  GLUCAP 142* 109* 108*    Assessment/Plan: S/P Procedure(s) (LRB): AORTIC VALVE REPLACEMENT (AVR) USING A 21MM INSPIRIS TISSUE VALVE (N/A) REPLACEMENT ASCENDING AORTA USING A 26 X 10MM HEMASHIELD AND AND A 24MM X 30CM HEMASHEILD GRAFT (N/A) TRANSESOPHAGEAL ECHOCARDIOGRAM (TEE) (N/A) Mobilize Diuresis d/c tubes/lines Plan for transfer to step-down: see transfer orders DC EPWs  LOS: 4 days    Amy Terry 05/25/2022

## 2022-05-25 NOTE — Progress Notes (Signed)
CARDIAC REHAB PHASE I   PRE:  Rate/Rhythm: 99 SR  BP:  Supine:   Sitting: 104/61  Standing:    SaO2: 94% RA  MODE:  Ambulation: 370 ft   POST:  Rate/Rhythm: 105 ST  BP:  Supine:   Sitting: 120/69  Standing:    SaO2: 96% RA  [id number] Patient tolerated ambulation well, slow steady, gait, c/o mild SOB. Reviewed open heart surgery education with patient including sternal precautions, IS use, and activity progression. Exercise guidelines and heart healthy diet handouts given. Pt verbalizes understanding of instructions given. Discussed phase 2 cardiac rehab, and pt is interested in the program at [facility], referral sent.  Sol Passer, MS, ACSM CEP

## 2022-05-25 NOTE — Progress Notes (Signed)
Patient arrived to 4E from 2 Heart. Vitals taken and stable. Tele placed and CCMD notified. Patient oriented to unit and staff. Midsternal incision clean, dry, intact. No pain at this time. Call bell within reach and family at the bedside.  [place] C Tuwana Kapaun

## 2022-05-26 ENCOUNTER — Inpatient Hospital Stay (HOSPITAL_COMMUNITY): Payer: 59

## 2022-05-26 LAB — CBC
HCT: 32.3 % — ABNORMAL LOW (ref 36.0–46.0)
Hemoglobin: 10.9 g/dL — ABNORMAL LOW (ref 12.0–15.0)
MCH: 29.6 pg (ref 26.0–34.0)
MCHC: 33.7 g/dL (ref 30.0–36.0)
MCV: 87.8 fL (ref 80.0–100.0)
Platelets: 214 10*3/uL (ref 150–400)
RBC: 3.68 MIL/uL — ABNORMAL LOW (ref 3.87–5.11)
RDW: 14.5 % (ref 11.5–15.5)
WBC: 7.7 10*3/uL (ref 4.0–10.5)
nRBC: 0 % (ref 0.0–0.2)

## 2022-05-26 LAB — BASIC METABOLIC PANEL
Anion gap: 6 (ref 5–15)
BUN: 10 mg/dL (ref 6–20)
CO2: 27 mmol/L (ref 22–32)
Calcium: 8.4 mg/dL — ABNORMAL LOW (ref 8.9–10.3)
Chloride: 104 mmol/L (ref 98–111)
Creatinine, Ser: 0.86 mg/dL (ref 0.44–1.00)
GFR, Estimated: >60 mL/min (ref >60)
Glucose, Bld: 96 mg/dL (ref 70–99)
Potassium: 3.8 mmol/L (ref 3.5–5.1)
Sodium: 137 mmol/L (ref 135–145)

## 2022-05-26 MED ORDER — ASPIRIN 325 MG PO TBEC
325.0000 mg | DELAYED_RELEASE_TABLET | Freq: Every day | ORAL | Status: DC
  Administered 2022-05-26 – 2022-05-28 (×3): 325 mg via ORAL
  Filled 2022-05-26 (×3): qty 1

## 2022-05-26 MED ORDER — SODIUM CHLORIDE 0.9% FLUSH: 3.0000 mL | INTRAVENOUS | Status: DC | PRN

## 2022-05-26 MED ORDER — SODIUM CHLORIDE 0.9% FLUSH
3.0000 mL | Freq: Two times a day (BID) | INTRAVENOUS | Status: DC
  Administered 2022-05-26 – 2022-05-27 (×2): 3 mL via INTRAVENOUS

## 2022-05-26 MED ORDER — MIDODRINE HCL 5 MG PO TABS
5.0000 mg | ORAL_TABLET | Freq: Three times a day (TID) | ORAL | Status: DC
  Administered 2022-05-26 – 2022-05-28 (×4): 5 mg via ORAL
  Filled 2022-05-26 (×4): qty 1

## 2022-05-26 MED ORDER — METOCLOPRAMIDE HCL 5 MG/ML IJ SOLN
10.0000 mg | Freq: Four times a day (QID) | INTRAMUSCULAR | Status: AC
  Filled 2022-05-26: qty 2

## 2022-05-26 MED ORDER — DOCUSATE SODIUM 100 MG PO CAPS
100.0000 mg | ORAL_CAPSULE | Freq: Two times a day (BID) | ORAL | Status: DC
  Administered 2022-05-26 – 2022-05-28 (×4): 100 mg via ORAL
  Filled 2022-05-26 (×4): qty 1

## 2022-05-26 MED ORDER — SODIUM CHLORIDE 0.9 % IV SOLN: 250.0000 mL | INTRAVENOUS | Status: DC | PRN

## 2022-05-26 MED ORDER — ~~LOC~~ CARDIAC SURGERY, PATIENT & FAMILY EDUCATION: Freq: Once | Status: AC

## 2022-05-26 NOTE — Progress Notes (Addendum)
[address]       [place] [address]             [phone]       5 Days Post-Op Procedure(s) (LRB): AORTIC VALVE REPLACEMENT (AVR) USING A 21MM INSPIRIS TISSUE VALVE (N/A) REPLACEMENT ASCENDING AORTA USING A 26 X 10MM HEMASHIELD AND AND A 24MM X 30CM HEMASHEILD GRAFT (N/A) TRANSESOPHAGEAL ECHOCARDIOGRAM (TEE) (N/A)  Subjective:  Patient with complaints of nausea, dizziness and pain in her right shoulder area.  She states when she was up washing up she got dizzy and almost passed out.  + ambulation   + BM  Objective: Vital signs in last 24 hours: Temp:  [98 F (36.7 C)-98.5 F (36.9 C)] 98 F (36.7 C) (10/15 0730) Pulse Rate:  [91-98] 94 (10/15 0730) Cardiac Rhythm: Sinus tachycardia (10/15 0758) Resp:  [18-20] 18 (10/15 0730) BP: (87-129)/(59-83) 105/64 (10/15 0730) SpO2:  [91 %-100 %] 100 % (10/15 0730) Weight:  [65.3 kg] 65.3 kg (10/15 0358)  Intake/Output from previous day: 10/14 0701 - 10/15 0700 In: 120 [P.O.:120] Out: 1500 [Urine:1500]  General appearance: alert, cooperative, and no distress Heart: regular rate and rhythm and tachy Lungs: clear to auscultation bilaterally Abdomen: soft, non-tender; bowel sounds normal; no masses,  no organomegaly Extremities: extremities normal, atraumatic, no cyanosis or edema Wound: clean and dry  Lab Results: Recent Labs    05/25/22 0750 05/26/22 0132  WBC 8.0 7.7  HGB 10.3* 10.9*  HCT 31.5* 32.3*  PLT 174 214   BMET:  Recent Labs    05/25/22 0750 05/26/22 0132  NA 140 137  K 4.0 3.8  CL 108 104  CO2 27 27  GLUCOSE 99 96  BUN 5* 10  CREATININE 0.53 0.86  CALCIUM 8.5* 8.4*    PT/INR: No results for input(s): "LABPROT", "INR" in the last 72 hours. ABG    Component Value Date/Time   PHART 7.344 (L) 05/21/2022 2026   HCO3 21.6 05/21/2022 2026   TCO2 23 05/21/2022 2026   ACIDBASEDEF 4.0 (H) 05/21/2022 2026   O2SAT 99 05/21/2022 2026   CBG (last 3)  No results for input(s):  "GLUCAP" in the last 72 hours.  Assessment/Plan: S/P Procedure(s) (LRB): AORTIC VALVE REPLACEMENT (AVR) USING A 21MM INSPIRIS TISSUE VALVE (N/A) REPLACEMENT ASCENDING AORTA USING A 26 X 10MM HEMASHIELD AND AND A 24MM X 30CM HEMASHEILD GRAFT (N/A) TRANSESOPHAGEAL ECHOCARDIOGRAM (TEE) (N/A)  CV- Sinus Tachycardia, Hypotension- can not start BB due to low blood pressure.. will monitor Pulm- off oxygen, no CXR for today, will obtain... she has a left pneumothorax on previous films will monitor for stability Renal- creatinine is normal, weight is minimally elevated, no edema on exam.. I/O states negative 1 L.. she may be mildly dehydrated after diuresis... no further Lasix, potassium at this time GI- nausea,... using zofran, will add reglan.. she has moved her bowels Dispo- patient with tachycardia unable to start BB due to hypotension, dizziness with nausea today.Marland Kitchen add reglan, encouraged patient to walk as tolerated, continue current care.. if nausea/dizziness resolve should be ready for d/c in next 24-48 hours   LOS: 5 days    Ellwood Handler, PA-C 05/26/2022 Patient having orthostatic symptoms of dizziness.  Currently not able to tolerate beta-blocker.  Not on Lasix but weight only up 1 kg from preop and chest x-ray is clear. We will start 5 mg midodrine dosing to help improve patient's mobility. Maintaining sinus rhythm, lab values satisfactory and sternal incision clean  and dry  patient examined and medical record reviewed,agree with above note. Dahlia Byes 05/26/2022

## 2022-05-27 MED ORDER — BISACODYL 5 MG PO TBEC
10.0000 mg | DELAYED_RELEASE_TABLET | Freq: Every day | ORAL | Status: DC | PRN
  Administered 2022-05-27: 5 mg via ORAL
  Filled 2022-05-27: qty 2

## 2022-05-27 MED ORDER — SENNOSIDES-DOCUSATE SODIUM 8.6-50 MG PO TABS: 2.0000 | ORAL_TABLET | ORAL | Status: DC | PRN

## 2022-05-27 MED ORDER — BISACODYL 5 MG PO TBEC
5.0000 mg | DELAYED_RELEASE_TABLET | Freq: Every day | ORAL | Status: DC | PRN
  Administered 2022-05-28: 5 mg via ORAL
  Filled 2022-05-27: qty 1

## 2022-05-27 MED ORDER — SORBITOL 70 % SOLN
30.0000 mL | Freq: Once | Status: AC
  Administered 2022-05-27: 30 mL via ORAL
  Filled 2022-05-27 (×2): qty 30

## 2022-05-27 MED ORDER — ORAL CARE MOUTH RINSE: 15.0000 mL | OROMUCOSAL | Status: DC | PRN

## 2022-05-27 NOTE — Progress Notes (Signed)
6 Days Post-Op Procedure(s) (LRB): AORTIC VALVE REPLACEMENT (AVR) USING A 21MM INSPIRIS TISSUE VALVE (N/A) REPLACEMENT ASCENDING AORTA USING A 26 X 10MM HEMASHIELD AND AND A 24MM X 30CM HEMASHEILD GRAFT (N/A) TRANSESOPHAGEAL ECHOCARDIOGRAM (TEE) (N/A) Subjective:  Feels better today. Slept ok. Has some right upper back pain. No SOB.  Objective: Vital signs in last 24 hours: Temp:  [97.9 F (36.6 C)-98.3 F (36.8 C)] 97.9 F (36.6 C) (10/16 0425) Pulse Rate:  [95-98] 98 (10/16 0425) Cardiac Rhythm: Normal sinus rhythm (10/15 2006) Resp:  [16-20] 20 (10/16 0425) BP: (102-115)/(54-69) 115/69 (10/16 0425) SpO2:  [95 %-97 %] 97 % (10/16 0425) Weight:  [65 kg] 65 kg (10/16 0422)  Hemodynamic parameters for last 24 hours:    Intake/Output from previous day: 10/15 0701 - 10/16 0700 In: 120 [P.O.:120] Out: -  Intake/Output this shift: No intake/output data recorded.  General appearance: alert and cooperative Neurologic: intact Heart: regular rate and rhythm, S1, S2 normal, 1/6 systolic flow murmur Lungs: clear to auscultation bilaterally Extremities: no edema Wound: incision healing well.  Lab Results: Recent Labs    05/25/22 0750 05/26/22 0132  WBC 8.0 7.7  HGB 10.3* 10.9*  HCT 31.5* 32.3*  PLT 174 214   BMET:  Recent Labs    05/25/22 0750 05/26/22 0132  NA 140 137  K 4.0 3.8  CL 108 104  CO2 27 27  GLUCOSE 99 96  BUN 5* 10  CREATININE 0.53 0.86  CALCIUM 8.5* 8.4*    PT/INR: No results for input(s): "LABPROT", "INR" in the last 72 hours. ABG    Component Value Date/Time   PHART 7.344 (L) 05/21/2022 2026   HCO3 21.6 05/21/2022 2026   TCO2 23 05/21/2022 2026   ACIDBASEDEF 4.0 (H) 05/21/2022 2026   O2SAT 99 05/21/2022 2026   CBG (last 3)  No results for input(s): "GLUCAP" in the last 72 hours.  Assessment/Plan: S/P Procedure(s) (LRB): AORTIC VALVE REPLACEMENT (AVR) USING A 21MM INSPIRIS TISSUE VALVE (N/A) REPLACEMENT ASCENDING AORTA USING A 26 X 10MM  HEMASHIELD AND AND A 24MM X 30CM HEMASHEILD GRAFT (N/A) TRANSESOPHAGEAL ECHOCARDIOGRAM (TEE) (N/A)  POD 6  Hemodynamically stable in sinus rhythm. BP 115/69 this am on midodrine. Hopefully this will help with orthostasis. No beta blocker.  Wt is only 1 lb over preop. No further diuresis needed.  Labs fine yesterday.   Continue ambulation, IS today.  Possibly home tomorrow if she can ambulate today without dizziness.   LOS: 6 days    [name] 05/27/2022

## 2022-05-27 NOTE — Progress Notes (Signed)
CARDIAC REHAB PHASE I   PRE:  Rate/Rhythm: 93 SR    BP: sitting 104/64, standing 105/73    SaO2: 96 RA  MODE:  Ambulation: 850 ft   POST:  Rate/Rhythm: 104 ST    BP: sitting 109/71     SaO2: 98 RA  Pt eager to ambulate. Not orthostatic, feels well. Only c/o is significant back pain, worse with certain movements. VSS with ambulation, increased distance. Reviewed ed, gave OHS book. Pt can ambulate independently but knows to ask for assistance putting feet down on recliner. [id number]   Yves Dill BS, ACSM-CEP 05/27/2022 9:56 AM

## 2022-05-28 ENCOUNTER — Encounter: Payer: Self-pay | Admitting: Family

## 2022-05-28 ENCOUNTER — Other Ambulatory Visit (HOSPITAL_BASED_OUTPATIENT_CLINIC_OR_DEPARTMENT_OTHER): Payer: Self-pay

## 2022-05-28 ENCOUNTER — Ambulatory Visit: Payer: 59

## 2022-05-28 MED ORDER — ASPIRIN 325 MG PO TBEC: 325.0000 mg | DELAYED_RELEASE_TABLET | Freq: Every day | ORAL | Status: AC

## 2022-05-28 MED ORDER — OXYCODONE HCL 5 MG PO TABS
5.0000 mg | ORAL_TABLET | ORAL | 0 refills | Status: DC | PRN
  Filled 2022-05-28: qty 28, 5d supply, fill #0

## 2022-05-28 MED ORDER — MIDODRINE HCL 5 MG PO TABS
5.0000 mg | ORAL_TABLET | Freq: Three times a day (TID) | ORAL | 3 refills | Status: DC
  Filled 2022-05-28: qty 30, 10d supply, fill #0
  Filled 2022-06-07: qty 30, 10d supply, fill #1
  Filled 2022-06-19: qty 30, 10d supply, fill #2

## 2022-05-28 NOTE — Progress Notes (Signed)
CARDIAC REHAB PHASE I   PRE:  Rate/Rhythm: 96 NSR  BP:  Sitting: 112/64      SaO2: RA  MODE:  Ambulation: 850 ft   POST:  Rate/Rhythm: 108 ST  BP:  Sitting: 114/72      SaO2: RA  Pt got out of bed independently following sternal precautions and was ambulated in the hallways using standby assistance. Pt was asymptomatic and returned to room w/o c/p. Pt was educated on restrictions, sternal precautions, and CRPII before d/c.   Amy Terry  10:36 AM 05/28/2022

## 2022-05-28 NOTE — Progress Notes (Addendum)
      [address]       [place] [address]             [phone]       7 Days Post-Op Procedure(s) (LRB): AORTIC VALVE REPLACEMENT (AVR) USING A 21MM INSPIRIS TISSUE VALVE (N/A) REPLACEMENT ASCENDING AORTA USING A 26 X 10MM HEMASHIELD AND AND A 24MM X 30CM HEMASHEILD GRAFT (N/A) TRANSESOPHAGEAL ECHOCARDIOGRAM (TEE) (N/A) Subjective: Patient feels she is ready to go home, complains of some constipation but had a bowel movement this morning.  Objective: Vital signs in last 24 hours: Temp:  [98 F (36.7 C)-98.5 F (36.9 C)] 98.2 F (36.8 C) (10/17 0546) Pulse Rate:  [91-100] 94 (10/17 0546) Cardiac Rhythm: Sinus tachycardia (10/16 1902) Resp:  [15-20] 20 (10/17 0546) BP: (93-117)/(58-79) 93/58 (10/17 0546) SpO2:  [93 %-96 %] 95 % (10/17 0546) Weight:  [64.2 kg] 64.2 kg (10/17 0546)  Hemodynamic parameters for last 24 hours:    Intake/Output from previous day: 10/16 0701 - 10/17 0700 In: 720 [P.O.:720] Out: -  Intake/Output this shift: No intake/output data recorded.  General appearance: alert, cooperative, and no distress Neurologic: intact Heart: NSR-ST, no murmur, rub, or gallop Lungs: clear to auscultation bilaterally Abdomen: soft, non-tender; bowel sounds normal; no masses,  no organomegaly Extremities: extremities normal, atraumatic, no cyanosis or edema Wound: Clean, dry, intact  Lab Results: Recent Labs    05/25/22 0750 05/26/22 0132  WBC 8.0 7.7  HGB 10.3* 10.9*  HCT 31.5* 32.3*  PLT 174 214   BMET:  Recent Labs    05/25/22 0750 05/26/22 0132  NA 140 137  K 4.0 3.8  CL 108 104  CO2 27 27  GLUCOSE 99 96  BUN 5* 10  CREATININE 0.53 0.86  CALCIUM 8.5* 8.4*    PT/INR: No results for input(s): "LABPROT", "INR" in the last 72 hours. ABG    Component Value Date/Time   PHART 7.344 (L) 05/21/2022 2026   HCO3 21.6 05/21/2022 2026   TCO2 23 05/21/2022 2026   ACIDBASEDEF 4.0 (H) 05/21/2022 2026   O2SAT 99 05/21/2022 2026    CBG (last 3)  No results for input(s): "GLUCAP" in the last 72 hours.  Assessment/Plan: S/P Procedure(s) (LRB): AORTIC VALVE REPLACEMENT (AVR) USING A 21MM INSPIRIS TISSUE VALVE (N/A) REPLACEMENT ASCENDING AORTA USING A 26 X 10MM HEMASHIELD AND AND A 24MM X 30CM HEMASHEILD GRAFT (N/A) TRANSESOPHAGEAL ECHOCARDIOGRAM (TEE) (N/A)  CV: NSR-ST, HR 102 this AM. Soft BP, SBP 93 this AM. On midodrine '5mg'$  TID. No dizziness or orthostatic hypotension. Pulm: Saturating 95% on RA, ambulating well. Continue IS and ambulation. GI: Good appetite, +BM. Some constipation, recommend OTC fiber supplement at d/c. Renal: At preop weight, cr 0.86. Volume overload resolved, does not require diuresis. ID: Wounds healing well, no sign of infection. Dispo: d/c to home today   LOS: 7 days    Amy River, PA-C 05/28/2022   Chart reviewed, patient examined, agree with above. She looks good. BP ok and maintaining sinus rhythm. Plan to send home today on midodrine and will wean off if BP ok when we see her back next week.

## 2022-05-28 NOTE — TOC Transition Note (Signed)
Transition of Care (TOC) - CM/SW Discharge Note Marvetta Gibbons RN, BSN Transitions of Care Unit 4E- RN Case Manager See Treatment Team for direct phone #    Patient Details  Name: Amy Terry MRN: [id number] Date of Birth: October 18, 1970  [facility] CM/SW Contact:  Dawayne Patricia, RN Phone Number: 05/28/2022, 12:12 PM   Clinical Narrative:    Pt stable for transition home today, from home w/ family supports. Transition of Care Department [facility]) has reviewed patient and no TOC needs have been identified at this time.  PCP- Bernita Raisin- family Medication barriers-  none identified   Final next level of care: Home/Self Care Barriers to Discharge: No Barriers Identified   Patient Goals and CMS Choice     Choice offered to / list presented to : NA  Discharge Placement                 Home      Discharge Plan and Services                DME Arranged: N/A DME Agency: NA       HH Arranged: NA HH Agency: NA        Social Determinants of Health (SDOH) Interventions     Readmission Risk Interventions     No data to display

## 2022-05-30 ENCOUNTER — Ambulatory Visit (INDEPENDENT_AMBULATORY_CARE_PROVIDER_SITE_OTHER): Payer: Self-pay

## 2022-05-30 DIAGNOSIS — Z4802 Encounter for removal of sutures: Secondary | ICD-10-CM

## 2022-05-30 NOTE — Progress Notes (Signed)
Patient arrived for nurse visit to remove suture/staples post- procedure AVR with Dr. [name] 10/10.  Three Sutures removed with no signs/ symptoms of infection noted.  Patient tolerated procedure well.  Incisions well approximated. Patient/ family instructed to keep the incision sites clean and dry.  Patient/ family acknowledged instructions given.

## 2022-06-03 ENCOUNTER — Other Ambulatory Visit (HOSPITAL_BASED_OUTPATIENT_CLINIC_OR_DEPARTMENT_OTHER): Payer: Self-pay

## 2022-06-03 DIAGNOSIS — J343 Hypertrophy of nasal turbinates: Secondary | ICD-10-CM | POA: Diagnosis not present

## 2022-06-03 DIAGNOSIS — E041 Nontoxic single thyroid nodule: Secondary | ICD-10-CM | POA: Diagnosis not present

## 2022-06-03 DIAGNOSIS — J309 Allergic rhinitis, unspecified: Secondary | ICD-10-CM | POA: Diagnosis not present

## 2022-06-03 MED ORDER — FLUTICASONE PROPIONATE 50 MCG/ACT NA SUSP
2.0000 | Freq: Every day | NASAL | 11 refills | Status: DC
  Filled 2022-06-03: qty 16, 30d supply, fill #0
  Filled 2022-09-09: qty 16, 30d supply, fill #1
  Filled 2022-11-06: qty 16, 30d supply, fill #2
  Filled 2022-12-16: qty 16, 30d supply, fill #3

## 2022-06-07 ENCOUNTER — Encounter: Payer: Self-pay | Admitting: Family

## 2022-06-07 ENCOUNTER — Other Ambulatory Visit (HOSPITAL_BASED_OUTPATIENT_CLINIC_OR_DEPARTMENT_OTHER): Payer: Self-pay

## 2022-06-11 NOTE — Progress Notes (Unsigned)
Cardiology Office Note:    Date:  06/12/2022   ID:  Amy Terry, DOB March 16, 1971, MRN [id number]  PCP:  [name], MD   [facility] Providers Cardiologist:  [name], MD     Referring MD: [name], MD   Chief Complaint: severe aortic stenosis  History of Present Illness:    Amy Terry is a very pleasant 51 y.o. female with a hx of palpitations, bicuspid aortic valve, and aortic stenosis.   She established care with Dr. Johnsie Cancel in 2016 for palpitations and dizziness.  She was found to have a bicuspid aortic valve on echo.  She returned on 01/25/2022.  Long history of iron deficiency anemia with ferritin only 2.  Has not been responsive to oral iron.  Seen by hematology 12/31/21 and set up for IV iron infusion.  She had repeat echocardiogram which revealed EF normal 55 to 60%, bicuspid AV with severe AS, mean gradient 40 peak 63 mmHg. Dr. Johnsie Cancel discussed the need for catheterization and referral to CVTS for symptomatic severe AS.  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  She underwent right and left heart catheterization on 01/31/22.  Appointment with Dr. [name], [facility], on 6/26.  Seen by me in clinic on 02/05/22. Met with Dr. [name], cardiac surgeon, the day prior. He recommended aortic valve replacement within the next 6 months. She is considering mechanical versus tissue valve. Having short episodes of palpitations that have not worsened recently. These occur randomly and last for a few seconds.  She denied chest pain, shortness of breath, lower extremity edema, fatigue, melena, hematuria, hemoptysis, diaphoresis, weakness, presyncope, syncope, orthopnea, and PND.  Was planning to travel to the [place] in July.  Was encouraged by Dr. [name] to postpone the trip.  I reached out to his coordinator who advised that they will provide a letter for her airline for  possible reimbursement.   Seen in follow-up 04/24/22 by Ambrose Pancoast, NP for shortness of breath.  She reported chest discomfort and shortness of breath occurring for approximately 1 minute and associated with flutters.  Similar to her previous complaint at appointment in June.  Scheduled for AV replacement on 05/21/2022. Conservative management was agreed upon through shared decision making.   Office visit 05/13/22 with Dr. Johnsie Cancel at which time she endorsed continued shortness of breath and palpitations.  Symptoms felt to be 2/2 severe AAS.  Leg pain felt to be likely to venous engorgement on right with previous varicosities. Good pulses and normal ABIs 07/12/2019.  Pre-CABG Dopplers pending.  She underwent aortic valve replacement on 05/21/2022 with a 21 mm Inspiris bioprosthetic valve and replacement of the ascending aorta with 24 mm and 26 mm Hemashield grafts.  She had soft BP and therefore BP was not started. No major postop complications. Discharged on 05/28/2022.  Today, she is here with her husband for follow-up.  Reports she is overall doing well and is increasing her activity. Is walking for up to 20 minutes a day. Feels her pulse in her carotid arteries at times, not just at rest.  Appetite remains somewhat poor. Mild chest tenderness, appropriate for her stage of healing.  She denies shortness of breath, palpitations, presyncope, syncope, orthopnea, and PND.  No bleeding concerns. Surgical incisions in appropriate stage of healing.   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   Sinusitis    Thyroid nodule    UTI (urinary tract infection)     Past Surgical History:  Procedure  Laterality Date   AORTIC VALVE REPLACEMENT N/A 05/21/2022   Procedure: AORTIC VALVE REPLACEMENT (AVR) USING A 21MM INSPIRIS TISSUE VALVE;  Surgeon: [name], MD;  Location: [facility];  Service: Open Heart Surgery;  Laterality: N/A;   CESAREAN SECTION     REPLACEMENT ASCENDING AORTA N/A 05/21/2022   Procedure: REPLACEMENT ASCENDING AORTA USING A 26 X 10MM HEMASHIELD AND AND A 24MM X 30CM HEMASHEILD GRAFT;  Surgeon: [name], MD;  Location: [facility];  Service: Open Heart Surgery;  Laterality: N/A;   RIGHT HEART CATH AND CORONARY ANGIOGRAPHY N/A 01/31/2022   Procedure: RIGHT HEART CATH AND CORONARY ANGIOGRAPHY;  Surgeon: Early Osmond, MD;  Location: [facility];  Service: Cardiovascular;  Laterality: N/A;   TEE WITHOUT CARDIOVERSION N/A 05/21/2022   Procedure: TRANSESOPHAGEAL ECHOCARDIOGRAM (TEE);  Surgeon: [name], MD;  Location: [facility];  Service: Open Heart Surgery;  Laterality: N/A;   TUBAL LIGATION  2007   VARICOSE VEIN SURGERY Right 2015    Current Medications: Current Meds  Medication Sig   acetaminophen (TYLENOL) 500 MG tablet Take 1,000 mg by mouth every 6 (six) hours as needed for headache or moderate pain.   aspirin EC 325 MG tablet Take 1 tablet (325 mg total) by mouth daily.   docusate sodium (COLACE) 100 MG capsule Take 100 mg by mouth 2 (two) times daily.   fluticasone (FLONASE) 50 MCG/ACT nasal spray Place 1 spray into both nostrils daily as needed for allergies or rhinitis.   midodrine (PROAMATINE) 5 MG tablet Take 1 tablet (5 mg total) by mouth 3 (three) times daily with meals.   oxyCODONE (OXY IR/ROXICODONE) 5 MG immediate release tablet Take 1 tablet (5 mg total) by mouth every 4 (four) hours as needed for severe pain.     Allergies:   Doxycycline, Ivp dye [iodinated contrast media], and Other   Social History   Socioeconomic History   Marital status: Married    Spouse name: Not on file   Number of children: Not on file   Years of education:  Not on file   Highest education level: Not on file  Occupational History   Not on file  Tobacco Use   Smoking status: Never   Smokeless tobacco: Never  Vaping Use   Vaping Use: Never used  Substance and Sexual Activity   Alcohol use: No    Alcohol/week: 0.0 standard drinks of alcohol   Drug use: No   Sexual activity: Yes    Partners: Male  Other Topics Concern   Not on file  Social History Narrative   Not on file   Social Determinants of Health   Financial Resource Strain: Not on file  Food Insecurity: No Food Insecurity (05/26/2022)   Hunger Vital Sign    Worried About Running Out of Food in the Last Year: Never true    Ran Out of Food in the Last Year: Never true  Transportation Needs: No Transportation Needs (05/26/2022)   PRAPARE -  Hydrologist (Medical): No    Lack of Transportation (Non-Medical): No  Physical Activity: Not on file  Stress: Not on file  Social Connections: Not on file     Family History: The patient's family history includes Anemia in her sister; Asthma in her sister; Breast cancer in her sister; Emphysema in her mother; Hyperlipidemia in her brother; Hypertension in her father and mother; Thyroid disease in her paternal aunt, sister, sister, and sister.  ROS:   Please see the history of present illness.   + occasional palpitations All other systems reviewed and are negative.  Labs/Other Studies Reviewed:    The following studies were reviewed today:  [facility] 01/31/22  1.  Normal right dominant circulation 2.  Cardiac output of 6.3 L/min and cardiac index of 3.8 L/min/m with relatively low filling pressures.   Recommendation: Cardiothoracic surgical consultation for surgical aortic valve replacement.  Echo 01/18/22   1. Left ventricular ejection fraction, by estimation, is 55 to 60%. The  left ventricle has normal function. The left ventricle has no regional  wall motion abnormalities. Left ventricular diastolic  parameters were  normal.   2. Right ventricular systolic function is normal. The right ventricular  size is normal. There is normal pulmonary artery systolic pressure. The  estimated right ventricular systolic pressure is 40.0 mmHg.   3. The mitral valve is normal in structure. Trivial mitral valve  regurgitation. No evidence of mitral stenosis.   4. Tricuspid valve regurgitation is mild to moderate.   5. The aortic valve is abnormal. Unable to determine aortic valve  morphology due to image quality on this study, however previously  described as bicuspid. Review of prior studies suggests calcified raphe  and fusion of left and non-coronary cusps. Aortic   valve regurgitation is trivial. Severe aortic valve stenosis. Aortic  valve mean gradient measures 40.0 mmHg. Aortic valve Vmax measures 3.97  m/s. Valve area 0.51 cm2 but may be underestimated due to low LVOT TVI.   6. The inferior vena cava is normal in size with greater than 50%  respiratory variability, suggesting right atrial pressure of 3 mmHg.   Comparison(s): A prior study was performed on 07/15/2019. Changes from  prior study are noted. BAV AS has progressed to severe stenosis.    [facility] 07/06/18  Normal right dominant coronary arteries   Calcium score 0   Normal aortic root diameter 3.6 cm   Bicuspid Aortic Valve  Recent Labs: 12/24/2021: TSH 0.97 05/17/2022: ALT 27 05/22/2022: Magnesium 2.8 05/26/2022: BUN 10; Creatinine, Ser 0.86; Hemoglobin 10.9; Platelets 214; Potassium 3.8; Sodium 137  Recent Lipid Panel    Component Value Date/Time   CHOL 170 12/24/2021 1055   TRIG 52.0 12/24/2021 1055   HDL 75.10 12/24/2021 1055   CHOLHDL 2 12/24/2021 1055   VLDL 10.4 12/24/2021 1055   LDLCALC 85 12/24/2021 1055     Risk Assessment/Calculations:      Physical Exam:    VS:  BP 112/64   Pulse 81   Ht _0  (1.575 m)   Wt 142 lb 9.6 oz (64.7 kg)   LMP 05/04/2022   SpO2 98%   BMI 26.08 kg/m     Wt Readings from  Last 3 Encounters:  06/12/22 142 lb 9.6 oz (64.7 kg)  05/28/22 141 lb 8 oz (64.2 kg)  05/17/22 141 lb 9.6 oz (64.2 kg)     GEN:  Well nourished, well developed in no acute distress HEENT: Normal NECK: No JVD;  No carotid bruits CARDIAC: RRR, no murmurs, rubs, gallops RESPIRATORY:  Clear to auscultation without rales, wheezing or rhonchi  ABDOMEN: Sternotomy and chest tube sites well-approximated, no erythema, no drainage. Abdomen is soft, non-tender, non-distended MUSCULOSKELETAL:  No edema; No deformity. 2+ pedal pulses, equal bilaterally SKIN: Warm and dry NEUROLOGIC:  Alert and oriented x 3 PSYCHIATRIC:  Normal affect   EKG:  EKG is ordered today. EKG reveals normal sinus rhythm at 81 bpm, rightward axis, no acute change from previous tracing  Diagnoses:    1. Bicuspid aortic valve   2. S/P AVR (aortic valve replacement)   3. Postprocedural hypotension     Assessment and Plan:     Bicuspid aortic valve/Severe AS/s/p AVR: Aortic stenosis severe on echo 01/18/22 with mean gradient 40 mmHg. Right heart cath ordered which revealed normal right dominant circulation with relatively low filling pressures. Referred to [facility] for discussion of aortic valve replacement and seen by Dr. [name] on 6/26.  She became more symptomatic in September and underwent AVR with 21 mm Inspiris bioprosthetic valve in the ascending aorta was replaced with 24 mm and 26 mm Hemashield grafts. Had hypotension post-procedure and was started on midodrine. BB therapy not initiated. Continues to have hypotension.  Advised her to continue midodrine.  Has follow-up echocardiogram on 11/16.  We will see her back a few days after that.  Hypotension: BP is soft today. Home SBP 93-107 on midodrine 5 mg TID. Reports normal SBP for her preprocedure was 90s. She is walking for up to 20 minutes daily, but is experiencing some symptoms of orthostasis at other times. Appetite remains somewhat diminished. Encouraged her to increase po  intake and continue midodrine. No evidence of bleeding but we will recheck CBC and BMET today.  Disposition: Keep appointment with Dr. Johnsie Cancel on 11/21   Medication Adjustments/Labs and Tests Ordered: Current medicines are reviewed at length with the patient today.  Concerns regarding medicines are outlined above.  Orders Placed This Encounter  Procedures   CBC   Basic Metabolic Panel (BMET)   EKG 12-Lead   No orders of the defined types were placed in this encounter.   Patient Instructions  Medication Instructions:   Your physician recommends that you continue on your current medications as directed. Please refer to the Current Medication list given to you today.   *If you need a refill on your cardiac medications before your next appointment, please call your pharmacy*   Lab Work:  TODAY!!!! CBC/BMET.  If you have labs (blood work) drawn today and your tests are completely normal, you will receive your results only by: [place] (if you have MyChart) OR A paper copy in the mail If you have any lab test that is abnormal or we need to change your treatment, we will call you to review the results.   Testing/Procedures:  Your physician has requested that you have an echocardiogram. Echocardiography is a painless test that uses sound waves to create images of your heart. It provides your doctor with information about the size and shape of your heart and how well your heart's chambers and valves are working. This procedure takes approximately one hour. There are no restrictions for this procedure. Please do NOT wear cologne, perfume, aftershave, or lotions (deodorant is allowed). Please arrive 15 minutes prior to your appointment time.   Follow-Up: At [facility], you and your health needs are our priority.  As part of our continuing mission to provide you with exceptional heart care,  we have created designated Provider Care Teams.  These Care Teams include your  primary Cardiologist (physician) and Advanced Practice Providers (APPs -  Physician Assistants and Nurse Practitioners) who all work together to provide you with the care you need, when you need it.  We recommend signing up for the patient portal called "MyChart".  Sign up information is provided on this After Visit Summary.  MyChart is used to connect with patients for Virtual Visits (Telemedicine).  Patients are able to view lab/test results, encounter notes, upcoming appointments, etc.  Non-urgent messages can be sent to your provider as well.   To learn more about what you can do with MyChart, go to [email or web address].    Your next appointment:   3 week(s)  The format for your next appointment:   In Person  Provider:   [name], MD     Important Information About Sugar         Signed, Emmaline Life, NP  06/12/2022 5:24 PM    [place]

## 2022-06-12 ENCOUNTER — Ambulatory Visit: Payer: 59 | Attending: Nurse Practitioner | Admitting: Nurse Practitioner

## 2022-06-12 ENCOUNTER — Encounter: Payer: Self-pay | Admitting: Nurse Practitioner

## 2022-06-12 VITALS — BP 112/64 | HR 81 | Ht 62.0 in | Wt 142.6 lb

## 2022-06-12 DIAGNOSIS — I9581 Postprocedural hypotension: Secondary | ICD-10-CM

## 2022-06-12 DIAGNOSIS — Z952 Presence of prosthetic heart valve: Secondary | ICD-10-CM | POA: Diagnosis not present

## 2022-06-12 DIAGNOSIS — Q231 Congenital insufficiency of aortic valve: Secondary | ICD-10-CM

## 2022-06-12 DIAGNOSIS — I350 Nonrheumatic aortic (valve) stenosis: Secondary | ICD-10-CM | POA: Diagnosis not present

## 2022-06-12 DIAGNOSIS — Q2381 Bicuspid aortic valve: Secondary | ICD-10-CM

## 2022-06-12 NOTE — Patient Instructions (Signed)
Medication Instructions:   Your physician recommends that you continue on your current medications as directed. Please refer to the Current Medication list given to you today.   *If you need a refill on your cardiac medications before your next appointment, please call your pharmacy*   Lab Work:  TODAY!!!! CBC/BMET.  If you have labs (blood work) drawn today and your tests are completely normal, you will receive your results only by: [place] (if you have MyChart) OR A paper copy in the mail If you have any lab test that is abnormal or we need to change your treatment, we will call you to review the results.   Testing/Procedures:  Your physician has requested that you have an echocardiogram. Echocardiography is a painless test that uses sound waves to create images of your heart. It provides your doctor with information about the size and shape of your heart and how well your heart's chambers and valves are working. This procedure takes approximately one hour. There are no restrictions for this procedure. Please do NOT wear cologne, perfume, aftershave, or lotions (deodorant is allowed). Please arrive 15 minutes prior to your appointment time.   Follow-Up: At [facility], you and your health needs are our priority.  As part of our continuing mission to provide you with exceptional heart care, we have created designated Provider Care Teams.  These Care Teams include your primary Cardiologist (physician) and Advanced Practice Providers (APPs -  Physician Assistants and Nurse Practitioners) who all work together to provide you with the care you need, when you need it.  We recommend signing up for the patient portal called "MyChart".  Sign up information is provided on this After Visit Summary.  MyChart is used to connect with patients for Virtual Visits (Telemedicine).  Patients are able to view lab/test results, encounter notes, upcoming appointments, etc.  Non-urgent  messages can be sent to your provider as well.   To learn more about what you can do with MyChart, go to [email or web address].    Your next appointment:   3 week(s)  The format for your next appointment:   In Person  Provider:   [name], MD     Important Information About Sugar

## 2022-06-13 LAB — CBC
Hematocrit: 37.9 % (ref 34.0–46.6)
Hemoglobin: 11.7 g/dL (ref 11.1–15.9)
MCH: 27.8 pg (ref 26.6–33.0)
MCHC: 30.9 g/dL — ABNORMAL LOW (ref 31.5–35.7)
MCV: 90 fL (ref 79–97)
Platelets: 418 10*3/uL (ref 150–450)
RBC: 4.21 x10E6/uL (ref 3.77–5.28)
RDW: 13.2 % (ref 11.7–15.4)
WBC: 7.6 10*3/uL (ref 3.4–10.8)

## 2022-06-13 LAB — BASIC METABOLIC PANEL
BUN/Creatinine Ratio: 21 (ref 9–23)
BUN: 12 mg/dL (ref 6–24)
CO2: 26 mmol/L (ref 20–29)
Calcium: 9.5 mg/dL (ref 8.7–10.2)
Chloride: 102 mmol/L (ref 96–106)
Creatinine, Ser: 0.56 mg/dL — ABNORMAL LOW (ref 0.57–1.00)
Glucose: 91 mg/dL (ref 70–99)
Potassium: 4.2 mmol/L (ref 3.5–5.2)
Sodium: 140 mmol/L (ref 134–144)
eGFR: 110 mL/min/{1.73_m2} (ref >59)

## 2022-06-18 ENCOUNTER — Other Ambulatory Visit: Payer: Self-pay | Admitting: Surgery

## 2022-06-18 DIAGNOSIS — Z952 Presence of prosthetic heart valve: Secondary | ICD-10-CM

## 2022-06-19 ENCOUNTER — Other Ambulatory Visit (HOSPITAL_BASED_OUTPATIENT_CLINIC_OR_DEPARTMENT_OTHER): Payer: Self-pay

## 2022-06-19 ENCOUNTER — Ambulatory Visit (INDEPENDENT_AMBULATORY_CARE_PROVIDER_SITE_OTHER): Payer: Self-pay | Admitting: Surgery

## 2022-06-19 ENCOUNTER — Ambulatory Visit
Admission: RE | Admit: 2022-06-19 | Discharge: 2022-06-19 | Disposition: A | Discharge status: Home / Self Care | Payer: 59 | Source: Ambulatory Visit | Attending: Surgery | Admitting: Surgery

## 2022-06-19 ENCOUNTER — Encounter: Payer: Self-pay | Admitting: Surgery

## 2022-06-19 VITALS — BP 110/72 | HR 86 | Resp 20 | Ht 62.0 in | Wt 142.0 lb

## 2022-06-19 DIAGNOSIS — J9811 Atelectasis: Secondary | ICD-10-CM | POA: Diagnosis not present

## 2022-06-19 DIAGNOSIS — Q230 Congenital stenosis of aortic valve: Secondary | ICD-10-CM

## 2022-06-19 DIAGNOSIS — Q231 Congenital insufficiency of aortic valve: Secondary | ICD-10-CM

## 2022-06-19 DIAGNOSIS — Z952 Presence of prosthetic heart valve: Secondary | ICD-10-CM

## 2022-06-19 NOTE — Progress Notes (Addendum)
HPI: Patient returns for routine postoperative follow-up having undergone supra-coronary replacement of the ascending aorta under deep hypothermic circulatory arrest and aortic valve replacement with a 21 mm Edwards INSPIRIS RESILIA pericardial valve on 05/21/2022. The patient's early postoperative recovery while in the hospital was notable for an uncomplicated postoperative course.  She was started on midodrine due to orthostatic hypotension.. Since hospital discharge the patient reports that she has been progressing well.  She is taking the midodrine 5 mg 3 times per day but sometimes twice a day if she forgets.  She denies any dizziness.  Her preoperative systolic blood pressure was in the 90s.  She reports that she still has some exertional shortness of breath but it is improving and her stamina is getting better.   Current Outpatient Medications  Medication Sig Dispense Refill   acetaminophen (TYLENOL) 500 MG tablet Take 1,000 mg by mouth every 6 (six) hours as needed for headache or moderate pain.     aspirin EC 325 MG tablet Take 1 tablet (325 mg total) by mouth daily.     docusate sodium (COLACE) 100 MG capsule Take 100 mg by mouth 2 (two) times daily.     fluticasone (FLONASE) 50 MCG/ACT nasal spray Place 1 spray into both nostrils daily as needed for allergies or rhinitis.     midodrine (PROAMATINE) 5 MG tablet Take 1 tablet (5 mg total) by mouth 3 (three) times daily with meals. 30 tablet 3   oxyCODONE (OXY IR/ROXICODONE) 5 MG immediate release tablet Take 1 tablet (5 mg total) by mouth every 4 (four) hours as needed for severe pain. 28 tablet 0   No current facility-administered medications for this visit.    Physical Exam: BP 110/72   Pulse 86   Resp 20 Comment: RA  Ht '5\' 2"'$  (1.575 m)   Wt 142 lb (64.4 kg)   SpO2 97%   BMI 25.97 kg/m  She looks well. Cardiac exam shows a regular rate and rhythm with normal heart sounds.  There is no murmur. Lungs are clear. The chest  incision is healing well and the sternum is stable. There is no peripheral edema.  Diagnostic Tests:  Narrative & Impression  CLINICAL DATA:  Follow-up aortic valve replacement   EXAM: CHEST - 2 VIEW   COMPARISON:  05/26/2022   FINDINGS: Previous median sternotomy and AVR. Heart size is normal. Mediastinal shadows are otherwise normal. Resolution of bilateral effusions and lower lung atelectasis. The lungs are now clear. No residual pneumothorax on the left. No acute bone finding.   IMPRESSION: Previous median sternotomy and AVR. Resolution of bilateral effusions and lower lung atelectasis. No residual pneumothorax on the left.     Electronically Signed   By: Nelson Chimes M.D.   On: 06/19/2022 11:07      Impression:  Overall I think she is doing very well 1 month postoperatively.  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  Her blood pressure is fine today.  I asked her to decrease the midodrine to 5 mg twice daily for 1 week and then 5 mg daily for 1 week before discontinuing it.  She will check her blood pressure at home and as long as she is walking without dizziness or orthostasis she should be fine off midodrine.  She saw cardiology in follow-up recently and her hemoglobin on 06/12/2022 was up to 11.7 which is about her baseline.  Her electrolytes and kidney function were normal.  Plan:  I will plan to see her back the first week of December for follow-up.  She will continue to wean off midodrine as tolerated.  She has a follow-up echocardiogram scheduled for 06/27/2022.  I think she can begin outpatient cardiac rehabilitation at any time.   [name], MD [facility] [phone]

## 2022-06-24 NOTE — Progress Notes (Signed)
Cardiology Office Note   Date:  07/02/2022   ID:  Amy Terry, DOB 05/09/1971, MRN [id number]   PCP:  [name], MD  Cardiologist:  Dr Johnsie Cancel    No chief complaint on file.     History of Present Illness: Amy Terry is a 51 y.o. female F/U post AVR      07/06/18 Cardiac CTA: Aortic root 3.6 cm Calcium score 0 normal right dominant coronary arteries    Long history of iron deficiency anemia with ferritin only 2 Has not been responsive to oral iron Seen by hematology 12/31/21 set up for iv iron Hb 8.6 and has not had recent EGD/Colon  TTE 01/18/22 progressive AS in setting bicuspid valve EF 55-60% mean gradient 40 peak 63 mmHg  Post op TTE done 06/27/22 reviewed EF 55-60% no AR mean gradient 9 peak 16 mmHg AVA 1.7 cm2 DVI 0.48  Cath 01/31/22 Normal right dominant cors  Dr [name] AVR 05/21/22 21 mm Edwards Inspiris Resilia pericardial valve with 26 mm Hemashield graft and 24 mm interposition graft Some orthostasis post op d/c with midodrine now taking bid   She is still working as a Marine scientist on [employer]  Has a 56 and 51 yo at home Will return to work after the holidays       Past Medical History:  Diagnosis Date   Allergy    Anemia    Chicken pox    Complication of anesthesia    hypotensive post-op   Congenital nasal septum deviation    COVID 06/2021   Headache    Heart murmur    History of tonsillitis    Hypoglycemic disorder    Hx passing out with hypoglycemic   Pneumonia    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   Sinusitis    Thyroid nodule    UTI (urinary tract infection)     Past Surgical History:  Procedure Laterality Date   AORTIC VALVE REPLACEMENT N/A 05/21/2022   Procedure: AORTIC VALVE REPLACEMENT (AVR) USING A 21MM INSPIRIS TISSUE VALVE;  Surgeon: [name], MD;  Location: [facility];  Service: Open Heart Surgery;  Laterality: N/A;   CESAREAN SECTION     REPLACEMENT ASCENDING AORTA N/A 05/21/2022   Procedure: REPLACEMENT ASCENDING AORTA  USING A 26 X 10MM HEMASHIELD AND AND A 24MM X 30CM HEMASHEILD GRAFT;  Surgeon: [name], MD;  Location: [facility];  Service: Open Heart Surgery;  Laterality: N/A;   RIGHT HEART CATH AND CORONARY ANGIOGRAPHY N/A 01/31/2022   Procedure: RIGHT HEART CATH AND CORONARY ANGIOGRAPHY;  Surgeon: Early Osmond, MD;  Location: [facility];  Service: Cardiovascular;  Laterality: N/A;   TEE WITHOUT CARDIOVERSION N/A 05/21/2022   Procedure: TRANSESOPHAGEAL ECHOCARDIOGRAM (TEE);  Surgeon: [name], MD;  Location: [facility];  Service: Open Heart Surgery;  Laterality: N/A;   TUBAL LIGATION  2007   VARICOSE VEIN SURGERY Right 2015     Current Outpatient Medications  Medication Sig Dispense Refill   acetaminophen (TYLENOL) 500 MG tablet Take 1,000 mg by mouth every 6 (six) hours as needed for headache or moderate pain.     aspirin EC 325 MG tablet Take 1 tablet (325 mg total) by mouth daily.     docusate sodium (COLACE) 100 MG capsule Take 100 mg by mouth 2 (two) times daily.     fluticasone (FLONASE) 50 MCG/ACT nasal spray Place 1 spray into both nostrils daily as needed for allergies or  rhinitis.     midodrine (PROAMATINE) 5 MG tablet Take 1 tablet (5 mg total) by mouth 3 (three) times daily with meals. (Patient taking differently: Take 5 mg by mouth daily.) 30 tablet 3   oxyCODONE (OXY IR/ROXICODONE) 5 MG immediate release tablet Take 1 tablet (5 mg total) by mouth every 4 (four) hours as needed for severe pain. 28 tablet 0   No current facility-administered medications for this visit.    Allergies:   Doxycycline, Ivp dye [iodinated contrast media], and Other    Social History:  The patient  reports that she has never smoked. She has never used smokeless tobacco. She reports that she does not drink alcohol and does not use drugs.   Family History:  The patient's family history includes Anemia in her sister; Asthma in her sister; Breast cancer in her sister; Emphysema in her mother;  Hyperlipidemia in her brother; Hypertension in her father and mother; Thyroid disease in her paternal aunt, sister, sister, and sister.    ROS:  General:no colds or fevers, no weight changes Skin:no rashes or ulcers HEENT:no blurred vision, no congestion CV:see HPI PUL:see HPI GI:no diarrhea constipation or melena, no indigestion GU:no hematuria, no dysuria MS:no joint pain, no claudication Neuro:no syncope, no lightheadedness Endo:no diabetes, no thyroid disease  Wt Readings from Last 3 Encounters:  06/19/22 142 lb (64.4 kg)  06/12/22 142 lb 9.6 oz (64.7 kg)  05/28/22 141 lb 8 oz (64.2 kg)     PHYSICAL EXAM: VS:  There were no vitals taken for this visit. , BMI There is no height or weight on file to calculate BMI.  Affect appropriate Healthy:  appears stated age 65: normal Neck supple with no adenopathy JVP normal no bruits no thyromegaly Lungs clear with no wheezing and good diaphragmatic motion Heart:  S1/S2 AS  murmur, no rub, gallop or click PMI normal Abdomen: benighn, BS positve, no tenderness, no AAA no bruit.  No HSM or HJR Distal pulses intact with no bruits No edema Neuro non-focal Skin warm and dry No muscular weakness     EKG:   SR no changes from previous.    Recent Labs: 12/24/2021: TSH 0.97 05/17/2022: ALT 27 05/22/2022: Magnesium 2.8 06/12/2022: BUN 12; Creatinine, Ser 0.56; Hemoglobin 11.7; Platelets 418; Potassium 4.2; Sodium 140    Lipid Panel    Component Value Date/Time   CHOL 170 12/24/2021 1055   TRIG 52.0 12/24/2021 1055   HDL 75.10 12/24/2021 1055   CHOLHDL 2 12/24/2021 1055   VLDL 10.4 12/24/2021 1055   LDLCALC 85 12/24/2021 1055       Other studies Reviewed:  Echo 06/05/18  Echo 04/20/22  Cardiac CTA 07/06/18   ASSESSMENT AND PLAN:  1.  Bicuspid Ao Valve : post AVR and root replacement 21 mm Inspiris pericardial valve due to history of anemia post op TTE normal EF and valve with no AR/AS  2.  palpitations  Benign no  arrhythmia on monitor   3. 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 Leg Pain:  Likely some venous engorgement on right with previous varicosities pulses good and ABI's normal 07/12/19 F  5. Thyroid :  Nodules on [place] 04/12/22 Biopsy 06/25/22 f/u with primary   6. Orthostasis:  On tid midodrine only 5 mg with meals    F/U Dr [name] CVTS F/U in 6 months    Signed,  [name], MD  07/02/2022 11:31 AM    [facility] [place] [place], [place] Phone: [phone]; Fax: [phone]

## 2022-06-25 ENCOUNTER — Ambulatory Visit
Admission: RE | Admit: 2022-06-25 | Discharge: 2022-06-25 | Disposition: A | Discharge status: Home / Self Care | Payer: 59 | Source: Ambulatory Visit | Attending: Surgery | Admitting: Surgery

## 2022-06-25 ENCOUNTER — Other Ambulatory Visit (HOSPITAL_COMMUNITY)
Admission: RE | Admit: 2022-06-25 | Discharge: 2022-06-25 | Disposition: A | Discharge status: Home / Self Care | Payer: 59 | Source: Ambulatory Visit | Attending: Surgery | Admitting: Surgery

## 2022-06-25 DIAGNOSIS — E041 Nontoxic single thyroid nodule: Secondary | ICD-10-CM | POA: Diagnosis not present

## 2022-06-27 ENCOUNTER — Ambulatory Visit (INDEPENDENT_AMBULATORY_CARE_PROVIDER_SITE_OTHER): Payer: 59

## 2022-06-27 ENCOUNTER — Ambulatory Visit (HOSPITAL_COMMUNITY): Payer: 59 | Attending: Cardiovascular Disease

## 2022-06-27 DIAGNOSIS — Z23 Encounter for immunization: Secondary | ICD-10-CM | POA: Diagnosis not present

## 2022-06-27 DIAGNOSIS — I350 Nonrheumatic aortic (valve) stenosis: Secondary | ICD-10-CM | POA: Insufficient documentation

## 2022-06-27 LAB — ECHOCARDIOGRAM COMPLETE
AR max vel: 1.65 cm2
AV Area VTI: 1.66 cm2
AV Area mean vel: 1.57 cm2
AV Mean grad: 9 mmHg
AV Peak grad: 16 mmHg
Ao pk vel: 2 m/s
Area-P 1/2: 3.42 cm2
S' Lateral: 2.5 cm

## 2022-06-27 LAB — CYTOLOGY - NON PAP

## 2022-07-02 ENCOUNTER — Ambulatory Visit: Payer: 59 | Attending: Cardiovascular Disease | Admitting: Cardiovascular Disease

## 2022-07-02 ENCOUNTER — Encounter: Payer: Self-pay | Admitting: Cardiovascular Disease

## 2022-07-02 VITALS — BP 102/72 | HR 92 | Ht 62.0 in | Wt 145.0 lb

## 2022-07-02 DIAGNOSIS — Z952 Presence of prosthetic heart valve: Secondary | ICD-10-CM

## 2022-07-02 NOTE — Patient Instructions (Addendum)
Medication Instructions:  No changes. *If you need a refill on your cardiac medications before your next appointment, please call your pharmacy*   Lab Work: None.  Testing/Procedures: None.   Follow-Up: At [facility], you and your health needs are our priority.  As part of our continuing mission to provide you with exceptional heart care, we have created designated Provider Care Teams.  These Care Teams include your primary Cardiologist (physician) and Advanced Practice Providers (APPs -  Physician Assistants and Nurse Practitioners) who all work together to provide you with the care you need, when you need it.   Your next appointment:   01/16/23 at 9:30 AM  The format for your next appointment:   In Person  Provider:   [name], MD      Important Information About [facility]

## 2022-07-03 ENCOUNTER — Other Ambulatory Visit (HOSPITAL_COMMUNITY): Payer: 59

## 2022-07-12 ENCOUNTER — Telehealth (HOSPITAL_COMMUNITY): Payer: Self-pay

## 2022-07-12 NOTE — Telephone Encounter (Signed)
Called patient to see if she was interested in participating in the Cardiac Rehab Program. Patient stated yes. Patient will come in for orientation on 07/16/22 @ 930AM and will attend the 07/22/22 @ 10:15AM exercise class.   Tourist information centre manager.

## 2022-07-12 NOTE — Telephone Encounter (Signed)
Pt insurance is active and benefits verified through [facility]. Co-pay $0.00, DED $350.00/$350.00 met, out of pocket $7,900.00/$7,900.00 met, co-insurance 40%. No pre-authorization required. Ryan/[facility], 07/12/22 @ 3:378PM, UGQ#[id number]   How many CR sessions are covered? (36 sessions for TCR, 72 sessions for ICR)72 Is this a lifetime maximum or an annual maximum? Annual Has the member used any of these services to date? No Is there a time limit (weeks/months) on start of program and/or program completion? No

## 2022-07-15 ENCOUNTER — Telehealth (HOSPITAL_COMMUNITY): Payer: Self-pay

## 2022-07-15 NOTE — Progress Notes (Signed)
RN called and confirmed appt for cardiac rehab with patient tomorrow, 12/5 at 9:30am. Pt will be attending the session. RN completed the "Cardiac Rehab Cardiac Risk Profile Nursing Assessment" with the patient. Information for the appointment reviewed, questions answered, pt understands without assistance.

## 2022-07-15 NOTE — Telephone Encounter (Signed)
RN called and confirmed appt for cardiac rehab with patient tomorrow, 12/5 at 9:30am. Pt will be attending the session. Information for the appointment reviewed and pt understands without assistance.

## 2022-07-16 ENCOUNTER — Encounter (HOSPITAL_COMMUNITY)
Admission: RE | Admit: 2022-07-16 | Discharge: 2022-07-16 | Disposition: A | Discharge status: Home / Self Care | Payer: 59 | Source: Ambulatory Visit | Attending: Cardiovascular Disease | Admitting: Cardiovascular Disease

## 2022-07-16 VITALS — BP 108/64 | HR 100 | Ht 63.5 in | Wt 145.3 lb

## 2022-07-16 DIAGNOSIS — Z952 Presence of prosthetic heart valve: Secondary | ICD-10-CM | POA: Diagnosis not present

## 2022-07-16 DIAGNOSIS — Z95828 Presence of other vascular implants and grafts: Secondary | ICD-10-CM

## 2022-07-16 NOTE — Progress Notes (Addendum)
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  Patient's Home Medications on Admission:  Current Outpatient Medications:    acetaminophen (TYLENOL) 500 MG tablet, Take 1,000 mg by mouth every 6 (six) hours as needed for headache or moderate pain., Disp: , Rfl:    aspirin EC 325 MG tablet, Take 1 tablet (325 mg total) by mouth daily., Disp: , Rfl:    docusate sodium (COLACE) 100 MG capsule, Take 200 mg by mouth daily., Disp: , Rfl:    fluticasone (FLONASE) 50 MCG/ACT nasal spray, Place 2 sprays into both nostrils daily as needed for allergies or rhinitis., Disp: , Rfl:    loratadine (CLARITIN) 10 MG tablet, Take 10 mg by mouth daily as needed for allergies., Disp: , Rfl:    oxyCODONE (OXY IR/ROXICODONE) 5 MG immediate release tablet, Take 1 tablet (5 mg total) by mouth every 4 (four) hours as needed for severe pain., Disp: 28 tablet, Rfl: 0   midodrine (PROAMATINE) 5 MG tablet, Take 1 tablet (5 mg total) by mouth 3 (three) times daily with meals. 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   Sinusitis    Thyroid nodule    UTI (urinary tract infection)     Tobacco Use: Social History   Tobacco Use  Smoking Status Never  Smokeless Tobacco Never    Labs: Review Flowsheet  More data exists      Latest Ref Rng & Units 06/24/2019 12/24/2021 01/31/2022 05/17/2022 05/21/2022  Labs for ITP Cardiac and Pulmonary Rehab  Cholestrol 0 - 200 mg/dL 180  170  - - -  LDL (calc) 0 - 99 mg/dL 111  85  - - -  HDL-C >39.00 mg/dL 59.60  75.10  - - -  Trlycerides 0.0 - 149.0 mg/dL 45.0  52.0  - - -  Hemoglobin A1c 4.8 - 5.6 % 5.3  5.4  - 5.2  -  PH, Arterial 7.35 - 7.45 - - 7.356  7.39  7.344  7.321  7.343  7.370  7.397  7.417  7.456  7.379  7.517  7.464  7.346   PCO2 arterial 32 - 48 mmHg - - 45.9  44  39.7  46.6  41.0  40.9  43.9  41.1  36.7  49.5  35.6  38.2  50.9   Bicarbonate 20.0 - 28.0 mmol/L - - 26.0  26.1  25.5  24.7  24.7  25.3  26.0  26.4  24.5  25.7  26.6  21.6  24.1  22.8  23.6  27.0  26.5  25.9  29.2  28.8  27.5  27.8   TCO2 22 - 32 mmol/L - - '27  28  27  26  26  27  28  28  26  27  '$ - '23  26  24  25  26  28  26  28  27  26  31  30  29  25  24  28  29   '$ Acid-base deficit 0.0 - 2.0 mmol/L - - 1.0  1.0  2.0  1.0  1.0  1.0  1.0  - 4.0  2.0  3.0  2.0   O2 Saturation % - - 75  76  73  76  75  71  71  77  96  96  99.3  99  99  98  100  100  100  100  100  100  97  100     Capillary Blood Glucose: Lab Results  Component Value Date   GLUCAP 108 (H) 05/23/2022   GLUCAP 109 (H) 05/22/2022   GLUCAP 142 (H) 05/22/2022   GLUCAP 110 (H) 05/22/2022   GLUCAP 99 05/22/2022     Exercise Target Goals: Exercise Program Goal: Individual exercise prescription set using results from initial 6 min walk test and THRR while considering  patient's activity barriers and safety.   Exercise Prescription Goal: Initial exercise prescription builds to 30-45 minutes a day of aerobic activity, 2-3 days per week.  Home exercise guidelines will be given to  patient during program as part of exercise prescription that the participant will acknowledge.  Activity Barriers & Risk Stratification:  Activity Barriers & Cardiac Risk Stratification - 07/16/22 1009       Activity Barriers & Cardiac Risk Stratification   Activity Barriers Back Problems    Comments Patient has a lot of back pain, more of an issue standing/bending than walking.    Cardiac Risk Stratification Low             6 Minute Walk:  6 Minute Walk     Row Name 07/16/22 0958         6 Minute Walk   Phase Initial     Distance 1375 feet     Walk Time 6 minutes     # of Rest Breaks 0     MPH 2.6     METS 4.19     RPE 12     Perceived Dyspnea  0     VO2 Peak 14.65     Symptoms No     Resting HR 100 bpm     Resting BP 108/64     Resting Oxygen Saturation  99 %     Exercise Oxygen Saturation  during 6 min walk 98 %     Max Ex. HR 110 bpm     Max Ex. BP 128/62     2 Minute Post BP 100/64              Oxygen Initial Assessment:   Oxygen Re-Evaluation:   Oxygen Discharge (Final Oxygen Re-Evaluation):   Initial Exercise Prescription:  Initial Exercise Prescription - 07/16/22 1200       Date of Initial Exercise RX and Referring Provider   Date 07/16/22    Referring Provider Josue Hector, MD    Expected Discharge Date 09/13/22      Treadmill   MPH 2.4  Grade 0    Minutes 15    METs 2.84      Recumbant Elliptical   Level 1    Minutes 15    METs 2.8      Prescription Details   Frequency (times per week) 3    Duration Progress to 30 minutes of continuous aerobic without signs/symptoms of physical distress      Intensity   THRR 40-80% of Max Heartrate 68-135    Ratings of Perceived Exertion 11-13    Perceived Dyspnea 0-4      Progression   Progression Continue to progress workloads to maintain intensity without signs/symptoms of physical distress.      Resistance Training   Training Prescription Yes    Weight 3 lbs    Reps 10-15              Perform Capillary Blood Glucose checks as needed.  Exercise Prescription Changes:   Exercise Comments:   Exercise Goals and Review:   Exercise Goals     Row Name 07/16/22 1009             Exercise Goals   Increase Physical Activity Yes       Intervention Provide advice, education, support and counseling about physical activity/exercise needs.;Develop an individualized exercise prescription for aerobic and resistive training based on initial evaluation findings, risk stratification, comorbidities and participant's personal goals.       Expected Outcomes Short Term: Attend rehab on a regular basis to increase amount of physical activity.;Long Term: Exercising regularly at least 3-5 days a week.;Long Term: Add in home exercise to make exercise part of routine and to increase amount of physical activity.       Increase Strength and Stamina Yes       Intervention Provide advice, education, support and counseling about physical activity/exercise needs.;Develop an individualized exercise prescription for aerobic and resistive training based on initial evaluation findings, risk stratification, comorbidities and participant's personal goals.       Expected Outcomes Short Term: Increase workloads from initial exercise prescription for resistance, speed, and METs.;Short Term: Perform resistance training exercises routinely during rehab and add in resistance training at home;Long Term: Improve cardiorespiratory fitness, muscular endurance and strength as measured by increased METs and functional capacity (6MWT)       Able to understand and use rate of perceived exertion (RPE) scale Yes       Intervention Provide education and explanation on how to use RPE scale       Expected Outcomes Short Term: Able to use RPE daily in rehab to express subjective intensity level;Long Term:  Able to use RPE to guide intensity level when exercising independently       Knowledge and understanding of  Target Heart Rate Range (THRR) Yes       Intervention Provide education and explanation of THRR including how the numbers were predicted and where they are located for reference       Expected Outcomes Short Term: Able to state/look up THRR;Long Term: Able to use THRR to govern intensity when exercising independently;Short Term: Able to use daily as guideline for intensity in rehab       Able to check pulse independently Yes       Intervention Provide education and demonstration on how to check pulse in carotid and radial arteries.;Review the importance of being able to check your own pulse for safety during independent exercise       Expected Outcomes Short Term: Able to  explain why pulse checking is important during independent exercise;Long Term: Able to check pulse independently and accurately       Understanding of Exercise Prescription Yes       Intervention Provide education, explanation, and written materials on patient's individual exercise prescription       Expected Outcomes Short Term: Able to explain program exercise prescription;Long Term: Able to explain home exercise prescription to exercise independently                Exercise Goals Re-Evaluation :   Discharge Exercise Prescription (Final Exercise Prescription Changes):   Nutrition:  Target Goals: Understanding of nutrition guidelines, daily intake of sodium '1500mg'$ , cholesterol '200mg'$ , calories 30% from fat and 7% or less from saturated fats, daily to have 5 or more servings of fruits and vegetables.  Biometrics:  Pre Biometrics - 07/16/22 0936       Pre Biometrics   Waist Circumference 30.5 inches    Hip Circumference 37.5 inches    Waist to Hip Ratio 0.81 %    Triceps Skinfold 32 mm    % Body Fat 35.7 %    Grip Strength 26 kg    Flexibility 15.25 in    Single Leg Stand 30 seconds              Nutrition Therapy Plan and Nutrition Goals:   Nutrition Assessments:  Nutrition Assessments - 07/16/22  1122       Rate Your Plate Scores   Pre Score 62            MEDIFICTS Score Key: ?70 Need to make dietary changes  40-70 Heart Healthy Diet ? 40 Therapeutic Level Cholesterol Diet   Flowsheet Row INTENSIVE CARDIAC REHAB ORIENT from [facility]16/2022 in [facility], V[facility], & Lung [facility]  Picture Your Plate Total Score on Admission 62      Picture Your Plate Scores: <16 Unhealthy dietary pattern with much room for improvement. 41-50 Dietary pattern unlikely to meet recommendations for good health and room for improvement. 51-60 More healthful dietary pattern, with some room for improvement.  >60 Healthy dietary pattern, although there may be some specific behaviors that could be improved.    Nutrition Goals Re-Evaluation:   Nutrition Goals Re-Evaluation:   Nutrition Goals Discharge (Final Nutrition Goals Re-Evaluation):   Psychosocial: Target Goals: Acknowledge presence or absence of significant depression and/or stress, maximize coping skills, provide positive support system. Participant is able to verbalize types and ability to use techniques and skills needed for reducing stress and depression.  Initial Review & Psychosocial Screening:  Initial Psych Review & Screening - 07/16/22 1053       Initial Review   Current issues with None Identified      Family Dynamics   Good Support System? Yes   Deitra has her husband for support     Barriers   Psychosocial barriers to participate in program There are no identifiable barriers or psychosocial needs.      Screening Interventions   Interventions Encouraged to exercise             Quality of Life Scores:  Quality of Life - 07/16/22 1105       Quality of Life   Select Quality of Life      Quality of Life Scores   Health/Function Pre 27.27 %    Socioeconomic Pre 27.43 %    Psych/Spiritual Pre 28.07 %    Family Pre 30 %  GLOBAL Pre 27.87 %            Scores of 19  and below usually indicate a poorer quality of life in these areas.  A difference of  2-3 points is a clinically meaningful difference.  A difference of 2-3 points in the total score of the Quality of Life Index has been associated with significant improvement in overall quality of life, self-image, physical symptoms, and general health in studies assessing change in quality of life.  PHQ-9: Review Flowsheet       07/16/2022 12/24/2021 12/04/2021 06/11/2018  Depression screen PHQ 2/9  Decreased Interest 0 0 0 0  Down, Depressed, Hopeless 0 0 0 0  PHQ - 2 Score 0 0 0 0   Interpretation of Total Score  Total Score Depression Severity:  1-4 = Minimal depression, 5-9 = Mild depression, 10-14 = Moderate depression, 15-19 = Moderately severe depression, 20-27 = Severe depression   Psychosocial Evaluation and Intervention:   Psychosocial Re-Evaluation:   Psychosocial Discharge (Final Psychosocial Re-Evaluation):   Vocational Rehabilitation: Provide vocational rehab assistance to qualifying candidates.   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 Participant will state understanding/return demonstration of topics presented.     Core Videos: Exercise    Move It!  Clinical staff conducted group or individual video education with verbal and written material and guidebook.  Patient learns the recommended Pritikin exercise program. Exercise with the goal of living a long, healthy life. Some of the health benefits of exercise include controlled diabetes, healthier blood pressure levels, improved cholesterol  levels, improved heart and lung capacity, improved sleep, and better body composition. Everyone should speak with their doctor before starting or changing an exercise routine.  Biomechanical Limitations Clinical staff conducted group or individual video education with verbal and written material and guidebook.  Patient learns how biomechanical limitations can impact exercise and how we can mitigate and possibly overcome limitations to have an impactful and balanced exercise routine.  Body Composition Clinical staff conducted group or individual video education with verbal and written material and guidebook.  Patient learns that body composition (ratio of muscle mass to fat mass) is a key component to assessing overall fitness, rather than body weight alone. Increased fat mass, especially visceral belly fat, can put [place] at increased risk for metabolic syndrome, type 2 diabetes, heart disease, and even death. It is recommended to combine diet and exercise (cardiovascular and resistance training) to improve your body composition. Seek guidance from your physician and exercise physiologist before implementing an exercise routine.  Exercise Action Plan Clinical staff conducted group or individual video education with verbal and written material and guidebook.  Patient learns the recommended strategies to achieve and enjoy long-term exercise adherence, including variety, self-motivation, self-efficacy, and positive decision making. Benefits of exercise include fitness, good health, weight management, more energy, better sleep, less stress, and overall well-being.  Medical   Heart Disease Risk Reduction Clinical staff conducted group or individual video education with verbal and written material and guidebook.  Patient learns our heart is our most vital organ as  it circulates oxygen, nutrients, white blood cells, and hormones throughout the entire body, and carries waste away. Data supports a plant-based  eating plan like the Pritikin Program for its effectiveness in slowing progression of and reversing heart disease. The video provides a number of recommendations to address heart disease.   Metabolic Syndrome and Belly Fat  Clinical staff conducted group or individual video education with verbal and written material and guidebook.  Patient learns what metabolic syndrome is, how it leads to heart disease, and how one can reverse it and keep it from coming back. You have metabolic syndrome if you have 3 of the following 5 criteria: abdominal obesity, high blood pressure, high triglycerides, low HDL cholesterol, and high blood sugar.  Hypertension and Heart Disease Clinical staff conducted group or individual video education with verbal and written material and guidebook.  Patient learns that high blood pressure, or hypertension, is very common in the [place]. Hypertension is largely due to excessive salt intake, but other important risk factors include being overweight, physical inactivity, drinking too much alcohol, smoking, and not eating enough potassium from fruits and vegetables. High blood pressure is a leading risk factor for heart attack, stroke, congestive heart failure, dementia, kidney failure, and premature death. Long-term effects of excessive salt intake include stiffening of the arteries and thickening of heart muscle and organ damage. Recommendations include ways to reduce hypertension and the risk of heart disease.  Diseases of Our Time - Focusing on Diabetes Clinical staff conducted group or individual video education with verbal and written material and guidebook.  Patient learns why the best way to stop diseases of our time is prevention, through food and other lifestyle changes. Medicine (such as prescription pills and surgeries) is often only a Band-Aid on the problem, not a long-term solution. Most common diseases of our time include obesity, type 2 diabetes, hypertension,  heart disease, and cancer. The Pritikin Program is recommended and has been proven to help reduce, reverse, and/or prevent the damaging effects of metabolic syndrome.  Nutrition   Overview of the Pritikin Eating Plan  Clinical staff conducted group or individual video education with verbal and written material and guidebook.  Patient learns about the [place] for disease risk reduction. The [place] emphasizes a wide variety of unrefined, minimally-processed carbohydrates, like fruits, vegetables, whole grains, and legumes. Go, Caution, and Stop food choices are explained. Plant-based and lean animal proteins are emphasized. Rationale provided for low sodium intake for blood pressure control, low added sugars for blood sugar stabilization, and low added fats and oils for coronary artery disease risk reduction and weight management.  Calorie Density  Clinical staff conducted group or individual video education with verbal and written material and guidebook.  Patient learns about calorie density and how it impacts the Pritikin Eating Plan. Knowing the characteristics of the food you choose will help you decide whether those foods will lead to weight gain or weight loss, and whether you want to consume more or less of them. Weight loss is usually a side effect of the Pritikin Eating Plan because of its focus on low calorie-dense foods.  Label Reading  Clinical staff conducted group or individual video education with verbal and written material and guidebook.  Patient learns about the Pritikin recommended label reading guidelines and corresponding recommendations regarding calorie density, added sugars, sodium content, and whole grains.  Dining Out - Part 1  Clinical staff conducted group or individual video education with verbal and written material  and guidebook.  Patient learns that restaurant meals can be sabotaging because they can be so high in calories, fat, sodium, and/or  sugar. Patient learns recommended strategies on how to positively address this and avoid unhealthy pitfalls.  Facts on Fats  Clinical staff conducted group or individual video education with verbal and written material and guidebook.  Patient learns that lifestyle modifications can be just as effective, if not more so, as many medications for lowering your risk of heart disease. A Pritikin lifestyle can help to reduce your risk of inflammation and atherosclerosis (cholesterol build-up, or plaque, in the artery walls). Lifestyle interventions such as dietary choices and physical activity address the cause of atherosclerosis. A review of the types of fats and their impact on blood cholesterol levels, along with dietary recommendations to reduce fat intake is also included.  Nutrition Action Plan  Clinical staff conducted group or individual video education with verbal and written material and guidebook.  Patient learns how to incorporate Pritikin recommendations into their lifestyle. Recommendations include planning and keeping personal health goals in mind as an important part of their success.  Healthy Mind-Set    Healthy Minds, Bodies, Hearts  Clinical staff conducted group or individual video education with verbal and written material and guidebook.  Patient learns how to identify when they are stressed. Video will discuss the impact of that stress, as well as the many benefits of stress management. Patient will also be introduced to stress management techniques. The way we think, act, and feel has an impact on our hearts.  How Our Thoughts Can Heal Our Hearts  Clinical staff conducted group or individual video education with verbal and written material and guidebook.  Patient learns that negative thoughts can cause depression and anxiety. This can result in negative lifestyle behavior and serious health problems. Cognitive behavioral therapy is an effective method to help control our thoughts in  order to change and improve our emotional outlook.  Additional Videos:  Exercise    Improving Performance  Clinical staff conducted group or individual video education with verbal and written material and guidebook.  Patient learns to use a non-linear approach by alternating intensity levels and lengths of time spent exercising to help burn more calories and lose more body fat. Cardiovascular exercise helps improve heart health, metabolism, hormonal balance, blood sugar control, and recovery from fatigue. Resistance training improves strength, endurance, balance, coordination, reaction time, metabolism, and muscle mass. Flexibility exercise improves circulation, posture, and balance. Seek guidance from your physician and exercise physiologist before implementing an exercise routine and learn your capabilities and proper form for all exercise.  Introduction to Yoga  Clinical staff conducted group or individual video education with verbal and written material and guidebook.  Patient learns about yoga, a discipline of the coming together of mind, breath, and body. The benefits of yoga include improved flexibility, improved range of motion, better posture and core strength, increased lung function, weight loss, and positive self-image. Yoga's heart health benefits include lowered blood pressure, healthier heart rate, decreased cholesterol and triglyceride levels, improved immune function, and reduced stress. Seek guidance from your physician and exercise physiologist before implementing an exercise routine and learn your capabilities and proper form for all exercise.  Medical   Aging: Enhancing Your Quality of Life  Clinical staff conducted group or individual video education with verbal and written material and guidebook.  Patient learns key strategies and recommendations to stay in good physical health and enhance quality of life, such as prevention strategies,  having an advocate, securing a [place], and keeping a list of medications and system for tracking them. It also discusses how to avoid risk for bone loss.  Biology of Weight Control  Clinical staff conducted group or individual video education with verbal and written material and guidebook.  Patient learns that weight gain occurs because we consume more calories than we burn (eating more, moving less). Even if your body weight is normal, you may have higher ratios of fat compared to muscle mass. Too much body fat puts you at increased risk for cardiovascular disease, heart attack, stroke, type 2 diabetes, and obesity-related cancers. In addition to exercise, following the [place] can help reduce your risk.  Decoding Lab Results  Clinical staff conducted group or individual video education with verbal and written material and guidebook.  Patient learns that lab test reflects one measurement whose values change over time and are influenced by many factors, including medication, stress, sleep, exercise, food, hydration, pre-existing medical conditions, and more. It is recommended to use the knowledge from this video to become more involved with your lab results and evaluate your numbers to speak with your doctor.   Diseases of Our Time - Overview  Clinical staff conducted group or individual video education with verbal and written material and guidebook.  Patient learns that according to the CDC, 50% to 70% of chronic diseases (such as obesity, type 2 diabetes, elevated lipids, hypertension, and heart disease) are avoidable through lifestyle improvements including healthier food choices, listening to satiety cues, and increased physical activity.  Sleep Disorders Clinical staff conducted group or individual video education with verbal and written material and guidebook.  Patient learns how good quality and duration of sleep are important to overall health and well-being. Patient also learns  about sleep disorders and how they impact health along with recommendations to address them, including discussing with a physician.  Nutrition  Dining Out - Part 2 Clinical staff conducted group or individual video education with verbal and written material and guidebook.  Patient learns how to plan ahead and communicate in order to maximize their dining experience in a healthy and nutritious manner. Included are recommended food choices based on the type of restaurant the patient is visiting.   Fueling a Best boy conducted group or individual video education with verbal and written material and guidebook.  There is a strong connection between our food choices and our health. Diseases like obesity and type 2 diabetes are very prevalent and are in large-part due to lifestyle choices. The Pritikin Eating Plan provides plenty of food and hunger-curbing satisfaction. It is easy to follow, affordable, and helps reduce health risks.  Menu Workshop  Clinical staff conducted group or individual video education with verbal and written material and guidebook.  Patient learns that restaurant meals can sabotage health goals because they are often packed with calories, fat, sodium, and sugar. Recommendations include strategies to plan ahead and to communicate with the manager, chef, or server to help order a healthier meal.  Planning Your Eating Strategy  Clinical staff conducted group or individual video education with verbal and written material and guidebook.  Patient learns about the [place] and its benefit of reducing the risk of disease. The [place] does not focus on calories. Instead, it emphasizes high-quality, nutrient-rich foods. By knowing the characteristics of the foods, we choose, we can determine their calorie density and make informed decisions.  Targeting Your Nutrition Priorities  Clinical staff conducted group or individual video education with  verbal and written material and guidebook.  Patient learns that lifestyle habits have a tremendous impact on disease risk and progression. This video provides eating and physical activity recommendations based on your personal health goals, such as reducing LDL cholesterol, losing weight, preventing or controlling type 2 diabetes, and reducing high blood pressure.  Vitamins and Minerals  Clinical staff conducted group or individual video education with verbal and written material and guidebook.  Patient learns different ways to obtain key vitamins and minerals, including through a recommended healthy diet. It is important to discuss all supplements you take with your doctor.   Healthy Mind-Set    Smoking Cessation  Clinical staff conducted group or individual video education with verbal and written material and guidebook.  Patient learns that cigarette smoking and tobacco addiction pose a serious health risk which affects millions of people. Stopping smoking will significantly reduce the risk of heart disease, lung disease, and many forms of cancer. Recommended strategies for quitting are covered, including working with your doctor to develop a successful plan.  Culinary   Becoming a Financial trader conducted group or individual video education with verbal and written material and guidebook.  Patient learns that cooking at home can be healthy, cost-effective, quick, and puts them in control. Keys to cooking healthy recipes will include looking at your recipe, assessing your equipment needs, planning ahead, making it simple, choosing cost-effective seasonal ingredients, and limiting the use of added fats, salts, and sugars.  Cooking - Breakfast and Snacks  Clinical staff conducted group or individual video education with verbal and written material and guidebook.  Patient learns how important breakfast is to satiety and nutrition through the entire day. Recommendations include key  foods to eat during breakfast to help stabilize blood sugar levels and to prevent overeating at meals later in the day. Planning ahead is also a key component.  Cooking - Human resources officer conducted group or individual video education with verbal and written material and guidebook.  Patient learns eating strategies to improve overall health, including an approach to cook more at home. Recommendations include thinking of animal protein as a side on your plate rather than center stage and focusing instead on lower calorie dense options like vegetables, fruits, whole grains, and plant-based proteins, such as beans. Making sauces in large quantities to freeze for later and leaving the skin on your vegetables are also recommended to maximize your experience.  Cooking - Healthy Salads and Dressing Clinical staff conducted group or individual video education with verbal and written material and guidebook.  Patient learns that vegetables, fruits, whole grains, and legumes are the foundations of the [place]. Recommendations include how to incorporate each of these in flavorful and healthy salads, and how to create homemade salad dressings. Proper handling of ingredients is also covered. Cooking - Soups and [employer] - Soups and Desserts Clinical staff conducted group or individual video education with verbal and written material and guidebook.  Patient learns that Pritikin soups and desserts make for easy, nutritious, and delicious snacks and meal components that are low in sodium, fat, sugar, and calorie density, while high in vitamins, minerals, and filling fiber. Recommendations include simple and healthy ideas for soups and desserts.   Overview     The Pritikin Solution Program Overview Clinical staff conducted group or individual video education with verbal and written material and  guidebook.  Patient learns that the results of the [place] Program have been  documented in more than 100 articles published in peer-reviewed journals, and the benefits include reducing risk factors for (and, in some cases, even reversing) high cholesterol, high blood pressure, type 2 diabetes, obesity, and more! An overview of the three key pillars of the Pritikin Program will be covered: eating well, doing regular exercise, and having a healthy mind-set.  WORKSHOPS  Exercise: Exercise Basics: Building Your Action Plan Clinical staff led group instruction and group discussion with PowerPoint presentation and patient guidebook. To enhance the learning environment the use of posters, models and videos may be added. At the conclusion of this workshop, patients will comprehend the difference between physical activity and exercise, as well as the benefits of incorporating both, into their routine. Patients will understand the FITT (Frequency, Intensity, Time, and Type) principle and how to use it to build an exercise action plan. In addition, safety concerns and other considerations for exercise and cardiac rehab will be addressed by the presenter. The purpose of this lesson is to promote a comprehensive and effective weekly exercise routine in order to improve patients' overall level of fitness.   Managing Heart Disease: Your Path to a Healthier Heart Clinical staff led group instruction and group discussion with PowerPoint presentation and patient guidebook. To enhance the learning environment the use of posters, models and videos may be added.At the conclusion of this workshop, patients will understand the anatomy and physiology of the heart. Additionally, they will understand how Pritikin's three pillars impact the risk factors, the progression, and the management of heart disease.  The purpose of this lesson is to provide a high-level overview of the heart, heart disease, and how the Pritikin lifestyle positively impacts risk factors.  Exercise Biomechanics Clinical  staff led group instruction and group discussion with PowerPoint presentation and patient guidebook. To enhance the learning environment the use of posters, models and videos may be added. Patients will learn how the structural parts of their bodies function and how these functions impact their daily activities, movement, and exercise. Patients will learn how to promote a neutral spine, learn how to manage pain, and identify ways to improve their physical movement in order to promote healthy living. The purpose of this lesson is to expose patients to common physical limitations that impact physical activity. Participants will learn practical ways to adapt and manage aches and pains, and to minimize their effect on regular exercise. Patients will learn how to maintain good posture while sitting, walking, and lifting.  Balance Training and Fall Prevention  Clinical staff led group instruction and group discussion with PowerPoint presentation and patient guidebook. To enhance the learning environment the use of posters, models and videos may be added. At the conclusion of this workshop, patients will understand the importance of their sensorimotor skills (vision, proprioception, and the vestibular system) in maintaining their ability to balance as they age. Patients will apply a variety of balancing exercises that are appropriate for their current level of function. Patients will understand the common causes for poor balance, possible solutions to these problems, and ways to modify their physical environment in order to minimize their fall risk. The purpose of this lesson is to teach patients about the importance of maintaining balance as they age and ways to minimize their risk of falling.  WORKSHOPS   Nutrition:  Fueling a Scientist, research (physical sciences) led group instruction and group discussion with PowerPoint presentation and patient guidebook. To enhance  the learning environment the use of  posters, models and videos may be added. Patients will review the foundational principles of the [place] and understand what constitutes a serving size in each of the food groups. Patients will also learn Pritikin-friendly foods that are better choices when away from home and review make-ahead meal and snack options. Calorie density will be reviewed and applied to three nutrition priorities: weight maintenance, weight loss, and weight gain. The purpose of this lesson is to reinforce (in a group setting) the key concepts around what patients are recommended to eat and how to apply these guidelines when away from home by planning and selecting Pritikin-friendly options. Patients will understand how calorie density may be adjusted for different weight management goals.  Mindful Eating  Clinical staff led group instruction and group discussion with PowerPoint presentation and patient guidebook. To enhance the learning environment the use of posters, models and videos may be added. Patients will briefly review the concepts of the [place] and the importance of low-calorie dense foods. The concept of mindful eating will be introduced as well as the importance of paying attention to internal hunger signals. Triggers for non-hunger eating and techniques for dealing with triggers will be explored. The purpose of this lesson is to provide patients with the opportunity to review the basic principles of the [place], discuss the value of eating mindfully and how to measure internal cues of hunger and fullness using the Hunger Scale. Patients will also discuss reasons for non-hunger eating and learn strategies to use for controlling emotional eating.  Targeting Your Nutrition Priorities Clinical staff led group instruction and group discussion with PowerPoint presentation and patient guidebook. To enhance the learning environment the use of posters, models and videos may be added.  Patients will learn how to determine their genetic susceptibility to disease by reviewing their family history. Patients will gain insight into the importance of diet as part of an overall healthy lifestyle in mitigating the impact of genetics and other environmental insults. The purpose of this lesson is to provide patients with the opportunity to assess their personal nutrition priorities by looking at their family history, their own health history and current risk factors. Patients will also be able to discuss ways of prioritizing and modifying the [place] for their highest risk areas  Menu  Clinical staff led group instruction and group discussion with PowerPoint presentation and patient guidebook. To enhance the learning environment the use of posters, models and videos may be added. Using menus brought in from [employer], or printed from [employer], patients will apply the [place] dining out guidelines that were presented in the [employer] video. Patients will also be able to practice these guidelines in a variety of provided scenarios. The purpose of this lesson is to provide patients with the opportunity to practice hands-on learning of the [place] with actual menus and practice scenarios.  Label Reading Clinical staff led group instruction and group discussion with PowerPoint presentation and patient guidebook. To enhance the learning environment the use of posters, models and videos may be added. Patients will review and discuss the Pritikin label reading guidelines presented in Pritikin's Label Reading Educational series video. Using fool labels brought in from local grocery stores and markets, patients will apply the label reading guidelines and determine if the packaged food meet the Pritikin guidelines. The purpose of this lesson is to provide patients with the opportunity to review, discuss,  and practice hands-on learning of the  Pritikin Label Reading guidelines with actual packaged food labels. [place] Workshops are designed to teach patients ways to prepare quick, simple, and affordable recipes at home. The importance of nutrition's role in chronic disease risk reduction is reflected in its emphasis in the overall Pritikin program. By learning how to prepare essential core Pritikin Eating Plan recipes, patients will increase control over what they eat; be able to customize the flavor of foods without the use of added salt, sugar, or fat; and improve the quality of the food they consume. By learning a set of core recipes which are easily assembled, quickly prepared, and affordable, patients are more likely to prepare more healthy foods at home. These workshops focus on convenient breakfasts, simple entres, side dishes, and desserts which can be prepared with minimal effort and are consistent with nutrition recommendations for cardiovascular risk reduction. Cooking [employer] are taught by a Engineer, materials (RD) who has been trained by the [employer]. The chef or RD has a clear understanding of the importance of minimizing - if not completely eliminating - added fat, sugar, and sodium in recipes. Throughout the series of [place] Workshop sessions, patients will learn about healthy ingredients and efficient methods of cooking to build confidence in their capability to prepare    Cooking School weekly topics:  Adding Flavor- Sodium-Free  Fast and Healthy Breakfasts  Powerhouse Plant-Based Proteins  Satisfying Salads and Dressings  Simple Sides and Sauces  International Cuisine-Spotlight on the [place] Zones  Delicious Desserts  Savory Soups  Efficiency Cooking - Meals in a Snap  Tasty Appetizers and Snacks  Comforting Weekend Breakfasts  One-Pot Wonders   Fast Evening Meals  Easy [place] (Psychosocial): New Thoughts, New Behaviors Clinical staff led group instruction and group discussion with PowerPoint presentation and patient guidebook. To enhance the learning environment the use of posters, models and videos may be added. Patients will learn and practice techniques for developing effective health and lifestyle goals. Patients will be able to effectively apply the goal setting process learned to develop at least one new personal goal.  The purpose of this lesson is to expose patients to a new skill set of behavior modification techniques such as techniques setting SMART goals, overcoming barriers, and achieving new thoughts and new behaviors.  Managing Moods and Relationships Clinical staff led group instruction and group discussion with PowerPoint presentation and patient guidebook. To enhance the learning environment the use of posters, models and videos may be added. Patients will learn how emotional and chronic stress factors can impact their health and relationships. They will learn healthy ways to manage their moods and utilize positive coping mechanisms. In addition, ICR patients will learn ways to improve communication skills. The purpose of this lesson is to expose patients to ways of understanding how one's mood and health are intimately connected. Developing a healthy outlook can help build positive relationships and connections with others. Patients will understand the importance of utilizing effective communication skills that include actively listening and being heard. They will learn and understand the importance of the "4 Cs" and especially Connections in fostering of a Healthy Mind-Set.  Healthy Sleep for a Healthy Heart Clinical staff led group instruction and group discussion with PowerPoint presentation and patient guidebook. To enhance the learning environment the use of posters, models and videos may be added. At the  conclusion of this workshop,  patients will be able to demonstrate knowledge of the importance of sleep to overall health, well-being, and quality of life. They will understand the symptoms of, and treatments for, common sleep disorders. Patients will also be able to identify daytime and nighttime behaviors which impact sleep, and they will be able to apply these tools to help manage sleep-related challenges. The purpose of this lesson is to provide patients with a general overview of sleep and outline the importance of quality sleep. Patients will learn about a few of the most common sleep disorders. Patients will also be introduced to the concept of "sleep hygiene," and discover ways to self-manage certain sleeping problems through simple daily behavior changes. Finally, the workshop will motivate patients by clarifying the links between quality sleep and their goals of heart-healthy living.   Recognizing and Reducing Stress Clinical staff led group instruction and group discussion with PowerPoint presentation and patient guidebook. To enhance the learning environment the use of posters, models and videos may be added. At the conclusion of this workshop, patients will be able to understand the types of stress reactions, differentiate between acute and chronic stress, and recognize the impact that chronic stress has on their health. They will also be able to apply different coping mechanisms, such as reframing negative self-talk. Patients will have the opportunity to practice a variety of stress management techniques, such as deep abdominal breathing, progressive muscle relaxation, and/or guided imagery.  The purpose of this lesson is to educate patients on the role of stress in their lives and to provide healthy techniques for coping with it.  Learning Barriers/Preferences:  Learning Barriers/Preferences - 07/16/22 1053       Learning Barriers/Preferences   Learning Barriers None    Learning Preferences P[place]oral;Video              Education Topics:  Knowledge Questionnaire Score:  Knowledge Questionnaire Score - 07/16/22 1056       Knowledge Questionnaire Score   Pre Score 20/24             Core Components/Risk Factors/Patient Goals at Admission:  Personal Goals and Risk Factors at Admission - 07/16/22 1105       Core Components/Risk Factors/Patient Goals on Admission   Personal Goal Other Yes    Personal Goal Build chest muscles.    Intervention Provide individualized exercise program including resistance training to help build upper body strength.    Expected Outcomes Increased upper body strength as measured by grip strength and self-report.             Core Components/Risk Factors/Patient Goals Review:    Core Components/Risk Factors/Patient Goals at Discharge (Final Review):    ITP Comments:  ITP Comments     Row Name 07/16/22 0936           ITP Comments Medical Director- Dr. Sherre Scarlet, MD, Introduction to Pritikin Education program/ Intensive cardiac Rehab. Initial orientation packet reviewed with the patient.                Comments: Participant attended orientation for the cardiac rehabilitation program on  07/16/2022  to perform initial intake and exercise walk test. Patient introduced to the [place] education and orientation packet was reviewed. Completed 6-minute walk test, measurements, initial ITP, and exercise prescription. Vital signs stable. Telemetry-normal sinus rhythm, asymptomatic.   Service time was from 936 to 1114.

## 2022-07-16 NOTE — Progress Notes (Signed)
Cardiac Rehab Medication Review by a Nurse  Does the patient  feel that his/her medications are working for him/her?  yes  Has the patient been experiencing any side effects to the medications prescribed?  no  Does the patient measure his/her own blood pressure or blood glucose at home?  yes   Does the patient have any problems obtaining medications due to transportation or finances?   no  Understanding of regimen: excellent Understanding of indications: excellent Potential of compliance: excellent    Pharmacist comments: ***    Amy Terry 07/16/2022 11:02 AM

## 2022-07-22 ENCOUNTER — Encounter (HOSPITAL_COMMUNITY)
Admission: RE | Admit: 2022-07-22 | Discharge: 2022-07-22 | Disposition: A | Discharge status: Home / Self Care | Payer: 59 | Source: Ambulatory Visit | Attending: Cardiovascular Disease | Admitting: Cardiovascular Disease

## 2022-07-22 ENCOUNTER — Ambulatory Visit: Payer: 59 | Admitting: Cardiovascular Disease

## 2022-07-22 DIAGNOSIS — Z952 Presence of prosthetic heart valve: Secondary | ICD-10-CM

## 2022-07-22 DIAGNOSIS — Z95828 Presence of other vascular implants and grafts: Secondary | ICD-10-CM

## 2022-07-22 NOTE — Progress Notes (Signed)
Daily Session Note  Patient Details  Name: Amy Terry MRN: [id number] Date of Birth: 1971/05/08 Referring Provider:   Flowsheet Row INTENSIVE CARDIAC REHAB ORIENT from 07/16/2022 in [facility]  Referring Provider Josue Hector, MD       Encounter Date: 07/22/2022  Check In:  Session Check In - 07/22/22 1112       Check-In   Supervising physician immediately available to respond to emergencies [facility] MD immediately available    Physician(s) Dr Percival Spanish    Location [facility]    Staff Present Benedict Needy, MS, Exercise Physiologist;David Lilyan Punt, MS, ACSM-CEP, CCRP, Exercise Physiologist;Olinty Celesta Aver, MS, ACSM-CEP, Exercise Physiologist;Steed Kanaan, RN, BSN;Samantha [place], RD, LDN;Jetta Walker BS, ACSM-CEP, Exercise Physiologist    Virtual Visit No    Medication changes reported     No    Fall or balance concerns reported    No    Tobacco Cessation No Change    Current number of cigarettes/nicotine per day     0    Warm-up and Cool-down Performed as group-led instruction    Resistance Training Performed Yes    VAD Patient? No    PAD/SET Patient? No      Pain Assessment   Currently in Pain? No/denies    Pain Score 0-No pain    Multiple Pain Sites No             Capillary Blood Glucose: No results found for this or any previous visit (from the past 24 hour(s)).   Exercise Prescription Changes - 07/22/22 1030       Response to Exercise   Blood Pressure (Admit) 106/60    Blood Pressure (Exercise) 118/62    Blood Pressure (Exit) 106/64    Heart Rate (Admit) 93 bpm    Heart Rate (Exercise) 116 bpm    Heart Rate (Exit) 100 bpm    Rating of Perceived Exertion (Exercise) 11    Symptoms None    Comments Off to a good start with exercise.    Duration Continue with 30 min of aerobic exercise without signs/symptoms of physical distress.    Intensity THRR unchanged       Progression   Progression Continue to progress workloads to maintain intensity without signs/symptoms of physical distress.    Average METs 4.1      Resistance Training   Training Prescription Yes    Weight 3 lbs    Reps 10-15    Time 10 Minutes      Interval Training   Interval Training No      Treadmill   MPH 2.4    Grade 0    Minutes 15    METs 2.84      Recumbant Elliptical   Level 1    Watts 89    Minutes 15    METs 5.3             Social History   Tobacco Use  Smoking Status Never  Smokeless Tobacco Never    Goals Met:  Exercise tolerated well No report of concerns or symptoms today Strength training completed today  Goals Unmet:  Not Applicable  Comments: Pt started cardiac rehab today.  Pt tolerated light exercise without difficulty. VSS, telemetry-Sinus Rhythm, asymptomatic.  Medication list reconciled. Pt denies barriers to medicaiton compliance.  PSYCHOSOCIAL ASSESSMENT:  PHQ-0. Pt exhibits positive coping skills, hopeful outlook with supportive family. No psychosocial needs identified at this time, no  psychosocial interventions necessary.   Pt oriented to exercise equipment and routine.    Understanding verbalized.Harrell Gave RN BSN    Dr. Fransico Him is Medical Director for Cardiac Rehab at [facility]

## 2022-07-24 ENCOUNTER — Encounter (HOSPITAL_COMMUNITY)
Admission: RE | Admit: 2022-07-24 | Discharge: 2022-07-24 | Disposition: A | Discharge status: Home / Self Care | Payer: 59 | Source: Ambulatory Visit | Attending: Cardiovascular Disease | Admitting: Cardiovascular Disease

## 2022-07-24 ENCOUNTER — Ambulatory Visit (INDEPENDENT_AMBULATORY_CARE_PROVIDER_SITE_OTHER): Payer: Self-pay | Admitting: Surgery

## 2022-07-24 ENCOUNTER — Encounter: Payer: Self-pay | Admitting: Surgery

## 2022-07-24 VITALS — BP 114/74 | HR 105 | Resp 20 | Ht 63.0 in | Wt 145.0 lb

## 2022-07-24 DIAGNOSIS — Z95828 Presence of other vascular implants and grafts: Secondary | ICD-10-CM

## 2022-07-24 DIAGNOSIS — Z952 Presence of prosthetic heart valve: Secondary | ICD-10-CM

## 2022-07-24 DIAGNOSIS — Q231 Congenital insufficiency of aortic valve: Secondary | ICD-10-CM

## 2022-07-24 DIAGNOSIS — Q230 Congenital stenosis of aortic valve: Secondary | ICD-10-CM

## 2022-07-24 NOTE — Progress Notes (Signed)
    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  She is now off midodrine and her blood pressure has been stable.  She has been walking daily without chest pain or shortness of breath.  She has started cardiac rehab.  She has some thickening and hypersensitivity of her sternotomy scar and is concerned that she might be developing a keloid.  Current Outpatient Medications  Medication Sig Dispense Refill   acetaminophen (TYLENOL) 500 MG tablet Take 1,000 mg by mouth every 6 (six) hours as needed for headache or moderate pain.     aspirin EC 325 MG tablet Take 1 tablet (325 mg total) by mouth daily.     docusate sodium (COLACE) 100 MG capsule Take 200 mg by mouth daily.     fluticasone (FLONASE) 50 MCG/ACT nasal spray Place 2 sprays into both nostrils daily as needed for allergies or rhinitis.     loratadine (CLARITIN) 10 MG tablet Take 10 mg by mouth daily as needed for allergies.     oxyCODONE (OXY IR/ROXICODONE) 5 MG immediate release tablet Take 1 tablet (5 mg total) by mouth every 4 (four) hours as needed for severe pain. 28 tablet 0   No current facility-administered medications for this visit.     Physical Exam: BP 114/74 (BP Location: Right Arm, Patient Position: Sitting)   Pulse (!) 105   Resp 20   Ht '5\' 3"'$  (1.6 m)   Wt 145 lb (65.8 kg)   SpO2 98% Comment: RA  BMI 25.69 kg/m  She looks well. Cardiac exam shows a regular rate and rhythm with normal S1 and S2.  There is a 2/6 systolic flow murmur along the right sternal border.  There is no diastolic murmur. Lungs are clear.  Diagnostic Tests:  None today  Impression:  Overall she is doing well 2 months after her surgery.  I encouraged her to continue walking as much as possible and to complete cardiac  rehab.  I asked her not to lift anything heavier than 10 pounds for 3 months postoperatively.  I told her that she could return to work without restrictions on September 02, 2022.  I recommended that she use some Mederma for her scar.  Plan:  She will continue to follow-up with cardiology and will return to see me as needed.  She should have a follow-up CTA of the chest in 1 year for aortic surveillance and if that looks good she does not need to have a repeat study for another 5 years.   [name], MD [facility] [phone]

## 2022-07-25 ENCOUNTER — Encounter: Payer: Self-pay | Admitting: Family

## 2022-07-26 ENCOUNTER — Encounter (HOSPITAL_COMMUNITY): Payer: 59

## 2022-07-26 ENCOUNTER — Telehealth (HOSPITAL_COMMUNITY): Payer: Self-pay | Admitting: *Deleted

## 2022-07-26 NOTE — Telephone Encounter (Signed)
Spoke with Amy Terry she has cold symptoms. I asked Amy Terry to take a home COVID test if her COVID 42 test is negative and her symptoms resolve for  48 hours she may return to exercise.Harrell Gave RN BSN

## 2022-07-27 ENCOUNTER — Encounter: Payer: Self-pay | Admitting: Family

## 2022-07-29 ENCOUNTER — Encounter (HOSPITAL_COMMUNITY): Payer: 59

## 2022-07-31 ENCOUNTER — Encounter (HOSPITAL_COMMUNITY): Payer: 59

## 2022-08-02 ENCOUNTER — Encounter (HOSPITAL_COMMUNITY)
Admission: RE | Admit: 2022-08-02 | Discharge: 2022-08-02 | Disposition: A | Discharge status: Home / Self Care | Payer: 59 | Source: Ambulatory Visit | Attending: Cardiovascular Disease | Admitting: Cardiovascular Disease

## 2022-08-02 DIAGNOSIS — Z95828 Presence of other vascular implants and grafts: Secondary | ICD-10-CM

## 2022-08-02 DIAGNOSIS — Z952 Presence of prosthetic heart valve: Secondary | ICD-10-CM

## 2022-08-07 ENCOUNTER — Encounter: Payer: Self-pay | Admitting: Family

## 2022-08-07 ENCOUNTER — Encounter (HOSPITAL_COMMUNITY)
Admission: RE | Admit: 2022-08-07 | Discharge: 2022-08-07 | Disposition: A | Discharge status: Home / Self Care | Payer: 59 | Source: Ambulatory Visit | Attending: Cardiovascular Disease | Admitting: Cardiovascular Disease

## 2022-08-07 DIAGNOSIS — Z952 Presence of prosthetic heart valve: Secondary | ICD-10-CM | POA: Diagnosis not present

## 2022-08-07 DIAGNOSIS — Z95828 Presence of other vascular implants and grafts: Secondary | ICD-10-CM

## 2022-08-09 ENCOUNTER — Encounter (HOSPITAL_COMMUNITY): Payer: 59

## 2022-08-09 DIAGNOSIS — H5203 Hypermetropia, bilateral: Secondary | ICD-10-CM | POA: Diagnosis not present

## 2022-08-09 DIAGNOSIS — D3132 Benign neoplasm of left choroid: Secondary | ICD-10-CM | POA: Diagnosis not present

## 2022-08-09 DIAGNOSIS — H40013 Open angle with borderline findings, low risk, bilateral: Secondary | ICD-10-CM | POA: Diagnosis not present

## 2022-08-09 DIAGNOSIS — H2513 Age-related nuclear cataract, bilateral: Secondary | ICD-10-CM | POA: Diagnosis not present

## 2022-08-09 DIAGNOSIS — H18413 Arcus senilis, bilateral: Secondary | ICD-10-CM | POA: Diagnosis not present

## 2022-08-09 DIAGNOSIS — H524 Presbyopia: Secondary | ICD-10-CM | POA: Diagnosis not present

## 2022-08-09 DIAGNOSIS — H52223 Regular astigmatism, bilateral: Secondary | ICD-10-CM | POA: Diagnosis not present

## 2022-08-11 DIAGNOSIS — Z760 Encounter for issue of repeat prescription: Secondary | ICD-10-CM | POA: Diagnosis not present

## 2022-08-13 NOTE — Progress Notes (Signed)
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  Patient's Home Medications on Admission:  Current Outpatient Medications:    acetaminophen (TYLENOL) 500 MG tablet, Take 1,000 mg by mouth every 6 (six) hours as needed for headache or moderate pain., Disp: , Rfl:    aspirin EC 325 MG tablet, Take 1 tablet (325 mg total) by mouth daily., Disp: , Rfl:    docusate sodium (COLACE) 100 MG capsule, Take 200 mg by mouth daily., Disp: , Rfl:    fluticasone (FLONASE) 50 MCG/ACT nasal spray, Place 2 sprays into both nostrils daily as needed for allergies or rhinitis., Disp: , Rfl:    loratadine (CLARITIN) 10 MG tablet, Take 10 mg by mouth daily as needed for allergies., Disp: , Rfl:    oxyCODONE (OXY IR/ROXICODONE) 5 MG immediate release tablet, Take 1 tablet (5 mg total) by mouth every 4 (four) hours as needed for severe pain., Disp: 28 tablet, Rfl: 0  Past Medical History: Past Medical History:  Diagnosis Date   Allergy    Anemia    Chicken pox    Complication of anesthesia    hypotensive post-op   Congenital nasal septum deviation    COVID 06/2021   Headache    Heart murmur    History of tonsillitis    Hypoglycemic disorder    Hx passing out with hypoglycemic   Pneumonia    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   Sinusitis    Thyroid nodule    UTI (urinary tract infection)     Tobacco Use: Social History   Tobacco Use  Smoking Status  Never  Smokeless Tobacco Never    Labs: Review Flowsheet  More data exists      Latest Ref Rng & Units 06/24/2019 12/24/2021 01/31/2022 05/17/2022 05/21/2022  Labs for ITP Cardiac and Pulmonary Rehab  Cholestrol 0 - 200 mg/dL 180  170  - - -  LDL (calc) 0 - 99 mg/dL 111  85  - - -  HDL-C >39.00 mg/dL 59.60  75.10  - - -  Trlycerides 0.0 - 149.0 mg/dL 45.0  52.0  - - -  Hemoglobin A1c 4.8 - 5.6 % 5.3  5.4  - 5.2  -  PH, Arterial 7.35 - 7.45 - - 7.356  7.39  7.344  7.321  7.343  7.370  7.397  7.417  7.456  7.379  7.517  7.464  7.346   PCO2 arterial 32 - 48 mmHg - - 45.9  44  39.7  46.6  41.0  40.9  43.9  41.1  36.7  49.5  35.6  38.2  50.9   Bicarbonate 20.0 - 28.0 mmol/L - - 26.0  26.1  25.5  24.7  24.7  25.3  26.0  26.4  24.5  25.7  26.6  21.6  24.1  22.8  23.6  27.0  26.5  25.9  29.2  28.8  27.5  27.8   TCO2 22 - 32  mmol/L - - '27  28  27  26  26  27  28  28  26  27  '$ - '23  26  24  25  26  28  26  28  27  26  31  30  29  25  24  28  29   '$ Acid-base deficit 0.0 - 2.0 mmol/L - - 1.0  1.0  2.0  1.0  1.0  1.0  1.0  - 4.0  2.0  3.0  2.0   O2 Saturation % - - 75  76  73  76  75  71  71  77  96  96  99.3  99  99  98  100  100  100  100  100  100  97  100     Capillary Blood Glucose: Lab Results  Component Value Date   GLUCAP 108 (H) 05/23/2022   GLUCAP 109 (H) 05/22/2022   GLUCAP 142 (H) 05/22/2022   GLUCAP 110 (H) 05/22/2022   GLUCAP 99 05/22/2022     Exercise Target Goals: Exercise Program Goal: Individual exercise prescription set using results from initial 6 min walk test and THRR while considering  patient's activity barriers and safety.   Exercise Prescription Goal: Initial exercise prescription builds to 30-45 minutes a day of aerobic activity, 2-3 days per week.  Home exercise guidelines will be given to patient during program as part of exercise prescription that the participant will acknowledge.  Activity Barriers & Risk Stratification:  Activity Barriers & Cardiac Risk  Stratification - 07/16/22 1009       Activity Barriers & Cardiac Risk Stratification   Activity Barriers Back Problems    Comments Patient has a lot of back pain, more of an issue standing/bending than walking.    Cardiac Risk Stratification Low             6 Minute Walk:  6 Minute Walk     Row Name 07/16/22 0958         6 Minute Walk   Phase Initial     Distance 1375 feet     Walk Time 6 minutes     # of Rest Breaks 0     MPH 2.6     METS 4.19     RPE 12     Perceived Dyspnea  0     VO2 Peak 14.65     Symptoms No     Resting HR 100 bpm     Resting BP 108/64     Resting Oxygen Saturation  99 %     Exercise Oxygen Saturation  during 6 min walk 98 %     Max Ex. HR 110 bpm     Max Ex. BP 128/62     2 Minute Post BP 100/64              Oxygen Initial Assessment:   Oxygen Re-Evaluation:   Oxygen Discharge (Final Oxygen Re-Evaluation):   Initial Exercise Prescription:  Initial Exercise Prescription - 07/16/22 1200       Date of Initial Exercise RX and Referring Provider   Date 07/16/22    Referring Provider Josue Hector, MD    Expected Discharge Date 09/13/22      Treadmill   MPH 2.4    Grade 0    Minutes 15    METs 2.84      Recumbant Elliptical   Level 1    Minutes 15  METs 2.8      Prescription Details   Frequency (times per week) 3    Duration Progress to 30 minutes of continuous aerobic without signs/symptoms of physical distress      Intensity   THRR 40-80% of Max Heartrate 68-135    Ratings of Perceived Exertion 11-13    Perceived Dyspnea 0-4      Progression   Progression Continue to progress workloads to maintain intensity without signs/symptoms of physical distress.      Resistance Training   Training Prescription Yes    Weight 3 lbs    Reps 10-15             Perform Capillary Blood Glucose checks as needed.  Exercise Prescription Changes:   Exercise Prescription Changes     Row Name 07/22/22 1030  08/02/22 1029 08/07/22 1028         Response to Exercise   Blood Pressure (Admit) 106/60 110/70 101/70     Blood Pressure (Exercise) 118/62 128/72 112/62     Blood Pressure (Exit) 1'06/64 94/60 96/63 '$     Heart Rate (Admit) 93 bpm 100 bpm 87 bpm     Heart Rate (Exercise) 116 bpm 134 bpm 140 bpm     Heart Rate (Exit) 100 bpm 109 bpm 100 bpm     Rating of Perceived Exertion (Exercise) '11 12 14     '$ Symptoms None None None     Comments Off to a good start with exercise. -- Increased WL in Octane, recumbent elliptical and incline on TM today. Tolerated well.     Duration Continue with 30 min of aerobic exercise without signs/symptoms of physical distress. Continue with 30 min of aerobic exercise without signs/symptoms of physical distress. Continue with 30 min of aerobic exercise without signs/symptoms of physical distress.     Intensity THRR unchanged THRR unchanged THRR unchanged       Progression   Progression Continue to progress workloads to maintain intensity without signs/symptoms of physical distress. Continue to progress workloads to maintain intensity without signs/symptoms of physical distress. Continue to progress workloads to maintain intensity without signs/symptoms of physical distress.     Average METs 4.1 5.2 5.4       Resistance Training   Training Prescription Yes Yes No  Relaxation day, no weights.     Weight 3 lbs 3 lbs --     Reps 10-15 10-15 --     Time 10 Minutes 10 Minutes --       Interval Training   Interval Training No No No       Treadmill   MPH 2.4 3.1 3.1     Grade 0 2 3     Minutes '15 15 15     '$ METs 2.84 4.23 4.66       Recumbant Elliptical   Level '1 1 3     '$ Watts 89 105 108     Minutes '15 15 15     '$ METs 5.3 6.1 6.1              Exercise Comments:   Exercise Comments     Row Name 07/22/22 1123 08/02/22 1041         Exercise Comments Patient tolerated 1st session of exercise well without symptoms. Reviewed METs and goals with patient.                Exercise Goals and Review:   Exercise Goals     Row Name 07/16/22 1009  Exercise Goals   Increase Physical Activity Yes       Intervention Provide advice, education, support and counseling about physical activity/exercise needs.;Develop an individualized exercise prescription for aerobic and resistive training based on initial evaluation findings, risk stratification, comorbidities and participant's personal goals.       Expected Outcomes Short Term: Attend rehab on a regular basis to increase amount of physical activity.;Long Term: Exercising regularly at least 3-5 days a week.;Long Term: Add in home exercise to make exercise part of routine and to increase amount of physical activity.       Increase Strength and Stamina Yes       Intervention Provide advice, education, support and counseling about physical activity/exercise needs.;Develop an individualized exercise prescription for aerobic and resistive training based on initial evaluation findings, risk stratification, comorbidities and participant's personal goals.       Expected Outcomes Short Term: Increase workloads from initial exercise prescription for resistance, speed, and METs.;Short Term: Perform resistance training exercises routinely during rehab and add in resistance training at home;Long Term: Improve cardiorespiratory fitness, muscular endurance and strength as measured by increased METs and functional capacity (6MWT)       Able to understand and use rate of perceived exertion (RPE) scale Yes       Intervention Provide education and explanation on how to use RPE scale       Expected Outcomes Short Term: Able to use RPE daily in rehab to express subjective intensity level;Long Term:  Able to use RPE to guide intensity level when exercising independently       Knowledge and understanding of Target Heart Rate Range (THRR) Yes       Intervention Provide education and explanation of THRR including how  the numbers were predicted and where they are located for reference       Expected Outcomes Short Term: Able to state/look up THRR;Long Term: Able to use THRR to govern intensity when exercising independently;Short Term: Able to use daily as guideline for intensity in rehab       Able to check pulse independently Yes       Intervention Provide education and demonstration on how to check pulse in carotid and radial arteries.;Review the importance of being able to check your own pulse for safety during independent exercise       Expected Outcomes Short Term: Able to explain why pulse checking is important during independent exercise;Long Term: Able to check pulse independently and accurately       Understanding of Exercise Prescription Yes       Intervention Provide education, explanation, and written materials on patient's individual exercise prescription       Expected Outcomes Short Term: Able to explain program exercise prescription;Long Term: Able to explain home exercise prescription to exercise independently                Exercise Goals Re-Evaluation :  Exercise Goals Re-Evaluation     Row Name 07/22/22 1416 08/02/22 1041           Exercise Goal Re-Evaluation   Exercise Goals Review Increase Physical Activity;Able to check pulse independently;Able to understand and use rate of perceived exertion (RPE) scale;Increase Strength and Stamina Increase Physical Activity;Able to check pulse independently;Able to understand and use rate of perceived exertion (RPE) scale;Increase Strength and Stamina      Comments Patient able to understand and use RPE scale appropriately. Patient able to check pulse independently. Patient is making good progress with exercise. Patient's goal  is to increase strength in her chest.      Expected Outcomes Progress workloads as tolerated to help increase strength and stamina. Continue to progress workloads, increase hand weights to help increase strength.                Discharge Exercise Prescription (Final Exercise Prescription Changes):  Exercise Prescription Changes - 08/07/22 1028       Response to Exercise   Blood Pressure (Admit) 101/70    Blood Pressure (Exercise) 112/62    Blood Pressure (Exit) 96/63    Heart Rate (Admit) 87 bpm    Heart Rate (Exercise) 140 bpm    Heart Rate (Exit) 100 bpm    Rating of Perceived Exertion (Exercise) 14    Symptoms None    Comments Increased WL in Octane, recumbent elliptical and incline on TM today. Tolerated well.    Duration Continue with 30 min of aerobic exercise without signs/symptoms of physical distress.    Intensity THRR unchanged      Progression   Progression Continue to progress workloads to maintain intensity without signs/symptoms of physical distress.    Average METs 5.4      Resistance Training   Training Prescription No   Relaxation day, no weights.     Interval Training   Interval Training No      Treadmill   MPH 3.1    Grade 3    Minutes 15    METs 4.66      Recumbant Elliptical   Level 3    Watts 108    Minutes 15    METs 6.1             Nutrition:  Target Goals: Understanding of nutrition guidelines, daily intake of sodium '1500mg'$ , cholesterol '200mg'$ , calories 30% from fat and 7% or less from saturated fats, daily to have 5 or more servings of fruits and vegetables.  Biometrics:  Pre Biometrics - 07/16/22 0936       Pre Biometrics   Waist Circumference 30.5 inches    Hip Circumference 37.5 inches    Waist to Hip Ratio 0.81 %    Triceps Skinfold 32 mm    % Body Fat 35.7 %    Grip Strength 26 kg    Flexibility 15.25 in    Single Leg Stand 30 seconds              Nutrition Therapy Plan and Nutrition Goals:  Nutrition Therapy & Goals - 07/22/22 1122       Nutrition Therapy   Diet Heart Healthy Diet      Personal Nutrition Goals   Nutrition Goal Patient to identify strategies for reducing cardiovascular risk by attending the Pritikin  education and nutrition series    Personal Goal #2 Patient to use the plate method as a daily guide for meal planning to aid with diet quality to include lean protein/plant protein, fruits, vegetables, whole grains, and nonfat dairy as part of well balanced diet.    Comments Patient enjoys a wide variety of foods. A1c and lipids WNL; history of iron deficiency anemia which is improved.      Intervention Plan   Intervention Prescribe, educate and counsel regarding individualized specific dietary modifications aiming towards targeted core components such as weight, hypertension, lipid management, diabetes, heart failure and other comorbidities.;Nutrition handout(s) given to patient.    Expected Outcomes Short Term Goal: Understand basic principles of dietary content, such as calories, fat, sodium, cholesterol and nutrients.;Long Term Goal: Adherence  to prescribed nutrition plan.             Nutrition Assessments:  Nutrition Assessments - 07/16/22 1122       Rate Your Plate Scores   Pre Score 62            MEDIFICTS Score Key: ?70 Need to make dietary changes  40-70 Heart Healthy Diet ? 40 Therapeutic Level Cholesterol Diet   Flowsheet Row INTENSIVE CARDIAC REHAB ORIENT from 07/16/2022 in [facility]  Picture Your Plate Total Score on Admission 62      Picture Your Plate Scores: <62 Unhealthy dietary pattern with much room for improvement. 41-50 Dietary pattern unlikely to meet recommendations for good health and room for improvement. 51-60 More healthful dietary pattern, with some room for improvement.  >60 Healthy dietary pattern, although there may be some specific behaviors that could be improved.    Nutrition Goals Re-Evaluation:  Nutrition Goals Re-Evaluation     [place] Name 07/22/22 1122             Goals   Current Weight 145 lb 4.5 oz (65.9 kg)       Comment A1c WNL, Lipids WNL, CBC WNL       Expected  Outcome Patient enjoys a wide variety of foods. A1c and lipids WNL; history of iron deficiency anemia which is improved.                Nutrition Goals Re-Evaluation:  Nutrition Goals Re-Evaluation     [place] Name 07/22/22 1122             Goals   Current Weight 145 lb 4.5 oz (65.9 kg)       Comment A1c WNL, Lipids WNL, CBC WNL       Expected Outcome Patient enjoys a wide variety of foods. A1c and lipids WNL; history of iron deficiency anemia which is improved.                Nutrition Goals Discharge (Final Nutrition Goals Re-Evaluation):  Nutrition Goals Re-Evaluation - 07/22/22 1122       Goals   Current Weight 145 lb 4.5 oz (65.9 kg)    Comment A1c WNL, Lipids WNL, CBC WNL    Expected Outcome Patient enjoys a wide variety of foods. A1c and lipids WNL; history of iron deficiency anemia which is improved.             Psychosocial: Target Goals: Acknowledge presence or absence of significant depression and/or stress, maximize coping skills, provide positive support system. Participant is able to verbalize types and ability to use techniques and skills needed for reducing stress and depression.  Initial Review & Psychosocial Screening:  Initial Psych Review & Screening - 07/16/22 1053       Initial Review   Current issues with None Identified      Family Dynamics   Good Support System? Yes   Jamariah has her husband for support     Barriers   Psychosocial barriers to participate in program There are no identifiable barriers or psychosocial needs.      Screening Interventions   Interventions Encouraged to exercise             Quality of Life Scores:  Quality of Life - 07/16/22 1105       Quality of Life   Select Quality of Life      Quality of Life Scores   Health/Function Pre  27.27 %    Socioeconomic Pre 27.43 %    Psych/Spiritual Pre 28.07 %    Family Pre 30 %    GLOBAL Pre 27.87 %            Scores of 19 and below usually indicate a  poorer quality of life in these areas.  A difference of  2-3 points is a clinically meaningful difference.  A difference of 2-3 points in the total score of the Quality of Life Index has been associated with significant improvement in overall quality of life, self-image, physical symptoms, and general health in studies assessing change in quality of life.  PHQ-9: Review Flowsheet       07/16/2022 12/24/2021 12/04/2021 06/11/2018  Depression screen PHQ 2/9  Decreased Interest 0 0 0 0  Down, Depressed, Hopeless 0 0 0 0  PHQ - 2 Score 0 0 0 0   Interpretation of Total Score  Total Score Depression Severity:  1-4 = Minimal depression, 5-9 = Mild depression, 10-14 = Moderate depression, 15-19 = Moderately severe depression, 20-27 = Severe depression   Psychosocial Evaluation and Intervention:   Psychosocial Re-Evaluation:  Psychosocial Re-Evaluation     [place] Name 07/22/22 1514 08/13/22 1027           Psychosocial Re-Evaluation   Current issues with None Identified None Identified      Interventions Encouraged to attend Cardiac Rehabilitation for the exercise Encouraged to attend Cardiac Rehabilitation for the exercise      Continue Psychosocial Services  No Follow up required No Follow up required               Psychosocial Discharge (Final Psychosocial Re-Evaluation):  Psychosocial Re-Evaluation - 08/13/22 1027       Psychosocial Re-Evaluation   Current issues with None Identified    Interventions Encouraged to attend Cardiac Rehabilitation for the exercise    Continue Psychosocial Services  No Follow up required             Vocational Rehabilitation: Provide vocational rehab assistance to qualifying candidates.   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 Participant will state understanding/return demonstration of topics presented.    Education     Row Name 07/22/22 1222     Education   Cardiac Education Topics Pritikin   Select Core Videos     Core Videos   Educator Exercise Physiologist   Select Exercise Education   Exercise Education Biomechanial Limitations   Instruction Review Code 1- Verbalizes Understanding   Class Start Time 1142   Class Stop Time 1222   Class Time Calculation (min) 40 min    [place] Name 07/24/22 1300     Education   Cardiac Education Topics Pritikin   Financial trader   Weekly Topic Fast Evening Meals  Butternut Squash Soup   Instruction Review Code 1- Verbalizes Understanding   Class Start Time 1135   Class Stop Time 1217   Class Time Calculation (min) 42 min    [place] Name 08/07/22  1200     Education   Cardiac Education Topics Pritikin   Academic librarian Nutrition   Nutrition Cooking - Healthy Salads and Dressing   Instruction Review Code 1- Verbalizes Understanding   Class Start Time 1135   Class Stop Time 1220   Class Time Calculation (min) 45 min            Core Videos: Exercise    Move It!  Clinical staff conducted group or individual video education with verbal and written material and guidebook.  Patient learns the recommended Pritikin exercise program. Exercise with the goal of living a long, healthy life. Some of the health benefits of exercise include controlled diabetes, healthier blood pressure levels, improved cholesterol levels, improved heart and lung capacity, improved sleep, and better body composition. Everyone should speak with their doctor before starting or changing an exercise  routine.  Biomechanical Limitations Clinical staff conducted group or individual video education with verbal and written material and guidebook.  Patient learns how biomechanical limitations can impact exercise and how we can mitigate and possibly overcome limitations to have an impactful and balanced exercise routine.  Body Composition Clinical staff conducted group or individual video education with verbal and written material and guidebook.  Patient learns that body composition (ratio of muscle mass to fat mass) is a key component to assessing overall fitness, rather than body weight alone. Increased fat mass, especially visceral belly fat, can put [place] at increased risk for metabolic syndrome, type 2 diabetes, heart disease, and even death. It is recommended to combine diet and exercise (cardiovascular and resistance training) to improve your body composition. Seek guidance from your physician and exercise physiologist before implementing an exercise routine.  Exercise Action Plan Clinical staff conducted group or individual video education with verbal and written material and guidebook.  Patient learns the recommended strategies to achieve and enjoy long-term exercise adherence, including variety, self-motivation, self-efficacy, and positive decision making. Benefits of exercise include fitness, good health, weight management, more energy, better sleep, less stress, and overall well-being.  Medical   Heart Disease Risk Reduction Clinical staff conducted group or individual video education with verbal and written material and guidebook.  Patient learns our heart is our most vital organ as it circulates oxygen, nutrients, white blood cells, and hormones throughout the entire body, and carries waste away. Data supports a plant-based eating plan like the Pritikin Program for its effectiveness in slowing progression of and reversing heart disease. The video provides a number of recommendations to  address heart disease.   Metabolic Syndrome and Belly Fat  Clinical staff conducted group or individual video education with verbal and written material and guidebook.  Patient learns what metabolic syndrome is, how it leads to heart disease, and how one can reverse it and keep it from coming back. You have metabolic syndrome if you have 3 of the following 5 criteria: abdominal obesity, high blood pressure, high triglycerides, low HDL cholesterol, and high blood sugar.  Hypertension and Heart Disease Clinical staff conducted group or individual video education with verbal and written material and guidebook.  Patient learns that high blood pressure, or hypertension, is very common in the [place]. Hypertension is largely due to excessive salt intake, but other important risk factors include being overweight, physical inactivity, drinking too much alcohol, smoking, and not eating enough potassium from fruits and vegetables. High blood pressure is a leading risk factor for  heart attack, stroke, congestive heart failure, dementia, kidney failure, and premature death. Long-term effects of excessive salt intake include stiffening of the arteries and thickening of heart muscle and organ damage. Recommendations include ways to reduce hypertension and the risk of heart disease.  Diseases of Our Time - Focusing on Diabetes Clinical staff conducted group or individual video education with verbal and written material and guidebook.  Patient learns why the best way to stop diseases of our time is prevention, through food and other lifestyle changes. Medicine (such as prescription pills and surgeries) is often only a Band-Aid on the problem, not a long-term solution. Most common diseases of our time include obesity, type 2 diabetes, hypertension, heart disease, and cancer. The Pritikin Program is recommended and has been proven to help reduce, reverse, and/or prevent the damaging effects of metabolic  syndrome.  Nutrition   Overview of the Pritikin Eating Plan  Clinical staff conducted group or individual video education with verbal and written material and guidebook.  Patient learns about the [place] for disease risk reduction. The [place] emphasizes a wide variety of unrefined, minimally-processed carbohydrates, like fruits, vegetables, whole grains, and legumes. Go, Caution, and Stop food choices are explained. Plant-based and lean animal proteins are emphasized. Rationale provided for low sodium intake for blood pressure control, low added sugars for blood sugar stabilization, and low added fats and oils for coronary artery disease risk reduction and weight management.  Calorie Density  Clinical staff conducted group or individual video education with verbal and written material and guidebook.  Patient learns about calorie density and how it impacts the Pritikin Eating Plan. Knowing the characteristics of the food you choose will help you decide whether those foods will lead to weight gain or weight loss, and whether you want to consume more or less of them. Weight loss is usually a side effect of the Pritikin Eating Plan because of its focus on low calorie-dense foods.  Label Reading  Clinical staff conducted group or individual video education with verbal and written material and guidebook.  Patient learns about the Pritikin recommended label reading guidelines and corresponding recommendations regarding calorie density, added sugars, sodium content, and whole grains.  Dining Out - Part 1  Clinical staff conducted group or individual video education with verbal and written material and guidebook.  Patient learns that restaurant meals can be sabotaging because they can be so high in calories, fat, sodium, and/or sugar. Patient learns recommended strategies on how to positively address this and avoid unhealthy pitfalls.  Facts on Fats  Clinical staff conducted  group or individual video education with verbal and written material and guidebook.  Patient learns that lifestyle modifications can be just as effective, if not more so, as many medications for lowering your risk of heart disease. A Pritikin lifestyle can help to reduce your risk of inflammation and atherosclerosis (cholesterol build-up, or plaque, in the artery walls). Lifestyle interventions such as dietary choices and physical activity address the cause of atherosclerosis. A review of the types of fats and their impact on blood cholesterol levels, along with dietary recommendations to reduce fat intake is also included.  Nutrition Action Plan  Clinical staff conducted group or individual video education with verbal and written material and guidebook.  Patient learns how to incorporate Pritikin recommendations into their lifestyle. Recommendations include planning and keeping personal health goals in mind as an important part of their success.  Healthy Mind-Set    Healthy Minds, Bodies, Hearts  Clinical staff conducted group or individual video education with verbal and written material and guidebook.  Patient learns how to identify when they are stressed. Video will discuss the impact of that stress, as well as the many benefits of stress management. Patient will also be introduced to stress management techniques. The way we think, act, and feel has an impact on our hearts.  How Our Thoughts Can Heal Our Hearts  Clinical staff conducted group or individual video education with verbal and written material and guidebook.  Patient learns that negative thoughts can cause depression and anxiety. This can result in negative lifestyle behavior and serious health problems. Cognitive behavioral therapy is an effective method to help control our thoughts in order to change and improve our emotional outlook.  Additional Videos:  Exercise    Improving Performance  Clinical staff conducted group or  individual video education with verbal and written material and guidebook.  Patient learns to use a non-linear approach by alternating intensity levels and lengths of time spent exercising to help burn more calories and lose more body fat. Cardiovascular exercise helps improve heart health, metabolism, hormonal balance, blood sugar control, and recovery from fatigue. Resistance training improves strength, endurance, balance, coordination, reaction time, metabolism, and muscle mass. Flexibility exercise improves circulation, posture, and balance. Seek guidance from your physician and exercise physiologist before implementing an exercise routine and learn your capabilities and proper form for all exercise.  Introduction to Yoga  Clinical staff conducted group or individual video education with verbal and written material and guidebook.  Patient learns about yoga, a discipline of the coming together of mind, breath, and body. The benefits of yoga include improved flexibility, improved range of motion, better posture and core strength, increased lung function, weight loss, and positive self-image. Yoga's heart health benefits include lowered blood pressure, healthier heart rate, decreased cholesterol and triglyceride levels, improved immune function, and reduced stress. Seek guidance from your physician and exercise physiologist before implementing an exercise routine and learn your capabilities and proper form for all exercise.  Medical   Aging: Enhancing Your Quality of Life  Clinical staff conducted group or individual video education with verbal and written material and guidebook.  Patient learns key strategies and recommendations to stay in good physical health and enhance quality of life, such as prevention strategies, having an advocate, securing a [place], and keeping a list of medications and system for tracking them. It also discusses how to avoid risk for bone  loss.  Biology of Weight Control  Clinical staff conducted group or individual video education with verbal and written material and guidebook.  Patient learns that weight gain occurs because we consume more calories than we burn (eating more, moving less). Even if your body weight is normal, you may have higher ratios of fat compared to muscle mass. Too much body fat puts you at increased risk for cardiovascular disease, heart attack, stroke, type 2 diabetes, and obesity-related cancers. In addition to exercise, following the [place] can help reduce your risk.  Decoding Lab Results  Clinical staff conducted group or individual video education with verbal and written material and guidebook.  Patient learns that lab test reflects one measurement whose values change over time and are influenced by many factors, including medication, stress, sleep, exercise, food, hydration, pre-existing medical conditions, and more. It is recommended to use the knowledge from this video to become more involved with your lab results and evaluate your numbers to  speak with your doctor.   Diseases of Our Time - Overview  Clinical staff conducted group or individual video education with verbal and written material and guidebook.  Patient learns that according to the CDC, 50% to 70% of chronic diseases (such as obesity, type 2 diabetes, elevated lipids, hypertension, and heart disease) are avoidable through lifestyle improvements including healthier food choices, listening to satiety cues, and increased physical activity.  Sleep Disorders Clinical staff conducted group or individual video education with verbal and written material and guidebook.  Patient learns how good quality and duration of sleep are important to overall health and well-being. Patient also learns about sleep disorders and how they impact health along with recommendations to address them, including discussing with a physician.  Nutrition   Dining Out - Part 2 Clinical staff conducted group or individual video education with verbal and written material and guidebook.  Patient learns how to plan ahead and communicate in order to maximize their dining experience in a healthy and nutritious manner. Included are recommended food choices based on the type of restaurant the patient is visiting.   Fueling a Best boy conducted group or individual video education with verbal and written material and guidebook.  There is a strong connection between our food choices and our health. Diseases like obesity and type 2 diabetes are very prevalent and are in large-part due to lifestyle choices. The Pritikin Eating Plan provides plenty of food and hunger-curbing satisfaction. It is easy to follow, affordable, and helps reduce health risks.  Menu Workshop  Clinical staff conducted group or individual video education with verbal and written material and guidebook.  Patient learns that restaurant meals can sabotage health goals because they are often packed with calories, fat, sodium, and sugar. Recommendations include strategies to plan ahead and to communicate with the manager, chef, or server to help order a healthier meal.  Planning Your Eating Strategy  Clinical staff conducted group or individual video education with verbal and written material and guidebook.  Patient learns about the [place] and its benefit of reducing the risk of disease. The [place] does not focus on calories. Instead, it emphasizes high-quality, nutrient-rich foods. By knowing the characteristics of the foods, we choose, we can determine their calorie density and make informed decisions.  Targeting Your Nutrition Priorities  Clinical staff conducted group or individual video education with verbal and written material and guidebook.  Patient learns that lifestyle habits have a tremendous impact on disease risk and progression. This  video provides eating and physical activity recommendations based on your personal health goals, such as reducing LDL cholesterol, losing weight, preventing or controlling type 2 diabetes, and reducing high blood pressure.  Vitamins and Minerals  Clinical staff conducted group or individual video education with verbal and written material and guidebook.  Patient learns different ways to obtain key vitamins and minerals, including through a recommended healthy diet. It is important to discuss all supplements you take with your doctor.   Healthy Mind-Set    Smoking Cessation  Clinical staff conducted group or individual video education with verbal and written material and guidebook.  Patient learns that cigarette smoking and tobacco addiction pose a serious health risk which affects millions of people. Stopping smoking will significantly reduce the risk of heart disease, lung disease, and many forms of cancer. Recommended strategies for quitting are covered, including working with your doctor to develop a successful plan.  Culinary   Becoming a Pensions consultant  Clinical staff conducted group or individual video education with verbal and written material and guidebook.  Patient learns that cooking at home can be healthy, cost-effective, quick, and puts them in control. Keys to cooking healthy recipes will include looking at your recipe, assessing your equipment needs, planning ahead, making it simple, choosing cost-effective seasonal ingredients, and limiting the use of added fats, salts, and sugars.  Cooking - Breakfast and Snacks  Clinical staff conducted group or individual video education with verbal and written material and guidebook.  Patient learns how important breakfast is to satiety and nutrition through the entire day. Recommendations include key foods to eat during breakfast to help stabilize blood sugar levels and to prevent overeating at meals later in the day. Planning ahead is also a  key component.  Cooking - Human resources officer conducted group or individual video education with verbal and written material and guidebook.  Patient learns eating strategies to improve overall health, including an approach to cook more at home. Recommendations include thinking of animal protein as a side on your plate rather than center stage and focusing instead on lower calorie dense options like vegetables, fruits, whole grains, and plant-based proteins, such as beans. Making sauces in large quantities to freeze for later and leaving the skin on your vegetables are also recommended to maximize your experience.  Cooking - Healthy Salads and Dressing Clinical staff conducted group or individual video education with verbal and written material and guidebook.  Patient learns that vegetables, fruits, whole grains, and legumes are the foundations of the [place]. Recommendations include how to incorporate each of these in flavorful and healthy salads, and how to create homemade salad dressings. Proper handling of ingredients is also covered. Cooking - Soups and [employer] - Soups and Desserts Clinical staff conducted group or individual video education with verbal and written material and guidebook.  Patient learns that Pritikin soups and desserts make for easy, nutritious, and delicious snacks and meal components that are low in sodium, fat, sugar, and calorie density, while high in vitamins, minerals, and filling fiber. Recommendations include simple and healthy ideas for soups and desserts.   Overview     The Pritikin Solution Program Overview Clinical staff conducted group or individual video education with verbal and written material and guidebook.  Patient learns that the results of the [place] Program have been documented in more than 100 articles published in peer-reviewed journals, and the benefits include reducing risk factors for (and, in some cases, even  reversing) high cholesterol, high blood pressure, type 2 diabetes, obesity, and more! An overview of the three key pillars of the Pritikin Program will be covered: eating well, doing regular exercise, and having a healthy mind-set.  WORKSHOPS  Exercise: Exercise Basics: Building Your Action Plan Clinical staff led group instruction and group discussion with PowerPoint presentation and patient guidebook. To enhance the learning environment the use of posters, models and videos may be added. At the conclusion of this workshop, patients will comprehend the difference between physical activity and exercise, as well as the benefits of incorporating both, into their routine. Patients will understand the FITT (Frequency, Intensity, Time, and Type) principle and how to use it to build an exercise action plan. In addition, safety concerns and other considerations for exercise and cardiac rehab will be addressed by the presenter. The purpose of this lesson is to promote a comprehensive and effective weekly exercise routine in order to improve patients' overall level of fitness.  Managing Heart Disease: Your Path to a Healthier Heart Clinical staff led group instruction and group discussion with PowerPoint presentation and patient guidebook. To enhance the learning environment the use of posters, models and videos may be added.At the conclusion of this workshop, patients will understand the anatomy and physiology of the heart. Additionally, they will understand how Pritikin's three pillars impact the risk factors, the progression, and the management of heart disease.  The purpose of this lesson is to provide a high-level overview of the heart, heart disease, and how the Pritikin lifestyle positively impacts risk factors.  Exercise Biomechanics Clinical staff led group instruction and group discussion with PowerPoint presentation and patient guidebook. To enhance the learning environment the use of  posters, models and videos may be added. Patients will learn how the structural parts of their bodies function and how these functions impact their daily activities, movement, and exercise. Patients will learn how to promote a neutral spine, learn how to manage pain, and identify ways to improve their physical movement in order to promote healthy living. The purpose of this lesson is to expose patients to common physical limitations that impact physical activity. Participants will learn practical ways to adapt and manage aches and pains, and to minimize their effect on regular exercise. Patients will learn how to maintain good posture while sitting, walking, and lifting.  Balance Training and Fall Prevention  Clinical staff led group instruction and group discussion with PowerPoint presentation and patient guidebook. To enhance the learning environment the use of posters, models and videos may be added. At the conclusion of this workshop, patients will understand the importance of their sensorimotor skills (vision, proprioception, and the vestibular system) in maintaining their ability to balance as they age. Patients will apply a variety of balancing exercises that are appropriate for their current level of function. Patients will understand the common causes for poor balance, possible solutions to these problems, and ways to modify their physical environment in order to minimize their fall risk. The purpose of this lesson is to teach patients about the importance of maintaining balance as they age and ways to minimize their risk of falling.  WORKSHOPS   Nutrition:  Fueling a Scientist, research (physical sciences) led group instruction and group discussion with PowerPoint presentation and patient guidebook. To enhance the learning environment the use of posters, models and videos may be added. Patients will review the foundational principles of the [place] and understand what constitutes a  serving size in each of the food groups. Patients will also learn Pritikin-friendly foods that are better choices when away from home and review make-ahead meal and snack options. Calorie density will be reviewed and applied to three nutrition priorities: weight maintenance, weight loss, and weight gain. The purpose of this lesson is to reinforce (in a group setting) the key concepts around what patients are recommended to eat and how to apply these guidelines when away from home by planning and selecting Pritikin-friendly options. Patients will understand how calorie density may be adjusted for different weight management goals.  Mindful Eating  Clinical staff led group instruction and group discussion with PowerPoint presentation and patient guidebook. To enhance the learning environment the use of posters, models and videos may be added. Patients will briefly review the concepts of the [place] and the importance of low-calorie dense foods. The concept of mindful eating will be introduced as well as the importance of paying attention to internal hunger signals. Triggers for non-hunger eating and  techniques for dealing with triggers will be explored. The purpose of this lesson is to provide patients with the opportunity to review the basic principles of the [place], discuss the value of eating mindfully and how to measure internal cues of hunger and fullness using the Hunger Scale. Patients will also discuss reasons for non-hunger eating and learn strategies to use for controlling emotional eating.  Targeting Your Nutrition Priorities Clinical staff led group instruction and group discussion with PowerPoint presentation and patient guidebook. To enhance the learning environment the use of posters, models and videos may be added. Patients will learn how to determine their genetic susceptibility to disease by reviewing their family history. Patients will gain insight into the  importance of diet as part of an overall healthy lifestyle in mitigating the impact of genetics and other environmental insults. The purpose of this lesson is to provide patients with the opportunity to assess their personal nutrition priorities by looking at their family history, their own health history and current risk factors. Patients will also be able to discuss ways of prioritizing and modifying the [place] for their highest risk areas  Menu  Clinical staff led group instruction and group discussion with PowerPoint presentation and patient guidebook. To enhance the learning environment the use of posters, models and videos may be added. Using menus brought in from [employer], or printed from [employer], patients will apply the [place] dining out guidelines that were presented in the [employer] video. Patients will also be able to practice these guidelines in a variety of provided scenarios. The purpose of this lesson is to provide patients with the opportunity to practice hands-on learning of the [place] with actual menus and practice scenarios.  Label Reading Clinical staff led group instruction and group discussion with PowerPoint presentation and patient guidebook. To enhance the learning environment the use of posters, models and videos may be added. Patients will review and discuss the Pritikin label reading guidelines presented in Pritikin's Label Reading Educational series video. Using fool labels brought in from local grocery stores and markets, patients will apply the label reading guidelines and determine if the packaged food meet the Pritikin guidelines. The purpose of this lesson is to provide patients with the opportunity to review, discuss, and practice hands-on learning of the Pritikin Label Reading guidelines with actual packaged food labels. [place] Workshops are designed to teach  patients ways to prepare quick, simple, and affordable recipes at home. The importance of nutrition's role in chronic disease risk reduction is reflected in its emphasis in the overall Pritikin program. By learning how to prepare essential core Pritikin Eating Plan recipes, patients will increase control over what they eat; be able to customize the flavor of foods without the use of added salt, sugar, or fat; and improve the quality of the food they consume. By learning a set of core recipes which are easily assembled, quickly prepared, and affordable, patients are more likely to prepare more healthy foods at home. These workshops focus on convenient breakfasts, simple entres, side dishes, and desserts which can be prepared with minimal effort and are consistent with nutrition recommendations for cardiovascular risk reduction. Cooking [employer] are taught by a Engineer, materials (RD) who has been trained by the [employer]. The chef or RD has a clear understanding of the importance of minimizing - if not completely eliminating - added fat, sugar, and sodium in  recipes. Throughout the series of [place] Workshop sessions, patients will learn about healthy ingredients and efficient methods of cooking to build confidence in their capability to prepare    Cooking School weekly topics:  Adding Flavor- Sodium-Free  Fast and Healthy Breakfasts  Powerhouse Plant-Based Proteins  Satisfying Salads and Dressings  Simple Sides and Sauces  International Cuisine-Spotlight on the [place]ones  Delicious Desserts  Savory Soups  Efficiency Cooking - Meals in a Snap  Tasty Appetizers and Snacks  Comforting Weekend Breakfasts  One-Pot Wonders   Fast Evening Meals  Easy [place] (Psychosocial): New Thoughts, New Behaviors Clinical staff led group instruction and group discussion with PowerPoint presentation  and patient guidebook. To enhance the learning environment the use of posters, models and videos may be added. Patients will learn and practice techniques for developing effective health and lifestyle goals. Patients will be able to effectively apply the goal setting process learned to develop at least one new personal goal.  The purpose of this lesson is to expose patients to a new skill set of behavior modification techniques such as techniques setting SMART goals, overcoming barriers, and achieving new thoughts and new behaviors.  Managing Moods and Relationships Clinical staff led group instruction and group discussion with PowerPoint presentation and patient guidebook. To enhance the learning environment the use of posters, models and videos may be added. Patients will learn how emotional and chronic stress factors can impact their health and relationships. They will learn healthy ways to manage their moods and utilize positive coping mechanisms. In addition, ICR patients will learn ways to improve communication skills. The purpose of this lesson is to expose patients to ways of understanding how one's mood and health are intimately connected. Developing a healthy outlook can help build positive relationships and connections with others. Patients will understand the importance of utilizing effective communication skills that include actively listening and being heard. They will learn and understand the importance of the "4 Cs" and especially Connections in fostering of a Healthy Mind-Set.  Healthy Sleep for a Healthy Heart Clinical staff led group instruction and group discussion with PowerPoint presentation and patient guidebook. To enhance the learning environment the use of posters, models and videos may be added. At the conclusion of this workshop, patients will be able to demonstrate knowledge of the importance of sleep to overall health, well-being, and quality of life. They will understand the  symptoms of, and treatments for, common sleep disorders. Patients will also be able to identify daytime and nighttime behaviors which impact sleep, and they will be able to apply these tools to help manage sleep-related challenges. The purpose of this lesson is to provide patients with a general overview of sleep and outline the importance of quality sleep. Patients will learn about a few of the most common sleep disorders. Patients will also be introduced to the concept of "sleep hygiene," and discover ways to self-manage certain sleeping problems through simple daily behavior changes. Finally, the workshop will motivate patients by clarifying the links between quality sleep and their goals of heart-healthy living.   Recognizing and Reducing Stress Clinical staff led group instruction and group discussion with PowerPoint presentation and patient guidebook. To enhance the learning environment the use of posters, models and videos may be added. At the conclusion of this workshop, patients will be able to understand the types of stress reactions, differentiate between acute and chronic stress, and recognize the impact that  chronic stress has on their health. They will also be able to apply different coping mechanisms, such as reframing negative self-talk. Patients will have the opportunity to practice a variety of stress management techniques, such as deep abdominal breathing, progressive muscle relaxation, and/or guided imagery.  The purpose of this lesson is to educate patients on the role of stress in their lives and to provide healthy techniques for coping with it.  Learning Barriers/Preferences:  Learning Barriers/Preferences - 07/16/22 1053       Learning Barriers/Preferences   Learning Barriers None    Learning Preferences Pictoral;Video             Education Topics:  Knowledge Questionnaire Score:  Knowledge Questionnaire Score - 07/16/22 1056       Knowledge Questionnaire Score   Pre  Score 20/24             Core Components/Risk Factors/Patient Goals at Admission:  Personal Goals and Risk Factors at Admission - 07/16/22 1105       Core Components/Risk Factors/Patient Goals on Admission   Personal Goal Other Yes    Personal Goal Build chest muscles.    Intervention Provide individualized exercise program including resistance training to help build upper body strength.    Expected Outcomes Increased upper body strength as measured by grip strength and self-report.             Core Components/Risk Factors/Patient Goals Review:   Goals and Risk Factor Review     Row Name 07/22/22 1516 08/13/22 1027           Core Components/Risk Factors/Patient Goals Review   Personal Goals Review Weight Management/Obesity Weight Management/Obesity      Review Stepheny started intensive cardiac rehab on12/11/23 and did well with exercise Deeanna does  well with exercise at intensive cardiac rehab when in attendance. Vital signs have been stable. Earlisha says intensive cardiac rehab has been helpful for her so far.      Expected Outcomes Takia will continue to participate in intensive cardiac rehab for nutrition, exercise and lifestyle modifcations Abygail will continue to participate in intensive cardiac rehab for nutrition, exercise and lifestyle modifcations               Core Components/Risk Factors/Patient Goals at Discharge (Final Review):   Goals and Risk Factor Review - 08/13/22 1027       Core Components/Risk Factors/Patient Goals Review   Personal Goals Review Weight Management/Obesity    Review Yanin does  well with exercise at intensive cardiac rehab when in attendance. Vital signs have been stable. Lilyahna says intensive cardiac rehab has been helpful for her so far.    Expected Outcomes Avah will continue to participate in intensive cardiac rehab for nutrition, exercise and lifestyle modifcations             ITP Comments:  ITP Comments      Row Name 07/16/22 0936 07/22/22 1512 08/13/22 1025       ITP Comments Medical Director- Dr. Sherre Scarlet, MD, Introduction to [facility] Education program/ Intensive cardiac Rehab. Initial orientation packet reviewed with the patient. 30 Day ITP Review. Anara started intensive cardiac rehab on 07/22/22 and did well with exercise 30 Day ITP Review. Avonne has good participation when in attendance and is doing well with exercise at intensive cardiac rehab.Charlotte Sanes was out with and URI.              Comments: See ITP Comments

## 2022-08-14 ENCOUNTER — Encounter (HOSPITAL_COMMUNITY)
Admission: RE | Admit: 2022-08-14 | Discharge: 2022-08-14 | Disposition: A | Discharge status: Home / Self Care | Payer: Commercial Managed Care - PPO | Source: Ambulatory Visit | Attending: Cardiovascular Disease | Admitting: Cardiovascular Disease

## 2022-08-14 DIAGNOSIS — Z952 Presence of prosthetic heart valve: Secondary | ICD-10-CM

## 2022-08-14 DIAGNOSIS — Z48812 Encounter for surgical aftercare following surgery on the circulatory system: Secondary | ICD-10-CM | POA: Diagnosis not present

## 2022-08-14 DIAGNOSIS — Z95828 Presence of other vascular implants and grafts: Secondary | ICD-10-CM

## 2022-08-16 ENCOUNTER — Telehealth (HOSPITAL_COMMUNITY): Payer: Self-pay | Admitting: *Deleted

## 2022-08-16 ENCOUNTER — Encounter (HOSPITAL_COMMUNITY): Payer: Commercial Managed Care - PPO

## 2022-08-16 ENCOUNTER — Encounter: Payer: Self-pay | Admitting: Surgery

## 2022-08-16 NOTE — Telephone Encounter (Signed)
Spoke with Romanita she was feeling dizzy earlier but feels better now.Harrell Gave RN BSN

## 2022-08-19 ENCOUNTER — Encounter (HOSPITAL_COMMUNITY)
Admission: RE | Admit: 2022-08-19 | Discharge: 2022-08-19 | Disposition: A | Discharge status: Home / Self Care | Payer: Commercial Managed Care - PPO | Source: Ambulatory Visit | Attending: Cardiovascular Disease | Admitting: Cardiovascular Disease

## 2022-08-19 DIAGNOSIS — Z952 Presence of prosthetic heart valve: Secondary | ICD-10-CM | POA: Diagnosis not present

## 2022-08-19 DIAGNOSIS — Z95828 Presence of other vascular implants and grafts: Secondary | ICD-10-CM

## 2022-08-19 DIAGNOSIS — Z48812 Encounter for surgical aftercare following surgery on the circulatory system: Secondary | ICD-10-CM | POA: Diagnosis not present

## 2022-08-19 NOTE — Progress Notes (Addendum)
Reviewed home exercise guidelines with patient including endpoints, temperature precautions, target heart rate and rate of perceived exertion. Patient is currently walking 45-60 minutes daily and works out to [employer] as her mode of home exercise. Patient has 3 lb weights at home that she is using for her resistance exercise. Patient voices understanding of instructions given.  Sol Passer, MS, ACSM CEP

## 2022-08-21 ENCOUNTER — Encounter (HOSPITAL_COMMUNITY)
Admission: RE | Admit: 2022-08-21 | Discharge: 2022-08-21 | Disposition: A | Discharge status: Home / Self Care | Payer: Commercial Managed Care - PPO | Source: Ambulatory Visit | Attending: Cardiovascular Disease | Admitting: Cardiovascular Disease

## 2022-08-21 DIAGNOSIS — Z952 Presence of prosthetic heart valve: Secondary | ICD-10-CM

## 2022-08-21 DIAGNOSIS — Z48812 Encounter for surgical aftercare following surgery on the circulatory system: Secondary | ICD-10-CM | POA: Diagnosis not present

## 2022-08-21 DIAGNOSIS — Z95828 Presence of other vascular implants and grafts: Secondary | ICD-10-CM

## 2022-08-23 ENCOUNTER — Encounter (HOSPITAL_COMMUNITY)
Admission: RE | Admit: 2022-08-23 | Discharge: 2022-08-23 | Disposition: A | Discharge status: Home / Self Care | Payer: Commercial Managed Care - PPO | Source: Ambulatory Visit | Attending: Cardiovascular Disease | Admitting: Cardiovascular Disease

## 2022-08-23 DIAGNOSIS — Z95828 Presence of other vascular implants and grafts: Secondary | ICD-10-CM

## 2022-08-23 DIAGNOSIS — Z952 Presence of prosthetic heart valve: Secondary | ICD-10-CM | POA: Diagnosis not present

## 2022-08-23 DIAGNOSIS — Z48812 Encounter for surgical aftercare following surgery on the circulatory system: Secondary | ICD-10-CM | POA: Diagnosis not present

## 2022-08-26 ENCOUNTER — Encounter (HOSPITAL_COMMUNITY)
Admission: RE | Admit: 2022-08-26 | Discharge: 2022-08-26 | Disposition: A | Discharge status: Home / Self Care | Payer: Commercial Managed Care - PPO | Source: Ambulatory Visit | Attending: Cardiovascular Disease | Admitting: Cardiovascular Disease

## 2022-08-26 DIAGNOSIS — Z952 Presence of prosthetic heart valve: Secondary | ICD-10-CM | POA: Diagnosis not present

## 2022-08-26 DIAGNOSIS — Z48812 Encounter for surgical aftercare following surgery on the circulatory system: Secondary | ICD-10-CM | POA: Diagnosis not present

## 2022-08-26 DIAGNOSIS — Z95828 Presence of other vascular implants and grafts: Secondary | ICD-10-CM

## 2022-08-28 ENCOUNTER — Encounter (HOSPITAL_COMMUNITY): Payer: Commercial Managed Care - PPO

## 2022-08-30 ENCOUNTER — Encounter (HOSPITAL_COMMUNITY)
Admission: RE | Admit: 2022-08-30 | Discharge: 2022-08-30 | Disposition: A | Discharge status: Home / Self Care | Payer: Commercial Managed Care - PPO | Source: Ambulatory Visit | Attending: Cardiovascular Disease | Admitting: Cardiovascular Disease

## 2022-08-30 VITALS — BP 108/64 | HR 100 | Ht 63.5 in | Wt 147.9 lb

## 2022-08-30 DIAGNOSIS — Z952 Presence of prosthetic heart valve: Secondary | ICD-10-CM

## 2022-08-30 DIAGNOSIS — Z95828 Presence of other vascular implants and grafts: Secondary | ICD-10-CM

## 2022-08-30 DIAGNOSIS — Z48812 Encounter for surgical aftercare following surgery on the circulatory system: Secondary | ICD-10-CM | POA: Diagnosis not present

## 2022-08-30 NOTE — Progress Notes (Signed)
Discharge Progress Report  Patient Details  Name: Amy Terry MRN: [id number] Date of Birth: July 08, 1971 Referring Provider:   Flowsheet Row INTENSIVE CARDIAC REHAB ORIENT from 07/16/2022 in [facility]  Referring Provider Amy Hector, MD        Number of Visits: 11 Exercise and 7 education classes   Reason for Discharge:  Patient reached a stable level of exercise. Patient independent in their exercise. Patient has met program and personal goals.  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  ADL UCSD:   Initial Exercise Prescription:  Initial Exercise Prescription - 07/16/22 1200       Date of Initial Exercise RX and Referring Provider   Date 07/16/22    Referring Provider Amy Hector, MD    Expected Discharge Date 09/13/22      Treadmill   MPH 2.4    Grade 0    Minutes 15    METs 2.84      Recumbant Elliptical   Level 1    Minutes 15    METs 2.8      Prescription Details   Frequency (times per week) 3    Duration Progress to 30 minutes of continuous aerobic without signs/symptoms of physical distress      Intensity   THRR 40-80% of Max Heartrate 68-135    Ratings of Perceived Exertion 11-13    Perceived Dyspnea 0-4      Progression   Progression Continue to progress workloads to maintain intensity without signs/symptoms of physical distress.      Resistance Training   Training Prescription Yes    Weight 3 lbs    Reps 10-15             Discharge Exercise Prescription (Final Exercise Prescription Changes):  Exercise Prescription Changes - 08/30/22 1026       Response to Exercise   Blood Pressure (Admit) 108/64    Blood Pressure (Exercise) 130/62    Blood Pressure (Exit) 96/62    Heart Rate (Admit) 100 bpm    Heart Rate  (Exercise) 142 bpm    Heart Rate (Exit) 98 bpm    Rating of Perceived Exertion (Exercise) 13    Symptoms None    Comments Patient completed the cardiac rehab program.    Duration Continue with 30 min of aerobic exercise without signs/symptoms of physical distress.    Intensity THRR unchanged      Progression   Progression Continue to progress workloads to maintain intensity without signs/symptoms of physical distress.    Average METs 5.5      Resistance Training   Training Prescription Yes    Weight 4 lbs    Reps 10-15    Time 10 Minutes      Interval Training   Interval Training No      Treadmill   MPH 3.2    Grade 3.5    Minutes 10    METs 4.99      Recumbant Elliptical   Level 3    RPM 74    Watts 110    Minutes 15    METs 6      Home Exercise Plan   Plans to continue exercise at Home (comment)   Walking and Youtube videos   Frequency Add 4 additional days to  program exercise sessions.    Initial Home Exercises Provided 08/19/22             Functional Capacity:  6 Minute Walk     Row Name 07/16/22 0958 08/30/22 1341       6 Minute Walk   Phase Initial Discharge    Distance 1375 feet 1674 feet    Distance % Change -- 21.75 %    Distance Feet Change -- 299 ft    Walk Time 6 minutes 6 minutes    # of Rest Breaks 0 0    MPH 2.6 3.17    METS 4.19 4.86    RPE 12 11    Perceived Dyspnea  0 1    VO2 Peak 14.65 17.02    Symptoms No Yes (comment)    Comments -- Patient c/o mild SOB from wearing mask during walk test, RPD=1.    Resting HR 100 bpm 100 bpm    Resting BP 108/64 108/64    Resting Oxygen Saturation  99 % --    Exercise Oxygen Saturation  during 6 min walk 98 % 99 %    Max Ex. HR 110 bpm 127 bpm    Max Ex. BP 128/62 130/62    2 Minute Post BP 100/64 96/62             Psychological, QOL, Others - Outcomes: PHQ 2/9:    09/13/2022    8:33 AM 07/16/2022   11:00 AM 12/24/2021   10:34 AM 12/04/2021   11:30 AM 06/11/2018    9:36 AM   Depression screen PHQ 2/9  Decreased Interest 0 0 0 0 0  Down, Depressed, Hopeless 0 0 0 0 0  PHQ - 2 Score 0 0 0 0 0    Quality of Life:  Quality of Life - 08/30/22 1329       Quality of Life   Select Quality of Life      Quality of Life Scores   Health/Function Pre 27.27 %    Health/Function Post 29.2 %    Health/Function % Change 7.08 %    Socioeconomic Pre 27.43 %    Socioeconomic Post 29.14 %    Socioeconomic % Change  6.23 %    Psych/Spiritual Pre 28.07 %    Psych/Spiritual Post 30 %    Psych/Spiritual % Change 6.88 %    Family Pre 30 %    Family Post 30 %    Family % Change 0 %    GLOBAL Pre 27.87 %    GLOBAL Post 29.47 %    GLOBAL % Change 5.74 %             Personal Goals: Goals established at orientation with interventions provided to work toward goal.  Personal Goals and Risk Factors at Admission - 07/16/22 1105       Core Components/Risk Factors/Patient Goals on Admission   Personal Goal Other Yes    Personal Goal Build chest muscles.    Intervention Provide individualized exercise program including resistance training to help build upper body strength.    Expected Outcomes Increased upper body strength as measured by grip strength and self-report.              Personal Goals Discharge:  Goals and Risk Factor Review     Row Name 07/22/22 1516 08/13/22 1027 09/13/22 0836         Core Components/Risk Factors/Patient Goals Review   Personal Goals Review Weight Management/Obesity Weight  Management/Obesity Weight Management/Obesity     Review Amy Terry started intensive cardiac rehab on12/11/23 and did well with exercise Amy Terry does  well with exercise at intensive cardiac rehab when in attendance. Vital signs have been stable. Amy Terry says intensive cardiac rehab has been helpful for her so far. Amy Terry did  well with exercise at intensive cardiac rehab when in attendance. Vital signs were been stable. Amy Terry completed intensive caridac rehab on  08/30/22 to return to work     Expected Amy Terry will continue to participate in intensive cardiac rehab for nutrition, exercise and lifestyle modifcations Amy Terry will continue to participate in intensive cardiac rehab for nutrition, exercise and lifestyle modifcations Amy Terry will continue to exercise, follow nutrition  and lifestyle modifcations upon completion of intensive cardiac rehab              Exercise Goals and Review:  Exercise Goals     Row Name 07/16/22 1009             Exercise Goals   Increase Physical Activity Yes       Intervention Provide advice, education, support and counseling about physical activity/exercise needs.;Develop an individualized exercise prescription for aerobic and resistive training based on initial evaluation findings, risk stratification, comorbidities and participant's personal goals.       Expected Outcomes Short Term: Attend rehab on a regular basis to increase amount of physical activity.;Long Term: Exercising regularly at least 3-5 days a week.;Long Term: Add in home exercise to make exercise part of routine and to increase amount of physical activity.       Increase Strength and Stamina Yes       Intervention Provide advice, education, support and counseling about physical activity/exercise needs.;Develop an individualized exercise prescription for aerobic and resistive training based on initial evaluation findings, risk stratification, comorbidities and participant's personal goals.       Expected Outcomes Short Term: Increase workloads from initial exercise prescription for resistance, speed, and METs.;Short Term: Perform resistance training exercises routinely during rehab and add in resistance training at home;Long Term: Improve cardiorespiratory fitness, muscular endurance and strength as measured by increased METs and functional capacity (6MWT)       Able to understand and use rate of perceived exertion (RPE) scale Yes        Intervention Provide education and explanation on how to use RPE scale       Expected Outcomes Short Term: Able to use RPE daily in rehab to express subjective intensity level;Long Term:  Able to use RPE to guide intensity level when exercising independently       Knowledge and understanding of Target Heart Rate Range (THRR) Yes       Intervention Provide education and explanation of THRR including how the numbers were predicted and where they are located for reference       Expected Outcomes Short Term: Able to state/look up THRR;Long Term: Able to use THRR to govern intensity when exercising independently;Short Term: Able to use daily as guideline for intensity in rehab       Able to check pulse independently Yes       Intervention Provide education and demonstration on how to check pulse in carotid and radial arteries.;Review the importance of being able to check your own pulse for safety during independent exercise       Expected Outcomes Short Term: Able to explain why pulse checking is important during independent exercise;Long Term: Able to check pulse independently and accurately  Understanding of Exercise Prescription Yes       Intervention Provide education, explanation, and written materials on patient's individual exercise prescription       Expected Outcomes Short Term: Able to explain program exercise prescription;Long Term: Able to explain home exercise prescription to exercise independently                Exercise Goals Re-Evaluation:  Exercise Goals Re-Evaluation     Row Name 07/22/22 1416 08/02/22 1041 08/19/22 1050 08/30/22 1145       Exercise Goal Re-Evaluation   Exercise Goals Review Increase Physical Activity;Able to check pulse independently;Able to understand and use rate of perceived exertion (RPE) scale;Increase Strength and Stamina Increase Physical Activity;Able to check pulse independently;Able to understand and use rate of perceived exertion (RPE)  scale;Increase Strength and Stamina Increase Physical Activity;Able to check pulse independently;Able to understand and use rate of perceived exertion (RPE) scale;Increase Strength and Stamina;Knowledge and understanding of Target Heart Rate Range (THRR);Understanding of Exercise Prescription Increase Physical Activity;Able to check pulse independently;Able to understand and use rate of perceived exertion (RPE) scale;Increase Strength and Stamina;Knowledge and understanding of Target Heart Rate Range (THRR);Understanding of Exercise Prescription    Comments Patient able to understand and use RPE scale appropriately. Patient able to check pulse independently. Patient is making good progress with exercise. Patient's goal is to increase strength in her chest. Reviewed exercise prescription with patient. Patient works out to [employer] and walks 45-60 minutes daily as her mode of home exercise. Patient has 3 lb weights at home that she uses for her resistance training. Patient cleared to return to work without symptoms and will complete the cardiac rehab program today. Patient progressed well achieving 5-6 METs with exercise. Functional capacity increased 22% as measured by 6MWT, strength increased 4% as measured by grip strength test. Patient is feeling stronger but still having some chest pressure from surgery. 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 Patient will continue her daily exercise routine, which includes walking and YouTube exercise workout. She 3lb weights at home for her resistance training and will be able to perform 1-3 set of 10-15 reps to continue to build strength.    Expected Outcomes Progress workloads as tolerated to help increase strength and stamina. Continue to progress workloads, increase hand weights to help increase strength. Paitent will continue daily exercise routine to help increase strength. Patient will continue daily exercise to maintain health and fitness gains.              Nutrition & Weight - Outcomes:  Pre Biometrics - 07/16/22 0936       Pre Biometrics   Waist Circumference 30.5 inches    Hip Circumference 37.5 inches    Waist to Hip Ratio 0.81 %    Triceps Skinfold 32 mm    % Body Fat 35.7 %    Grip Strength 26 kg    Flexibility 15.25 in    Single Leg Stand 30 seconds             Post Biometrics - 08/30/22 1040        Post  Biometrics   Height 5' 3.5" (1.613 m)    Waist Circumference 29.5 inches    Hip Circumference 41 inches    Waist to Hip Ratio 0.72 %    BMI (Calculated) 25.79    Triceps Skinfold 34 mm    % Body Fat 35.9 %    Grip Strength 27 kg    Flexibility  15.5 in    Single Leg Stand 30 seconds             Nutrition:  Nutrition Therapy & Goals - 08/21/22 1131       Nutrition Therapy   Diet Heart Healthy Diet      Personal Nutrition Goals   Nutrition Goal Patient to identify strategies for reducing cardiovascular risk by attending the [facility] education and nutrition series    Personal Goal #2 Patient to use the plate method as a daily guide for meal planning to aid with diet quality to include lean protein/plant protein, fruits, vegetables, whole grains, and nonfat dairy as part of well balanced diet.    Comments Goals in action. Jamyrah continues to attend the [facility] education series. She has good knowledge of heart healthy eating habits; she does continue to work on reducing added sugar in her diet. She will continue to benefit from participation in intensive cardiac rehab and adherance to the [facility] eating plan long term.      Intervention Plan   Intervention Prescribe, educate and counsel regarding individualized specific dietary modifications aiming towards targeted core components such as weight, hypertension, lipid management, diabetes, heart failure and other comorbidities.;Nutrition handout(s) given to patient.    Expected Outcomes Short Term Goal: Understand basic principles of dietary content,  such as calories, fat, sodium, cholesterol and nutrients.;Long Term Goal: Adherence to prescribed nutrition plan.             Nutrition Discharge:  Nutrition Assessments - 09/02/22 0905       Rate Your Plate Scores   Post Score 62             Education Questionnaire Score:  Knowledge Questionnaire Score - 08/30/22 1329       Knowledge Questionnaire Score   Pre Score 20/24    Post Score 24/24             Goals reviewed with patient; copy given to patient.Pt graduates from  Intensive/Traditional cardiac rehab program pn 08/30/22 with completion of   11 exercise and  7 education sessions. Pt maintained good attendance and progressed nicely during their participation in rehab as evidenced by increased MET level.   Medication list reconciled. Repeat  PHQ score-  .  Pt has made significant lifestyle changes and should be commended for their success. Maryse  achieved her goals during cardiac rehab.   Pt plans to continue exercise at by walking [place] the [facility] [place] and doing cardio videos on [employer]. Abbiegail increased her distance on her post exercise walk test by 299 feet. We are proud of Norena's progress!Barnet Pall, RN,BSN 09/13/2022 8:42 AM

## 2022-09-02 ENCOUNTER — Encounter (HOSPITAL_COMMUNITY): Payer: Commercial Managed Care - PPO

## 2022-09-04 ENCOUNTER — Encounter (HOSPITAL_COMMUNITY): Payer: Commercial Managed Care - PPO

## 2022-09-06 ENCOUNTER — Encounter (HOSPITAL_COMMUNITY): Payer: Commercial Managed Care - PPO

## 2022-09-09 ENCOUNTER — Encounter (HOSPITAL_COMMUNITY): Payer: Commercial Managed Care - PPO

## 2022-09-09 ENCOUNTER — Other Ambulatory Visit (HOSPITAL_BASED_OUTPATIENT_CLINIC_OR_DEPARTMENT_OTHER): Payer: Self-pay

## 2022-09-11 ENCOUNTER — Encounter (HOSPITAL_COMMUNITY): Payer: Commercial Managed Care - PPO

## 2022-09-13 ENCOUNTER — Encounter (HOSPITAL_COMMUNITY): Payer: Commercial Managed Care - PPO

## 2022-10-30 ENCOUNTER — Encounter: Payer: Self-pay | Admitting: Family

## 2022-11-04 NOTE — Progress Notes (Unsigned)
Cardiology Office Note   Date:  11/05/2022   ID:  Amy Terry, DOB 03-May-1971, MRN [id number]   PCP:  [name], MD  Cardiologist:  Dr Johnsie Cancel    No chief complaint on file.     History of Present Illness: Amy Terry is a 52 y.o. female F/U post AVR      07/06/18 Cardiac CTA: Aortic root 3.6 cm Calcium score 0 normal right dominant coronary arteries    Long history of iron deficiency anemia with ferritin only 2 Has not been responsive to oral iron Seen by hematology 12/31/21 set up for iv iron Hb 8.6 and has not had recent EGD/Colon  TTE 01/18/22 progressive AS in setting bicuspid valve EF 55-60% mean gradient 40 peak 63 mmHg  Post op TTE done 06/27/22 reviewed EF 55-60% no AR mean gradient 9 peak 16 mmHg AVA 1.7 cm2 DVI 0.48  Cath 01/31/22 Normal right dominant cors  Dr [name] AVR 05/21/22 21 mm Edwards Inspiris Resilia pericardial valve with 26 mm Hemashield graft and 24 mm interposition graft Some orthostasis post op d/c with midodrine   She is still working as a Marine scientist on [employer]  Has a 20 and 52 yo at home  Finished cardiac rehab well with no issues   She is having daily palpitations Concerned about PAF. 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   Sinusitis    Thyroid nodule    UTI (urinary tract infection)     Past Surgical History:  Procedure Laterality Date   AORTIC VALVE REPLACEMENT N/A 05/21/2022   Procedure: AORTIC VALVE REPLACEMENT (AVR) USING A 21MM INSPIRIS TISSUE VALVE;   Surgeon: [name], MD;  Location: [facility];  Service: Open Heart Surgery;  Laterality: N/A;   CESAREAN SECTION     REPLACEMENT ASCENDING AORTA N/A 05/21/2022   Procedure: REPLACEMENT ASCENDING AORTA USING A 26 X 10MM HEMASHIELD AND AND A 24MM X 30CM HEMASHEILD GRAFT;  Surgeon: [name], MD;  Location: [facility];  Service: Open Heart Surgery;  Laterality: N/A;   RIGHT HEART CATH AND CORONARY ANGIOGRAPHY N/A 01/31/2022   Procedure: RIGHT HEART CATH AND CORONARY ANGIOGRAPHY;  Surgeon: Early Osmond, MD;  Location: [facility];  Service: Cardiovascular;  Laterality: N/A;   TEE WITHOUT CARDIOVERSION N/A 05/21/2022   Procedure: TRANSESOPHAGEAL ECHOCARDIOGRAM (TEE);  Surgeon: [name], MD;  Location: [facility];  Service: Open Heart Surgery;  Laterality: N/A;   TUBAL LIGATION  2007   VARICOSE VEIN SURGERY Right 2015     Current Outpatient Medications  Medication Sig Dispense Refill   acetaminophen (TYLENOL) 500 MG tablet Take 1,000 mg by mouth every 6 (six) hours as needed for headache or moderate pain.     aspirin EC 325 MG tablet Take 1 tablet (325 mg total) by mouth daily.  docusate sodium (COLACE) 100 MG capsule Take 200 mg by mouth daily.     fluticasone (FLONASE) 50 MCG/ACT nasal spray Place 2 sprays into affected nostril(s) daily 16 g 11   loratadine (CLARITIN) 10 MG tablet Take 10 mg by mouth daily as needed for allergies.     No current facility-administered medications for this visit.    Allergies:   Doxycycline, Ivp dye [iodinated contrast media], and Other    Social History:  The patient  reports that she has never smoked. She has never used smokeless tobacco. She reports that she does not drink alcohol and does not use drugs.   Family History:  The patient's family history includes Anemia in her sister; Asthma in her sister; Breast cancer in her sister; Emphysema in her mother; Hyperlipidemia in her brother; Hypertension in her father and mother; Thyroid  disease in her paternal aunt, sister, sister, and sister.    ROS:  General:no colds or fevers, no weight changes Skin:no rashes or ulcers HEENT:no blurred vision, no congestion CV:see HPI PUL:see HPI GI:no diarrhea constipation or melena, no indigestion GU:no hematuria, no dysuria MS:no joint pain, no claudication Neuro:no syncope, no lightheadedness Endo:no diabetes, no thyroid disease  Wt Readings from Last 3 Encounters:  11/05/22 149 lb 6.4 oz (67.8 kg)  08/30/22 147 lb 14.9 oz (67.1 kg)  07/24/22 145 lb (65.8 kg)     PHYSICAL EXAM: VS:  BP 110/62   Pulse 85   Ht 5\' 2"  (1.575 m)   Wt 149 lb 6.4 oz (67.8 kg)   SpO2 100%   BMI 27.33 kg/m  , BMI Body mass index is 27.33 kg/m.  Affect appropriate Healthy:  appears stated age 76: normal Neck supple with no adenopathy JVP normal no bruits no thyromegaly Lungs clear with no wheezing and good diaphragmatic motion Heart:  S1/S2 AS  murmur, no rub, gallop or click PMI normal Abdomen: benighn, BS positve, no tenderness, no AAA no bruit.  No HSM or HJR Distal pulses intact with no bruits No edema Neuro non-focal Skin warm and dry No muscular weakness     EKG:   SR no changes from previous.    Recent Labs: 12/24/2021: TSH 0.97 05/17/2022: ALT 27 05/22/2022: Magnesium 2.8 06/12/2022: BUN 12; Creatinine, Ser 0.56; Hemoglobin 11.7; Platelets 418; Potassium 4.2; Sodium 140    Lipid Panel    Component Value Date/Time   CHOL 170 12/24/2021 1055   TRIG 52.0 12/24/2021 1055   HDL 75.10 12/24/2021 1055   CHOLHDL 2 12/24/2021 1055   VLDL 10.4 12/24/2021 1055   LDLCALC 85 12/24/2021 1055       Other studies Reviewed:  Echo 06/05/18  Echo 04/20/22  Cardiac CTA 07/06/18   ASSESSMENT AND PLAN:  1.  Bicuspid Ao Valve : post AVR and root replacement 21 mm Inspiris pericardial valve 05/21/22  post op TTE normal EF and valve with no AR/AS She is having some discomfort in chest and unusual feelings in chest with loud  noises in ears. Favor gated chest CTA to make sure root anastomosis intact with no hematoma TTE was fine in regard to valve She has contrast listed as allergy just palpitations will give 13 hour prep and lopressor 25 mg 2 hours before study   2.  Palpitations  R/O post op PAF 14 day zio  3. 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  Leg Pain:  Likely some venous engorgement on right with previous varicosities pulses good and ABI's normal 07/12/19 F  5. Thyroid :  Nodules on [place] 04/12/22 Biopsy 06/25/22 f/u with primary   6. Orthostasis: resolved off midodrine at this time  Gated chest CTA 14 day monitor  Pre meds for CTA see above   F/U Dr [name] CVTS F/U in 6 months    Signed, [name], MD  11/05/2022 2:44 PM    [facility], [place], [place], [place] Phone: [phone]; Fax: [phone]

## 2022-11-05 ENCOUNTER — Encounter: Payer: Commercial Managed Care - PPO | Attending: Cardiovascular Disease | Admitting: Cardiovascular Disease

## 2022-11-05 ENCOUNTER — Encounter: Payer: Self-pay | Admitting: Family

## 2022-11-05 ENCOUNTER — Encounter: Payer: Self-pay | Admitting: Cardiovascular Disease

## 2022-11-05 ENCOUNTER — Other Ambulatory Visit (HOSPITAL_BASED_OUTPATIENT_CLINIC_OR_DEPARTMENT_OTHER): Payer: Self-pay

## 2022-11-05 ENCOUNTER — Ambulatory Visit: Payer: Commercial Managed Care - PPO | Attending: Cardiovascular Disease

## 2022-11-05 VITALS — BP 110/62 | HR 85 | Ht 62.0 in | Wt 149.4 lb

## 2022-11-05 DIAGNOSIS — Q231 Congenital insufficiency of aortic valve: Secondary | ICD-10-CM

## 2022-11-05 DIAGNOSIS — R002 Palpitations: Secondary | ICD-10-CM | POA: Insufficient documentation

## 2022-11-05 DIAGNOSIS — Z952 Presence of prosthetic heart valve: Secondary | ICD-10-CM | POA: Insufficient documentation

## 2022-11-05 MED ORDER — DIPHENHYDRAMINE HCL 25 MG PO TABS
50.0000 mg | ORAL_TABLET | Freq: Every day | ORAL | 0 refills | Status: DC
  Filled 2022-11-05: qty 24, 12d supply, fill #0

## 2022-11-05 MED ORDER — PREDNISONE 50 MG PO TABS
ORAL_TABLET | ORAL | 0 refills | Status: DC
  Filled 2022-11-05: qty 3, 1d supply, fill #0

## 2022-11-05 MED ORDER — METOPROLOL TARTRATE 25 MG PO TABS
25.0000 mg | ORAL_TABLET | Freq: Once | ORAL | 0 refills | Status: DC
  Filled 2022-11-05: qty 1, 1d supply, fill #0

## 2022-11-05 MED ORDER — AMOXICILLIN 500 MG PO CAPS
2000.0000 mg | ORAL_CAPSULE | Freq: Every day | ORAL | 1 refills | Status: DC
  Filled 2022-11-05: qty 12, 3d supply, fill #0
  Filled 2023-04-07: qty 12, 3d supply, fill #1

## 2022-11-05 NOTE — Progress Notes (Unsigned)
Enrolled for [employer] to mail a ZIO XT long term holter monitor to the patients address on file.  

## 2022-11-05 NOTE — Patient Instructions (Addendum)
Medication Instructions:  Your physician has recommended you make the following change in your medication:  1-TAKE amoxicillin 2000 mg (4 tablets)  by mouth 1 hour prior to dental procedures.  *If you need a refill on your cardiac medications before your next appointment, please call your pharmacy*  Lab Work: Your physician recommends that you have lab work today- BMET  If you have labs (blood work) drawn today and your tests are completely normal, you will receive your results only by: MyChart Message (if you have MyChart) OR A paper copy in the mail If you have any lab test that is abnormal or we need to change your treatment, we will call you to review the results.   Testing/Procedures: Gated chest CTA scanning, (CAT scanning), is a noninvasive, special x-ray that produces cross-sectional images of the body using x-rays and a computer. CT scans help physicians diagnose and treat medical conditions. For some CT exams, a contrast material is used to enhance visibility in the area of the body being studied. CT scans provide greater clarity and reveal more details than regular x-ray exams.  Your physician has recommended that you wear a 14 day event monitor. Event monitors are medical devices that record the heart's electrical activity. Doctors most often [place] these monitors to diagnose arrhythmias. Arrhythmias are problems with the speed or rhythm of the heartbeat. The monitor is a small, portable device. You can wear one while you do your normal daily activities. This is usually used to diagnose what is causing palpitations/syncope (passing out).  Follow-Up: At [facility], you and your health needs are our priority.  As part of our continuing mission to provide you with exceptional heart care, we have created designated Provider Care Teams.  These Care Teams include your primary Cardiologist (physician) and Advanced Practice Providers (APPs -  Physician Assistants and Nurse  Practitioners) who all work together to provide you with the care you need, when you need it.  We recommend signing up for the patient portal called "MyChart".  Sign up information is provided on this After Visit Summary.  MyChart is used to connect with patients for Virtual Visits (Telemedicine).  Patients are able to view lab/test results, encounter notes, upcoming appointments, etc.  Non-urgent messages can be sent to your provider as well.   To learn more about what you can do with MyChart, go to [email or web address].    Your next appointment:   6 month(s)  Provider:   [name], MD     Other Instructions   Your cardiac CT will be scheduled at one of the below locations:   [facility] [address] [place], [place] [address] [phone]  If scheduled at [facility], please arrive at the [facility] and Children's Entrance (Entrance C2) of [facility] 30 minutes prior to test start time. You can use the FREE valet parking offered at entrance C (encouraged to control the heart rate for the test)  Proceed to the [facility] Radiology Department (first floor) to check-in and test prep.  All radiology patients and guests should use entrance C2 at [facility], accessed from [facility], even though the hospital's physical address listed is [address].    If scheduled at [facility] or [facility] please arrive 15 mins early for check-in and test prep.   Please follow these instructions carefully (unless otherwise directed):  Hold all erectile dysfunction medications at least 3 days (72  hrs) prior to test. (Ie viagra, cialis, sildenafil, tadalafil, etc) We will administer nitroglycerin during this exam.   On the Night Before the Test: Be sure to Drink plenty of water. Do not consume any caffeinated/decaffeinated beverages or chocolate 12 hours prior to your test. Do not  take any antihistamines 12 hours prior to your test. If the patient has contrast allergy: Patient will need a prescription for Prednisone and very clear instructions (as follows): Prednisone 50 mg - take 13 hours prior to test Take another Prednisone 50 mg 7 hours prior to test Take another Prednisone 50 mg 1 hour prior to test Take Benadryl 50 mg 1 hour prior to test Patient must complete all four doses of above prophylactic medications. Patient will need a ride after test due to Benadryl.  On the Day of the Test: Drink plenty of water until 1 hour prior to the test. Do not eat any food 1 hour prior to test. You may take your regular medications prior to the test.  Take metoprolol (Lopressor) 25 mg two hours prior to test. FEMALES- please wear underwire-free bra if available, avoid dresses & tight clothing      After the Test: Drink plenty of water. After receiving IV contrast, you may experience a mild flushed feeling. This is normal. On occasion, you may experience a mild rash up to 24 hours after the test. This is not dangerous. If this occurs, you can take Benadryl 25 mg and increase your fluid intake. If you experience trouble breathing, this can be serious. If it is severe call 911 IMMEDIATELY. If it is mild, please call our office.  We will call to schedule your test 2-4 weeks out understanding that some insurance companies will need an authorization prior to the service being performed.   For non-scheduling related questions, please contact the cardiac imaging nurse navigator should you have any questions/concerns: Marchia Bond, Cardiac Imaging Nurse Navigator Gordy Clement, Cardiac Imaging Nurse Navigator [facility] Direct Office Dial: [phone]   For scheduling needs, including cancellations and rescheduling, please call [place], [phone].

## 2022-11-06 LAB — BASIC METABOLIC PANEL
BUN/Creatinine Ratio: 23 (ref 9–23)
BUN: 17 mg/dL (ref 6–24)
CO2: 24 mmol/L (ref 20–29)
Calcium: 9.6 mg/dL (ref 8.7–10.2)
Chloride: 104 mmol/L (ref 96–106)
Creatinine, Ser: 0.75 mg/dL (ref 0.57–1.00)
Glucose: 97 mg/dL (ref 70–99)
Potassium: 4.6 mmol/L (ref 3.5–5.2)
Sodium: 143 mmol/L (ref 134–144)
eGFR: 96 mL/min/{1.73_m2} (ref >59)

## 2022-11-11 DIAGNOSIS — Q231 Congenital insufficiency of aortic valve: Secondary | ICD-10-CM | POA: Diagnosis not present

## 2022-11-11 DIAGNOSIS — R002 Palpitations: Secondary | ICD-10-CM | POA: Diagnosis not present

## 2022-11-11 DIAGNOSIS — Z952 Presence of prosthetic heart valve: Secondary | ICD-10-CM

## 2022-11-26 ENCOUNTER — Ambulatory Visit (HOSPITAL_COMMUNITY)
Admission: RE | Admit: 2022-11-26 | Discharge: 2022-11-26 | Disposition: A | Discharge status: Home / Self Care | Payer: Commercial Managed Care - PPO | Source: Ambulatory Visit | Attending: Cardiovascular Disease | Admitting: Cardiovascular Disease

## 2022-11-26 DIAGNOSIS — Z952 Presence of prosthetic heart valve: Secondary | ICD-10-CM | POA: Insufficient documentation

## 2022-11-26 DIAGNOSIS — Q231 Congenital insufficiency of aortic valve: Secondary | ICD-10-CM | POA: Diagnosis not present

## 2022-11-26 DIAGNOSIS — R002 Palpitations: Secondary | ICD-10-CM | POA: Insufficient documentation

## 2022-11-26 DIAGNOSIS — R918 Other nonspecific abnormal finding of lung field: Secondary | ICD-10-CM | POA: Diagnosis not present

## 2022-11-26 MED ORDER — IOHEXOL 350 MG/ML SOLN
75.0000 mL | Freq: Once | INTRAVENOUS | Status: AC | PRN
  Administered 2022-11-26: 75 mL via INTRAVENOUS

## 2022-11-27 DIAGNOSIS — Q231 Congenital insufficiency of aortic valve: Secondary | ICD-10-CM | POA: Diagnosis not present

## 2022-11-27 DIAGNOSIS — R002 Palpitations: Secondary | ICD-10-CM | POA: Diagnosis not present

## 2022-12-02 ENCOUNTER — Telehealth: Payer: Self-pay | Admitting: Cardiovascular Disease

## 2022-12-02 DIAGNOSIS — J479 Bronchiectasis, uncomplicated: Secondary | ICD-10-CM

## 2022-12-02 NOTE — Telephone Encounter (Signed)
The patient has been notified of the result and verbalized understanding.  All questions (if any) were answered. Cindi Carbon [place], RN 12/02/2022 1:33 PM   Patient states she has not seen a pulmonologist. Placed referral for patient to see pulmonology.

## 2022-12-02 NOTE — Telephone Encounter (Signed)
-----   Message from Wendall Stade, MD sent at 12/01/2022 12:04 PM EDT ----- Aortic repair looks good Bronchiectasis in lungs If she has not seen pulmonary needs to be referred

## 2022-12-02 NOTE — Telephone Encounter (Signed)
Patient is returning call in regards to results. Requesting return call.  

## 2022-12-24 ENCOUNTER — Other Ambulatory Visit (HOSPITAL_BASED_OUTPATIENT_CLINIC_OR_DEPARTMENT_OTHER): Payer: Self-pay

## 2022-12-24 ENCOUNTER — Encounter: Payer: Self-pay | Admitting: Internal Medicine

## 2022-12-24 ENCOUNTER — Ambulatory Visit: Payer: Commercial Managed Care - PPO | Admitting: Internal Medicine

## 2022-12-24 VITALS — BP 114/68 | HR 96 | Temp 98.4°F | Ht 62.0 in | Wt 151.6 lb

## 2022-12-24 DIAGNOSIS — R0982 Postnasal drip: Secondary | ICD-10-CM

## 2022-12-24 DIAGNOSIS — J479 Bronchiectasis, uncomplicated: Secondary | ICD-10-CM | POA: Diagnosis not present

## 2022-12-24 MED ORDER — AZELASTINE-FLUTICASONE 137-50 MCG/ACT NA SUSP
1.0000 | Freq: Every day | NASAL | 5 refills | Status: DC
  Filled 2022-12-24: qty 23, 30d supply, fill #0
  Filled 2023-04-07: qty 23, 30d supply, fill #1
  Filled 2023-07-01: qty 23, 30d supply, fill #2
  Filled 2023-07-29: qty 23, 30d supply, fill #3
  Filled 2023-08-28: qty 23, 30d supply, fill #4
  Filled 2023-10-27: qty 23, 30d supply, fill #5

## 2022-12-24 NOTE — Patient Instructions (Signed)
Please come back and see me as needed.  You have simple focal bronchiectasis probably secondary to a previous infection or pneumonia. It is essentially scarred off and not active so I don't believe any further follow up is needed for this.   I think your cough is secondary to post nasal drainage. You can stop the claritin and switch to cetirizine or levocetirizine (zyrtec or xyzal.)  I will change your flonase spray to dymista.  dymista - 1 spray on each side of your nose twice a day for first week, then 1 spray on each side.   Instructions for use: If you also use a saline nasal spray or rinse, use that first. Position the head with the chin slightly tucked. Use the right hand to spray into the left nostril and the right hand to spray into the left nostril.   Point the bottle away from the septum of your nose (cartilage that divides the two sides of your nose).  Hold the nostril closed on the opposite side from where you will spray Spray once and gently sniff to pull the medicine into the higher parts of your nose.  Don't sniff too hard as the medicine will drain down the back of your throat instead. Repeat with a second spray on the same side if prescribed. Repeat on the other side of your nose.

## 2022-12-24 NOTE — Progress Notes (Signed)
Amy Terry    [id number]    1971/02/10  Primary Care Physician:Copland, Gwenlyn Found, MD  Referring Physician: Wendall Stade, MD [phone] [address] [place],  [place] [address] Reason for Consultation: bronchiectasis Date of Consultation: 12/24/2022  Chief complaint:   Chief Complaint  Patient presents with   Follow-up    Pt states she had a CT performed which showed bronchiectasis. Pt has had an occasional cough which she states is worse throughout the day.     HPI: Amy Terry is a 52 y.o. woman who presents for new patient evaluation bronchiectasis. She has a history of bicuspid aortic valve s/p AVR in October 2023. CT scan showed bronchiectasis she is referred for further care.   She does note some palpitations that have persistent before and after her AVR - symptoms last for a few minutes and stop spontaneously. She is a Museum/gallery exhibitions officer at [employer] She had a zio holter monitor.   She has some shortness of breath. It has improved overall since her surgery. She attributes this to weight gain. She has gained about ten lbs after the surgery. She has occasional cough, non productive.   She had pneumonia 2-3 times, last time was 3 years ago. Never hospitalized for this. No childhood respiratory disease.  She has chronic sinusitis. She takes claritin and flonase. She feels more clogged in her right nose than her left. Ongoing post nasal drip and sore throat.     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  Never had tuberculosis or any known close contacts.   Social history:  Occupation: works as a Engineer, civil (consulting).  Exposures: lives at home with husband and kids, no pets.  Smoking history: never smoker, no passive smoke exposure.   Social History   Occupational History   Not on file  Tobacco Use   Smoking status: Never   Smokeless tobacco: Never  Vaping Use   Vaping Use: Never used  Substance and Sexual Activity   Alcohol use: No     Alcohol/week: 0.0 standard drinks of alcohol   Drug use: No   Sexual activity: Yes    Partners: Male    Relevant family history:  Family History  Problem Relation Age of Onset   Hypertension Mother    Emphysema Mother    Hypertension Father    Thyroid disease Sister    Breast cancer Sister    Thyroid disease Sister    Asthma Sister    Anemia Sister    Thyroid disease Sister    Hyperlipidemia Brother    Thyroid disease Paternal Aunt    Asthma Daughter     Past Medical History:  Diagnosis Date   Allergy    Anemia    Chicken pox    Complication of anesthesia    hypotensive post-op   Congenital nasal septum deviation    COVID 06/2021   Headache    Heart murmur    History of tonsillitis    Hypoglycemic disorder    Hx passing out with hypoglycemic   Pneumonia    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   Sinusitis    Thyroid nodule    UTI (urinary tract infection)     Past Surgical History:  Procedure Laterality Date   AORTIC VALVE REPLACEMENT N/A 05/21/2022   Procedure: AORTIC VALVE REPLACEMENT (AVR) USING A INSPIRIS TISSUE VALVE;  Surgeon: Alleen Borne, MD;  [facility];  Service: Open Heart Surgery;  Laterality: N/A;   CESAREAN SECTION     REPLACEMENT ASCENDING AORTA N/A 05/21/2022   Procedure: REPLACEMENT ASCENDING AORTA USING A 26 X HEMASHIELD AND AND A X 30CM HEMASHEILD GRAFT;  Surgeon: Alleen Borne, MD;  Location: [facility];  Service: Open Heart Surgery;  Laterality: N/A;   RIGHT HEART CATH AND CORONARY ANGIOGRAPHY N/A 01/31/2022   Procedure: RIGHT HEART CATH AND CORONARY ANGIOGRAPHY;  Surgeon: Orbie Pyo, MD;  Location: [facility];  Service: Cardiovascular;  Laterality: N/A;   TEE WITHOUT CARDIOVERSION N/A 05/21/2022   Procedure: TRANSESOPHAGEAL ECHOCARDIOGRAM (TEE);  Surgeon: Alleen Borne, MD;  [facility];  Service: Open Heart Surgery;  Laterality: N/A;   TUBAL LIGATION  2007   VARICOSE VEIN SURGERY Right 2015      Physical Exam: Blood pressure 114/68, pulse 96, temperature 98.4 F (36.9 C), temperature source Oral, height 5\' 2"  (1.575 m), weight 151 lb 9.6 oz (68.8 kg), SpO2 100 %. Gen:      No acute distress, occasional dry cough ENT:  septal deviation to the right, no nasal polyps, mucus membranes moist, mild cobblestoning Lungs:    No increased respiratory effort, symmetric chest wall excursion, clear to auscultation bilaterally, no wheezes or crackles CV:         Regular rate and rhythm; systolic murmur, No pedal edema Abd:      + bowel sounds; soft, non-tender; no distension MSK: no acute synovitis of DIP or PIP joints, no mechanics hands.  Skin:      Warm and dry; no rashes Neuro: normal speech, no focal facial asymmetry Psych: alert and oriented x3, normal mood and affect   Data Reviewed/Medical Decision Making:  Independent interpretation of tests: Imaging:  Review of patient's CT Angio chest/aorta April 2024 images revealed focal lingular bronchiectasis. This was demonstrated and is unchanged form June 2023. The patient's images have been independently reviewed by me.    PFTs:  Labs:  Lab Results  Component Value Date   NA 143 11/05/2022   K 4.6 11/05/2022   CO2 24 11/05/2022   GLUCOSE 97 11/05/2022   BUN 17 11/05/2022   CREATININE 0.75 11/05/2022   CALCIUM 9.6 11/05/2022   EGFR 96 11/05/2022   GFRNONAA >60 05/26/2022   Lab Results  Component Value Date   WBC 7.6 06/12/2022   HGB 11.7 06/12/2022   HCT 37.9 06/12/2022   MCV 90 06/12/2022   PLT 418 06/12/2022     Immunization status:  Immunization History  Administered Date(s) Administered   Influenza-Unspecified 05/02/2014, 05/10/2022   Moderna SARS-COV2 Booster Vaccination 09/15/2020   PFIZER Comirnaty(Gray Top)Covid-19 Tri-Sucrose Vaccine 08/19/2019, 09/06/2019   Td 12/24/2021   Tdap 08/12/2005   Zoster Recombinat (Shingrix) 12/24/2021, 06/27/2022     I reviewed prior external note(s) from cardiology,  surgery  I reviewed the result(s) of the labs and imaging as noted above.   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  S/p AVR in October 2023 for bicuspid aortic valve  Plan/Recommendations: You have simple focal bronchiectasis probably secondary to a previous infection or pneumonia. It is essentially scarred off and not active so I don't believe any further follow up is needed for this. Given return to care instructions for respiratory symptoms, fevers, "not feeling well."  I think your cough is secondary to post nasal drainage. You can  stop the claritin and switch to cetirizine or levocetirizine (zyrtec or xyzal.)  I will change your flonase spray to dymista.  We discussed disease management and progression at length today.    Return to Care: Return if symptoms worsen or fail to improve.  Durel Salts, MD Pulmonary and Critical Care Medicine [facility] [phone]  CC: Wendall Stade, MD

## 2023-01-16 ENCOUNTER — Ambulatory Visit: Payer: Self-pay | Admitting: Cardiovascular Disease

## 2023-03-27 ENCOUNTER — Ambulatory Visit: Payer: Commercial Managed Care - PPO | Admitting: Cardiovascular Disease

## 2023-04-03 ENCOUNTER — Encounter: Payer: Self-pay | Admitting: Cardiovascular Disease

## 2023-04-03 ENCOUNTER — Ambulatory Visit: Payer: Commercial Managed Care - PPO | Attending: Cardiovascular Disease | Admitting: Cardiovascular Disease

## 2023-04-03 VITALS — BP 110/68 | HR 88 | Ht 62.0 in | Wt 155.6 lb

## 2023-04-03 DIAGNOSIS — Z952 Presence of prosthetic heart valve: Secondary | ICD-10-CM

## 2023-04-03 NOTE — Progress Notes (Signed)
Cardiology Office Note   Date:  04/03/2023   ID:  Amy Terry, DOB [birth year], MRN [id number]   PCP:  Pearline Cables, MD  Cardiologist:  Dr Eden Emms    No chief complaint on file.     History of Present Illness: Amy Terry is a 52 y.o. female F/U post AVR      07/06/18 Cardiac CTA: Aortic root 3.6 cm Calcium score 0 normal right dominant coronary arteries    Long history of iron deficiency anemia with ferritin only 2 Has not been responsive to oral iron Seen by hematology 12/31/21 set up for iv iron Hb 8.6 and has not had recent EGD/Colon  TTE 01/18/22 progressive AS in setting bicuspid valve EF 55-60% mean gradient 40 peak 63 mmHg  Post op TTE done 06/27/22 reviewed EF 55-60% no AR mean gradient 9 peak 16 mmHg AVA 1.7 cm2 DVI 0.48  Cath 01/31/22 Normal right dominant cors  Dr Laneta Simmers AVR 05/21/22 21 mm Edwards Inspiris Resilia pericardial valve with 26 mm Hemashield graft and 24 mm interposition graft Some orthostasis post op d/c with midodrine   She is still working as a Engineer, civil (consulting) on [employer]  Has a 16 and 52 yo at home  Finished cardiac rehab well with no issues   She is having daily palpitations Concerned about PAF. 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   Sinusitis    Thyroid nodule    UTI (urinary tract infection)     Past Surgical History:  Procedure Laterality Date   AORTIC VALVE REPLACEMENT N/A 05/21/2022   Procedure: AORTIC VALVE  REPLACEMENT (AVR) USING A INSPIRIS TISSUE VALVE;  Surgeon: Alleen Borne, MD;  Location: [facility] OR;  Service: Open Heart Surgery;  Laterality: N/A;   CESAREAN SECTION     REPLACEMENT ASCENDING AORTA N/A 05/21/2022   Procedure: REPLACEMENT ASCENDING AORTA USING A 26 X HEMASHIELD AND AND A X 30CM HEMASHEILD GRAFT;  Surgeon: Alleen Borne, MD;  Location: [facility] OR;  Service: Open Heart Surgery;  Laterality: N/A;   RIGHT HEART CATH AND CORONARY ANGIOGRAPHY N/A 01/31/2022   Procedure: RIGHT HEART CATH AND CORONARY ANGIOGRAPHY;  Surgeon: Orbie Pyo, MD;  Location: [facility];  Service: Cardiovascular;  Laterality: N/A;   TEE WITHOUT CARDIOVERSION N/A 05/21/2022   Procedure: TRANSESOPHAGEAL ECHOCARDIOGRAM (TEE);  Surgeon: Alleen Borne, MD;  Location: [facility] OR;  Service: Open Heart Surgery;  Laterality: N/A;   TUBAL LIGATION  2007   VARICOSE VEIN SURGERY Right 2015     Current Outpatient Medications  Medication Sig Dispense Refill   acetaminophen (TYLENOL) 500 MG tablet Take 1,000 mg by mouth every 6 (six) hours as needed for headache or moderate pain.     amoxicillin (AMOXIL) 500 MG capsule  Take 4 capsules (2,000 mg total) by mouth 1 hour prior to dental procedures. 12 capsule 1   aspirin EC 325 MG tablet Take 1 tablet (325 mg total) by mouth daily.     Azelastine-Fluticasone 137-50 MCG/ACT SUSP Place 1 spray into the nose daily. 23 g 5   docusate sodium (COLACE) 100 MG capsule Take 200 mg by mouth daily.     loratadine (CLARITIN) 10 MG tablet Take 10 mg by mouth daily as needed for allergies.     No current facility-administered medications for this visit.    Allergies:   Doxycycline, Ivp dye [iodinated contrast media], and Other    Social History:  The patient  reports that she has never smoked. She has never used smokeless tobacco. She reports that she does not drink alcohol and does not use drugs.   Family History:  The patient's family history includes Anemia in  her sister; Asthma in her daughter and sister; Breast cancer in her sister; Emphysema in her mother; Hyperlipidemia in her brother; Hypertension in her father and mother; Thyroid disease in her paternal aunt, sister, sister, and sister.    ROS:  General:no colds or fevers, no weight changes Skin:no rashes or ulcers HEENT:no blurred vision, no congestion CV:see HPI PUL:see HPI GI:no diarrhea constipation or melena, no indigestion GU:no hematuria, no dysuria MS:no joint pain, no claudication Neuro:no syncope, no lightheadedness Endo:no diabetes, no thyroid disease  Wt Readings from Last 3 Encounters:  04/03/23 155 lb 9.6 oz (70.6 kg)  12/24/22 151 lb 9.6 oz (68.8 kg)  11/05/22 149 lb 6.4 oz (67.8 kg)     PHYSICAL EXAM: VS:  BP 110/68   Pulse 88   Ht 5\' 2"  (1.575 m)   Wt 155 lb 9.6 oz (70.6 kg)   SpO2 98%   BMI 28.46 kg/m  , BMI Body mass index is 28.46 kg/m.  Affect appropriate Healthy:  appears stated age HEENT: normal Neck supple with no adenopathy JVP normal no bruits no thyromegaly Lungs clear with no wheezing and good diaphragmatic motion Heart:  S1/S2 AS  murmur, no rub, gallop or click PMI normal Abdomen: benighn, BS positve, no tenderness, no AAA no bruit.  No HSM or HJR Distal pulses intact with no bruits No edema Neuro non-focal Skin warm and dry No muscular weakness     EKG:   SR no changes from previous.    Recent Labs: 05/17/2022: ALT 27 05/22/2022: Magnesium 2.8 06/12/2022: Hemoglobin 11.7; Platelets 418 11/05/2022: BUN 17; Creatinine, Ser 0.75; Potassium 4.6; Sodium 143    Lipid Panel    Component Value Date/Time   CHOL 170 12/24/2021 1055   TRIG 52.0 12/24/2021 1055   HDL 75.10 12/24/2021 1055   CHOLHDL 2 12/24/2021 1055   VLDL 10.4 12/24/2021 1055   LDLCALC 85 12/24/2021 1055       Other studies Reviewed:  Echo 06/05/18  Echo 04/20/22  Cardiac CTA 07/06/18   ASSESSMENT AND PLAN:  1.  Bicuspid Ao Valve : post AVR and root  replacement 21 mm Inspiris pericardial valve 05/21/22  post op TTE normal EF and valve with no AR/AS She is having some discomfort in chest and unusual feelings in chest with loud noises in ears. CTA chest 11/30/22 with normal surgical anatomy and no anastomotic issues   2.  Palpitations  12/01/22 monitor benign short bursts self limited SVT no PaF  3. 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 Leg Pain:  Likely some venous engorgement on right with previous varicosities pulses good and ABI's normal 07/12/19 F  5. Thyroid :  Nodules on [place] 04/12/22 Biopsy 06/25/22 f/u with primary   6. Orthostasis: resolved off midodrine at this time  F/U in 6 months    Signed, Charlton Haws, MD  04/03/2023 10:28 AM    [facility] [address] [place], [place], [place]  [address]/ [address] [place], [place] Phone: [phone]; Fax: [phone]  [phone]

## 2023-04-03 NOTE — Patient Instructions (Addendum)
Medication Instructions:  Your physician recommends that you continue on your current medications as directed. Please refer to the Current Medication list given to you today.  *If you need a refill on your cardiac medications before your next appointment, please call your pharmacy*  Lab Work: If you have labs (blood work) drawn today and your tests are completely normal, you will receive your results only by: [facility] (if you have MyChart) OR A paper copy in the mail If you have any lab test that is abnormal or we need to change your treatment, we will call you to review the results.  Testing/Procedures: None ordered today.  Follow-Up: At [facility], you and your health needs are our priority.  As part of our continuing mission to provide you with exceptional heart care, we have created designated Provider Care Teams.  These Care Teams include your primary Cardiologist (physician) and Advanced Practice Providers (APPs -  Physician Assistants and Nurse Practitioners) who all work together to provide you with the care you need, when you need it.  We recommend signing up for the patient portal called "MyChart".  Sign up information is provided on this After Visit Summary.  MyChart is used to connect with patients for Virtual Visits (Telemedicine).  Patients are able to view lab/test results, encounter notes, upcoming appointments, etc.  Non-urgent messages can be sent to your provider as well.   To learn more about what you can do with MyChart, go to [email or web address].    Your next appointment:   12 month(s)  Provider:   [name], MD

## 2023-04-06 NOTE — Patient Instructions (Incomplete)
It was good to see you again today, I am sorry you have been through so much recently!   Recommend flu shot, COVID booster this fall

## 2023-04-06 NOTE — Progress Notes (Unsigned)
[facility] [address], [address] [place], [place] [address] [phone]  Date:  04/07/2023   Name:  Amy Terry   DOB:  [birth year]   MRN:  [id number]  PCP:  Pearline Cables, MD    Chief Complaint: No chief complaint on file.   History of Present Illness:  Amy Terry is a 52 y.o. 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  We ended up having her see cardiology for a bicuspid aortic valve-symptomatic with exertional dyspnea and chest pain.  She was eventually diagnosed with severe aortic stenosis and underwent a heart cath on 01/31/2022 She was then admitted in October and Dr. Laneta Simmers did an aortic valve replacement She went through cardiac rehab, most recent visit with Dr. Eden Emms on was just last week: ASSESSMENT AND PLAN: 1.  Bicuspid Ao Valve : post AVR and root replacement 21 mm Inspiris pericardial valve 05/21/22  post op TTE normal EF and valve with no AR/AS She is having some discomfort in chest and unusual feelings in chest with loud noises in ears. CTA chest 11/30/22 with normal surgical anatomy and no anastomotic issues  2.  Palpitations  12/01/22 monitor benign short bursts self limited SVT no PaF 3. 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 Leg Pain:  Likely some venous engorgement on right with previous varicosities pulses good and ABI's normal 07/12/19 F 5. Thyroid :  Nodules on [place] 04/12/22 Biopsy 06/25/22 f/u with primary  6. Orthostasis: resolved off midodrine at this time  She  also saw pulmonology in May for bronchiectasis that was seen on a previous CT scan-they did not think there is anything further to do at this time  Mammogram can be updated Pap smear up-to-date, completed last May Colon cancer screening is up-to-date Shingrix is complete Patient Active Problem List   Diagnosis Date Noted   S/P aortic valve replacement and supra coronary replacement of ascending aorta 05/21/2022   Aortic valve stenosis, severe 04/10/2022   Heart murmur 04/10/2022   Iron deficiency anemia 07/26/2014   Palpitations 07/21/2014   Allergic rhinitis 07/21/2014   Abdominal pain 06/08/2014   Acute sinusitis 06/08/2014   Cold intolerance 06/08/2014   Fatigue 06/08/2014   Low back pain 06/08/2014   Viral gastroenteritis 06/08/2014    Past Medical History:  Diagnosis Date   Allergy    Anemia    Chicken pox    Complication of anesthesia    hypotensive post-op   Congenital nasal septum deviation    COVID 06/2021   Headache    Heart murmur    History of tonsillitis    Hypoglycemic disorder    Hx passing out with hypoglycemic   Pneumonia    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   Sinusitis    Thyroid nodule    UTI (urinary tract infection)     Past Surgical History:  Procedure Laterality Date   AORTIC VALVE REPLACEMENT N/A 05/21/2022   Procedure: AORTIC VALVE REPLACEMENT (AVR) USING  A INSPIRIS TISSUE VALVE;  Surgeon: Alleen Borne, MD;  Location: [facility] OR;  Service: Open Heart Surgery;  Laterality: N/A;   CESAREAN SECTION     REPLACEMENT ASCENDING AORTA N/A 05/21/2022   Procedure: REPLACEMENT ASCENDING AORTA USING A 26 X HEMASHIELD AND AND A X 30CM HEMASHEILD GRAFT;  Surgeon: Alleen Borne, MD;  Location: [facility] OR;  Service: Open Heart Surgery;  Laterality: N/A;   RIGHT HEART CATH AND CORONARY ANGIOGRAPHY N/A 01/31/2022   Procedure: RIGHT HEART CATH AND CORONARY ANGIOGRAPHY;  Surgeon: Orbie Pyo, MD;  Location: [facility];  Service:  Cardiovascular;  Laterality: N/A;   TEE WITHOUT CARDIOVERSION N/A 05/21/2022   Procedure: TRANSESOPHAGEAL ECHOCARDIOGRAM (TEE);  Surgeon: Alleen Borne, MD;  Location: [facility] OR;  Service: Open Heart Surgery;  Laterality: N/A;   TUBAL LIGATION  2007   VARICOSE VEIN SURGERY Right 2015    Social History   Tobacco Use   Smoking status: Never   Smokeless tobacco: Never  Vaping Use   Vaping status: Never Used  Substance Use Topics   Alcohol use: No    Alcohol/week: 0.0 standard drinks of alcohol   Drug use: No    Family History  Problem Relation Age of Onset   Hypertension Mother    Emphysema Mother    Hypertension Father    Thyroid disease Sister    Breast cancer Sister    Thyroid disease Sister    Asthma Sister    Anemia Sister    Thyroid disease Sister    Hyperlipidemia Brother    Thyroid disease Paternal Aunt    Asthma Daughter     Allergies  Allergen Reactions   Doxycycline Other (See Comments)    Causes severe heartburn   Ivp Dye [Iodinated Contrast Media] Palpitations   Other Rash    Tegaderm    Medication list has been reviewed and updated.  Current Outpatient Medications on File Prior to Visit  Medication Sig Dispense Refill   acetaminophen (TYLENOL) 500 MG tablet Take 1,000 mg by mouth every 6 (six) hours as needed for headache or moderate pain.     amoxicillin (AMOXIL) 500 MG capsule Take 4 capsules (2,000 mg total) by mouth 1 hour prior to dental procedures. 12 capsule 1   aspirin EC 325 MG tablet Take 1 tablet (325 mg total) by mouth daily.     Azelastine-Fluticasone 137-50 MCG/ACT SUSP Place 1 spray into the nose daily. 23 g 5   docusate sodium (COLACE) 100 MG capsule Take 200 mg by mouth daily.     loratadine (CLARITIN) 10 MG tablet Take 10 mg by mouth daily as needed for allergies.     No current facility-administered medications on file prior to visit.    Review of Systems:  As per HPI- otherwise negative.   Physical Examination: There were  no vitals filed for this visit. There were no vitals filed for this visit. There is no height or weight on file to calculate BMI. Ideal Body Weight:    GEN: no acute distress. HEENT: Atraumatic, Normocephalic.  Ears and Nose: No external deformity. CV: RRR, No M/G/R. No JVD. No thrill. No extra heart sounds. PULM: CTA B, no wheezes, crackles, rhonchi. No retractions. No resp. distress. No accessory muscle use. ABD: S, NT, ND, +BS. No rebound. No HSM. EXTR: No c/c/e PSYCH: Normally interactive. Conversant. '  Assessment and Plan: *** Patient seen today for physical exam.  She has had a challenging year, as  above she underwent an aortic valve replacement last fall  Recommend healthy diet and exercise routine Recommend COVID booster and flu shot this fall  Signed Abbe Amsterdam, MD

## 2023-04-07 ENCOUNTER — Encounter (HOSPITAL_BASED_OUTPATIENT_CLINIC_OR_DEPARTMENT_OTHER): Payer: Self-pay

## 2023-04-07 ENCOUNTER — Ambulatory Visit (INDEPENDENT_AMBULATORY_CARE_PROVIDER_SITE_OTHER): Payer: Commercial Managed Care - PPO | Admitting: Family Medicine

## 2023-04-07 ENCOUNTER — Ambulatory Visit (HOSPITAL_BASED_OUTPATIENT_CLINIC_OR_DEPARTMENT_OTHER)
Admission: RE | Admit: 2023-04-07 | Discharge: 2023-04-07 | Disposition: A | Discharge status: Home / Self Care | Payer: Commercial Managed Care - PPO | Source: Ambulatory Visit | Attending: Family Medicine | Admitting: Family Medicine

## 2023-04-07 VITALS — BP 122/62 | HR 73 | Temp 97.9°F | Resp 18 | Ht 62.0 in | Wt 155.2 lb

## 2023-04-07 DIAGNOSIS — Z0000 Encounter for general adult medical examination without abnormal findings: Secondary | ICD-10-CM | POA: Diagnosis not present

## 2023-04-07 DIAGNOSIS — Z1231 Encounter for screening mammogram for malignant neoplasm of breast: Secondary | ICD-10-CM | POA: Diagnosis not present

## 2023-04-07 DIAGNOSIS — Z13220 Encounter for screening for lipoid disorders: Secondary | ICD-10-CM

## 2023-04-07 DIAGNOSIS — Z131 Encounter for screening for diabetes mellitus: Secondary | ICD-10-CM

## 2023-04-07 DIAGNOSIS — I350 Nonrheumatic aortic (valve) stenosis: Secondary | ICD-10-CM | POA: Diagnosis not present

## 2023-04-07 DIAGNOSIS — D509 Iron deficiency anemia, unspecified: Secondary | ICD-10-CM

## 2023-04-07 DIAGNOSIS — Z1329 Encounter for screening for other suspected endocrine disorder: Secondary | ICD-10-CM | POA: Diagnosis not present

## 2023-04-08 ENCOUNTER — Encounter: Payer: Self-pay | Admitting: Family Medicine

## 2023-04-08 LAB — LIPID PANEL
Cholesterol: 193 mg/dL (ref 0–200)
HDL: 62.2 mg/dL (ref >39.00)
LDL Cholesterol: 112 mg/dL — ABNORMAL HIGH (ref 0–99)
NonHDL: 130.98
Total CHOL/HDL Ratio: 3
Triglycerides: 93 mg/dL (ref 0.0–149.0)
VLDL: 18.6 mg/dL (ref 0.0–40.0)

## 2023-04-08 LAB — COMPREHENSIVE METABOLIC PANEL
ALT: 14 U/L (ref 0–35)
AST: 17 U/L (ref 0–37)
Albumin: 3.9 g/dL (ref 3.5–5.2)
Alkaline Phosphatase: 57 U/L (ref 39–117)
BUN: 15 mg/dL (ref 6–23)
CO2: 29 meq/L (ref 19–32)
Calcium: 9.2 mg/dL (ref 8.4–10.5)
Chloride: 102 mEq/L (ref 96–112)
Creatinine, Ser: 0.69 mg/dL (ref 0.40–1.20)
GFR: 99.75 mL/min (ref >60.00)
Glucose, Bld: 93 mg/dL (ref 70–99)
Potassium: 4.4 mEq/L (ref 3.5–5.1)
Sodium: 139 mEq/L (ref 135–145)
Total Bilirubin: 0.4 mg/dL (ref 0.2–1.2)
Total Protein: 6.9 g/dL (ref 6.0–8.3)

## 2023-04-08 LAB — CBC
HCT: 44.8 % (ref 36.0–46.0)
Hemoglobin: 14.2 g/dL (ref 12.0–15.0)
MCHC: 31.7 g/dL (ref 30.0–36.0)
MCV: 91 fl (ref 78.0–100.0)
Platelets: 234 10*3/uL (ref 150.0–400.0)
RBC: 4.92 Mil/uL (ref 3.87–5.11)
RDW: 13.8 % (ref 11.5–15.5)
WBC: 6.3 10*3/uL (ref 4.0–10.5)

## 2023-04-08 LAB — TSH: TSH: 0.8 u[IU]/mL (ref 0.35–5.50)

## 2023-04-08 LAB — HEMOGLOBIN A1C: Hgb A1c MFr Bld: 5.4 % (ref 4.6–6.5)

## 2023-05-23 ENCOUNTER — Ambulatory Visit: Payer: Commercial Managed Care - PPO | Admitting: Nurse Practitioner

## 2023-12-10 ENCOUNTER — Telehealth: Payer: Self-pay | Admitting: Cardiovascular Disease

## 2023-12-10 ENCOUNTER — Encounter (HOSPITAL_BASED_OUTPATIENT_CLINIC_OR_DEPARTMENT_OTHER): Payer: Self-pay

## 2023-12-10 NOTE — Telephone Encounter (Signed)
 Patient c/o Palpitations: STAT if patient c/o lightheadedness, shortness of breath, or chest pain  How long have you had palpitations/irregular HR/ Afib? Are you having the symptoms now? A week  Are you currently experiencing lightheadedness, SOB or CP? Yes SOB and lightheaded   Do you have a history of afib (atrial fibrillation) or irregular heart rhythm? No  Have you checked your BP or HR? (document readings if available): Yes  Are you experiencing any other symptoms? No

## 2023-12-10 NOTE — Telephone Encounter (Signed)
 Spoke with pt who reports increasing palpitations and DOE over the past several weeks.  Pt denies current CP or edema.  BP 112/80, HR in the 80's, O2 sat 98%.  She also reports some occasional dizziness but denies fainting.  Reviewed ED precautions and appointment scheduled for 12/11/2023 with Palmer Bobo, NP.  Pt verbalizes understanding and agrees with current plan.

## 2023-12-11 ENCOUNTER — Other Ambulatory Visit (HOSPITAL_BASED_OUTPATIENT_CLINIC_OR_DEPARTMENT_OTHER): Payer: Self-pay

## 2023-12-11 ENCOUNTER — Encounter: Payer: Self-pay | Admitting: Emergency Medicine

## 2023-12-11 ENCOUNTER — Ambulatory Visit: Attending: Emergency Medicine | Admitting: Emergency Medicine

## 2023-12-11 ENCOUNTER — Ambulatory Visit

## 2023-12-11 VITALS — BP 106/72 | HR 71 | Ht 62.0 in | Wt 162.2 lb

## 2023-12-11 DIAGNOSIS — R002 Palpitations: Secondary | ICD-10-CM | POA: Diagnosis not present

## 2023-12-11 DIAGNOSIS — I350 Nonrheumatic aortic (valve) stenosis: Secondary | ICD-10-CM

## 2023-12-11 DIAGNOSIS — Z952 Presence of prosthetic heart valve: Secondary | ICD-10-CM | POA: Diagnosis not present

## 2023-12-11 DIAGNOSIS — R011 Cardiac murmur, unspecified: Secondary | ICD-10-CM

## 2023-12-11 DIAGNOSIS — R0609 Other forms of dyspnea: Secondary | ICD-10-CM | POA: Diagnosis not present

## 2023-12-11 LAB — CBC

## 2023-12-11 MED ORDER — AMOXICILLIN 500 MG PO CAPS
2000.0000 mg | ORAL_CAPSULE | Freq: Every day | ORAL | 1 refills | Status: DC
  Filled 2023-12-11: qty 12, 3d supply, fill #0

## 2023-12-11 NOTE — Progress Notes (Signed)
 Cardiology Office Note:    Date:  12/11/2023  ID:  Amy Terry, DOB Dec 06, 1970, MRN [id number] PCP: Amy Partridge, MD  [facility] Providers Cardiologist:  Amy Mediate, MD       Patient Profile:      Chief Complaint: Acute visit palpitations History of Present Illness:  Amy Terry is a 53 y.o. female with visit-pertinent history of palpitations, bicuspid aortic valve, aortic stenosis  She establish care with Dr. Stann Terry in 2016 for palpitations and dizziness.  She was found to have an bicuspid aortic valve on echocardiogram.  Event monitor was also worn that showed no arrhythmias and predominant sinus rhythm.  She later presented in 2019 with similar complaints and 2D echo was repeated and showed similar findings.  Cardiac CTA was also obtained and showed aortic root of 3.6 cm and coronary calcium  score of 0.  She returned on 01/25/2022.  She has long history of iron  deficiency anemia with ferritin of only 2.  She has not been responsive to oral iron .  Seen by hematology on 12/31/2021 and set up for iron  infusion.  She had repeat echocardiogram which revealed LVEF 55 to 60%, bicuspid aortic valve with severe aortic stenosis, mean gradient 40 peak 63 mmHg. Dr. Nishan discussed the need for catheterization and referral to CVTS for symptomatic severe AS.  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  She underwent right and left heart catheterization on 01/31/22 which showed normal coronaries.  She underwent aortic valve replacement on 05/21/2022 with 21 mm Edwards Inspiris Resilia pericardial valve with 26 mm Hemashield graft and 24 mm interposition graft.  She did have some orthostasis postop and discharged on midodrine .  ZIO monitor 11/05/2022 showed minimum heart rate 57 bpm, max heart rate 152 bpm, average heart 84 bpm.  Predominant underlying rhythm was sinus rhythm.  2  supraventricular tachycardia runs occurred, the run with the fastest interval lasting 8 beats with a max rate of 152 bpm, the run with the fastest interval was also the longest.  Last seen in clinic on 04/03/2023.  She was noted to have daily palpitations.  No medication changes or orders were placed.  She was to follow-up in 6 months.   Discussed the use of AI scribe software for clinical note transcription with the patient, who gave verbal consent to proceed.  History of Present Illness Amy Terry is a 53 year old female with a history of palpitations who presents with increased frequency of palpitations and near-syncope episodes. She has been following with Dr. Nishan for her condition.  She experiences palpitations described as a 'kick' or 'flick,' occurring several times daily and lasting a few seconds. These intensify with sleep deprivation but occur even with adequate rest. No specific triggers such as caffeine or dehydration are identified. She has a history of isolated PVCs and two episodes of SVT identified on a previous heart monitor. She is not on medications for palpitations as she has typically low normal blood pressure readings.  Episodes of near-syncope have increased in frequency over recent months. She feels woozy when standing for a few minutes or after sudden movements.  She also notes that she has had episodes of near syncope at the same time as her palpitations.  No associated chest pain.  She has noticed edema in her arms for the past few months and reports a weight gain of approximately 7 pounds since August  2024. No shortness of breath or chest pain when lying flat.  She hears her pulse loudly when lying on her side, affecting her sleep. She works night shifts and often sleeps only 3-4 hours, waking up tired. No symptoms of sleep apnea, but she feels fatigued when sleep-deprived.  Review of systems:  Please see the history of present illness. All other systems are  reviewed and otherwise negative.     Home Medications:    Current Meds  Medication Sig   acetaminophen  (TYLENOL ) 500 MG tablet Take 1,000 mg by mouth every 6 (six) hours as needed for headache or moderate pain.   aspirin  EC 325 MG tablet Take 1 tablet (325 mg total) by mouth daily.   Azelastine -Fluticasone  137-50 MCG/ACT SUSP Place 1 spray into the nose daily.   docusate sodium  (COLACE) 100 MG capsule Take 200 mg by mouth daily.   loratadine (CLARITIN) 10 MG tablet Take 10 mg by mouth daily as needed for allergies.   [DISCONTINUED] amoxicillin  (AMOXIL ) 500 MG capsule Take 4 capsules (2,000 mg total) by mouth 1 hour prior to dental procedures.   Studies Reviewed:       ZIO 11/05/2022 Patch Wear Time:  13 days and 22 hours (2024-04-01T15:08:23-0400 to 2024-04-15T14:03:01-399)   Patient had a min HR of 57 bpm, max HR of 152 bpm, and avg HR of 84 bpm. Predominant underlying rhythm was Sinus Rhythm. 2 Supraventricular Tachycardia runs occurred, the run with the fastest interval lasting 8 beats with a max rate of 152 bpm (avg 140  bpm); the run with the fastest interval was also the longest. Supraventricular Tachycardia was detected within +/- 45 seconds of symptomatic patient event(s). Isolated SVEs were rare (<1.0%), and no SVE Couplets or SVE Triplets were present. Isolated VEs  were occasional (1.2%, 19351), and no VE Couplets or VE Triplets were present. Ventricular Bigeminy and Trigeminy were present.   Echocardiogram 06/27/2022 1. Left ventricular ejection fraction, by estimation, is 55 to 60%. The  left ventricle has normal function. The left ventricle has no regional  wall motion abnormalities. Left ventricular diastolic parameters were  normal.   2. Right ventricular systolic function is mildly reduced. The right  ventricular size is normal. There is normal pulmonary artery systolic  pressure. The estimated right ventricular systolic pressure is 22.0 mmHg.   3. The mitral valve is  normal in structure. Trivial mitral valve  regurgitation. No evidence of mitral stenosis.   4. The aortic valve has been repaired/replaced. Aortic valve  regurgitation is not visualized. There is a 21 mm Edwards INSPIRIS RESILIA  pericardial valve present in the aortic position. Procedure Date:  05/21/22. Echo findings are consistent with normal  structure and function of the aortic valve prosthesis.   5. Supra-coronary replacement of the ascending aorta (hemi-arch) using a  26 mm Hemashield graft and 24 mm interposition graft. Aortic  root/ascending aorta has been repaired/replaced with grossly normal  post-operative appearance.   6. The inferior vena cava is normal in size with greater than 50%  respiratory variability, suggesting right atrial pressure of 3 mmHg.  Risk Assessment/Calculations:             Physical Exam:   VS:  BP 106/72 (BP Location: Left Arm, Patient Position: Sitting, Cuff Size: Normal)   Pulse 71   Ht 5\' 2"  (1.575 m)   Wt 162 lb 3.2 oz (73.6 kg)   SpO2 98%   BMI 29.67 kg/m    Wt Readings from Last 3 Encounters:  12/11/23 162 lb 3.2 oz (73.6 kg)  04/07/23 155 lb 3.2 oz (70.4 kg)  04/03/23 155 lb 9.6 oz (70.6 kg)    GEN: Well nourished, well developed in no acute distress NECK: No JVD; No carotid bruits CARDIAC: RRR, no murmurs, rubs, gallops RESPIRATORY:  Clear to auscultation without rales, wheezing or rhonchi  ABDOMEN: Soft, non-tender, non-distended EXTREMITIES:  No edema; No acute deformity     Assessment and Plan:  Palpitations ZIO 11/2022 2 SVT runs occurred, the run with the fastest interval lasting 8 beats with max rate of 152 bpm, isolated VE's were occasional at 1.2% She notes ongoing palpitations for several years.  However she notes over the past several months her palpitations have became more frequent and she is now experiencing presyncope.  Palpitations are in very short duration and described as a "kick" or "flick" without high frequency  that occurs several times daily - EKG today showed NSR without ischemic changes or arrhythmia - No chest pains or shortness of breath and no anginal symptoms - Will order 7-day ZIO monitor for arrhythmias - Will repeat echocardiogram today to monitor for LV dysfunction or valvular abnormalities - CBC, BMET, TSH, MAG ordered today - Has not started on beta-blocker therapy in the past given low normal blood pressure.  Blood pressure today is 106/72.  Pending results of ZIO monitor could trial metoprolol  XL  Bicuspid aortic valve / Severe aortic stenosis S/p aortic valve replacement in 2023 Underwent AVR with 21 mm Inspiris bioprosthetic valve in the ascending aorta was replaced with 24 mm and 26 mm Hemashield grafts.   - Remains asymptomatic without chest pains or shortness of breath.  However she does continue to have palpitations - Most recent echocardiogram 06/2022 showed of AVR consistent with normal structure and function - She has noted some trace edema to bilateral arms and legs and 7 pound weight gain since last OV - Repeat echocardiogram as above - SBE prophylaxis, amoxicillin  refilled today      Dispo:  Return in about 3 months (around 03/12/2024).  Signed, Ava Boatman, NP

## 2023-12-11 NOTE — Patient Instructions (Addendum)
 Medication Instructions:  NO CHANGES   Lab Work: CBC, BMET, TSH, MAGNESIUM  TO BE DONE TODAY.   Testing/Procedures: Your physician has requested that you have an ECHOCARDIOGRAM. Echocardiography is a painless test that uses sound waves to create images of your heart. It provides your doctor with information about the size and shape of your heart and how well your heart's chambers and valves are working. This procedure takes approximately one hour. There are no restrictions for this procedure. Please do NOT wear cologne, perfume, aftershave, or lotions (deodorant is allowed). Please arrive 15 minutes prior to your appointment time.  Please note: We ask at that you not bring children with you during ultrasound (echo/ vascular) testing. Due to room size and safety concerns, children are not allowed in the ultrasound rooms during exams. Our front office staff cannot provide observation of children in our lobby area while testing is being conducted. An adult accompanying a patient to their appointment will only be allowed in the ultrasound room at the discretion of the ultrasound technician under special circumstances. We apologize for any inconvenience.  ZIO XT- Long Term Monitor Instructions  Your physician has requested you wear a ZIO patch monitor for 7 days.  This is a single patch monitor. [employer] supplies one patch monitor per enrollment. Additional stickers are not available. Please do not apply patch if you will be having a Nuclear Stress Test,  Echocardiogram, Cardiac CT, MRI, or Chest Xray during the period you would be wearing the  monitor. The patch cannot be worn during these tests. You cannot remove and re-apply the  ZIO XT patch monitor.  Your ZIO patch monitor will be mailed 3 day USPS to your address on file. It may take 3-5 days  to receive your monitor after you have been enrolled.  Once you have received your monitor, please review the enclosed instructions. Your monitor  has  already been registered assigning a specific monitor serial # to you.  Billing and Patient Assistance Program Information  We have supplied [employer] with any of your insurance information on file for billing purposes. [employer] offers a sliding scale Patient Assistance Program for patients that do not have  insurance, or whose insurance does not completely cover the cost of the ZIO monitor.  You must apply for the Patient Assistance Program to qualify for this discounted rate.  To apply, please call [employer] at [phone], select option 4, select option 2, ask to apply for  Patient Assistance Program. Sanna Natthew Marlatt will ask your household income, and how many people  are in your household. They will quote your out-of-pocket cost based on that information.  [employer] will also be able to set up a 41-month, interest-free payment plan if needed.  Applying the monitor   Shave hair from upper left chest.  Hold abrader disc by orange tab. Rub abrader in 40 strokes over the upper left chest as  indicated in your monitor instructions.  Clean area with 4 enclosed alcohol pads. Let dry.  Apply patch as indicated in monitor instructions. Patch will be placed under collarbone on left  side of chest with arrow pointing upward.  Rub patch adhesive wings for 2 minutes. Remove white label marked "1". Remove the white  label marked "2". Rub patch adhesive wings for 2 additional minutes.  While looking in a mirror, press and release button in center of patch. A small green light will  flash 3-4 times. This will be your only indicator that the monitor has been turned on.  Do not shower for the first 24 hours. You may shower after the first 24 hours.  Press the button if you feel a symptom. You will hear a small click. Record Date, Time and  Symptom in the Patient Logbook.  When you are ready to remove the patch, follow instructions on the last 2 pages of Patient  Logbook. Stick patch monitor onto the last page of  Patient Logbook.  Place Patient Logbook in the blue and white box. Use locking tab on box and tape box closed  securely. The blue and white box has prepaid postage on it. Please place it in the mailbox as  soon as possible. Your physician should have your test results approximately 7 days after the  monitor has been mailed back to [facility].  Call [facility] Customer Care at [phone] if you have questions regarding  your ZIO XT patch monitor. Call them immediately if you see an orange light blinking on your  monitor.  If your monitor falls off in less than 4 days, contact our Monitor department at [phone].  If your monitor becomes loose or falls off after 4 days call [employer] at [phone] for  suggestions on securing your monitor   Follow-Up: At [facility], you and your health needs are our priority.  As part of our continuing mission to provide you with exceptional heart care, our providers are all part of one team.  This team includes your primary Cardiologist (physician) and Advanced Practice Providers or APPs (Physician Assistants and Nurse Practitioners) who all work together to provide you with the care you need, when you need it.  Your next appointment:   KEEP AUGUST APPOINTMENT WITH DR. Stann Earnest.  Provider:   Janelle Mediate, MD

## 2023-12-11 NOTE — Progress Notes (Unsigned)
Enrolled patient for a 7 day Zio XT monitor to be mailed to patients home  Nishan to read

## 2023-12-12 LAB — CBC
Hematocrit: 43.6 % (ref 34.0–46.6)
Hemoglobin: 14.4 g/dL (ref 11.1–15.9)
MCH: 29.7 pg (ref 26.6–33.0)
MCHC: 33 g/dL (ref 31.5–35.7)
MCV: 90 fL (ref 79–97)
Platelets: 290 10*3/uL (ref 150–450)
RBC: 4.85 x10E6/uL (ref 3.77–5.28)
RDW: 13 % (ref 11.7–15.4)
WBC: 5.4 10*3/uL (ref 3.4–10.8)

## 2023-12-12 LAB — TSH: TSH: 2.57 u[IU]/mL (ref 0.450–4.500)

## 2023-12-12 LAB — MAGNESIUM: Magnesium: 2 mg/dL (ref 1.6–2.3)

## 2023-12-12 LAB — BASIC METABOLIC PANEL WITH GFR
BUN/Creatinine Ratio: 21 (ref 9–23)
BUN: 14 mg/dL (ref 6–24)
CO2: 26 mmol/L (ref 20–29)
Calcium: 9.4 mg/dL (ref 8.7–10.2)
Chloride: 101 mmol/L (ref 96–106)
Creatinine, Ser: 0.66 mg/dL (ref 0.57–1.00)
Glucose: 87 mg/dL (ref 70–99)
Potassium: 4.3 mmol/L (ref 3.5–5.2)
Sodium: 140 mmol/L (ref 134–144)
eGFR: 105 mL/min/{1.73_m2} (ref >59)

## 2023-12-15 ENCOUNTER — Other Ambulatory Visit: Payer: Self-pay | Admitting: Internal Medicine

## 2023-12-16 ENCOUNTER — Other Ambulatory Visit (HOSPITAL_BASED_OUTPATIENT_CLINIC_OR_DEPARTMENT_OTHER): Payer: Self-pay

## 2023-12-26 DIAGNOSIS — R002 Palpitations: Secondary | ICD-10-CM | POA: Diagnosis not present

## 2023-12-26 DIAGNOSIS — Z952 Presence of prosthetic heart valve: Secondary | ICD-10-CM | POA: Diagnosis not present

## 2023-12-29 DIAGNOSIS — R002 Palpitations: Secondary | ICD-10-CM | POA: Diagnosis not present

## 2023-12-29 DIAGNOSIS — R011 Cardiac murmur, unspecified: Secondary | ICD-10-CM

## 2023-12-29 DIAGNOSIS — Z952 Presence of prosthetic heart valve: Secondary | ICD-10-CM | POA: Diagnosis not present

## 2023-12-31 ENCOUNTER — Ambulatory Visit: Payer: Self-pay | Admitting: Emergency Medicine

## 2024-01-15 ENCOUNTER — Ambulatory Visit (HOSPITAL_COMMUNITY)
Admission: RE | Admit: 2024-01-15 | Discharge: 2024-01-15 | Disposition: A | Discharge status: Home / Self Care | Source: Ambulatory Visit | Attending: Internal Medicine | Admitting: Internal Medicine

## 2024-01-15 DIAGNOSIS — R002 Palpitations: Secondary | ICD-10-CM | POA: Diagnosis not present

## 2024-01-15 DIAGNOSIS — Z952 Presence of prosthetic heart valve: Secondary | ICD-10-CM | POA: Diagnosis not present

## 2024-01-15 LAB — ECHOCARDIOGRAM COMPLETE
AR max vel: 0.98 cm2
AV Area VTI: 0.99 cm2
AV Area mean vel: 0.97 cm2
AV Mean grad: 19 mmHg
AV Peak grad: 34.5 mmHg
Ao pk vel: 2.94 m/s
Area-P 1/2: 3.21 cm2
S' Lateral: 2.4 cm

## 2024-03-29 NOTE — Progress Notes (Signed)
 Cardiology Office Note   Date:  04/05/2024   ID:  Amy Terry, DOB 04-30-1971, MRN [id number]   PCP:  Watt Harlene BROCKS, MD  Cardiologist:  Dr Delford    No chief complaint on file.     History of Present Illness: Amy Terry is a 53 y.o. female F/U post AVR      07/06/18 Cardiac CTA: Aortic root 3.6 cm Calcium  score 0 normal right dominant coronary arteries    Long history of iron  deficiency anemia with ferritin only 2 Has not been responsive to oral iron  Seen by hematology 12/31/21 set up for iv iron  Hb 8.6 and has not had recent EGD/Colon  TTE 01/18/22 progressive AS in setting bicuspid valve EF 55-60% mean gradient 40 peak 63 mmHg  Post op TTE done 06/27/22 reviewed EF 55-60% no AR mean gradient 9 peak 16 mmHg AVA 1.7 cm2 DVI 0.48  Cath 01/31/22 Normal right dominant cors  Dr Lucas AVR 05/21/22 21 mm Edwards Inspiris Resilia pericardial valve with 26 mm Hemashield graft and 24 mm interposition graft Some orthostasis post op d/c with midodrine    She is still working as a Engineer, civil (consulting) on 52M  Has a 17 and 53 yo at home    She is having daily palpitations Concerned about PAF. She is bothered by loud noise of valve Makes it hard for her to take BP's at work She has funny sensation of pain in throat and into neck No radiation to back She sees pulmonary for lingular bronchiectasis with cough  Monitor reviewed from 12/2023. Average HR 75 bpm. 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    Sinusitis    Thyroid  nodule    UTI (urinary tract infection)     Past Surgical History:  Procedure Laterality Date   AORTIC VALVE REPLACEMENT N/A 05/21/2022   Procedure: AORTIC VALVE REPLACEMENT (AVR) USING A INSPIRIS TISSUE VALVE;  Surgeon: Lucas Dorise POUR, MD;  Location: [facility] OR;  Service: Open Heart Surgery;  Laterality: N/A;   CESAREAN SECTION     REPLACEMENT ASCENDING AORTA N/A 05/21/2022   Procedure: REPLACEMENT ASCENDING AORTA USING A 26 X HEMASHIELD AND AND A X 30CM HEMASHEILD GRAFT;  Surgeon: Lucas Dorise POUR, MD;  Location: [facility] OR;  Service: Open Heart Surgery;  Laterality: N/A;   RIGHT HEART CATH AND CORONARY ANGIOGRAPHY N/A 01/31/2022   Procedure: RIGHT HEART CATH AND CORONARY ANGIOGRAPHY;  Surgeon: Wendel Lurena POUR, MD;  Location: [facility] INVASIVE CV LAB;  Service: Cardiovascular;  Laterality: N/A;   TEE WITHOUT CARDIOVERSION N/A 05/21/2022   Procedure: TRANSESOPHAGEAL ECHOCARDIOGRAM (TEE);  Surgeon: Lucas Dorise POUR, MD;  [facility] OR;  Service: Open Heart Surgery;  Laterality: N/A;   TUBAL LIGATION  2007   VARICOSE VEIN SURGERY Right 2015     Current Outpatient Medications  Medication Sig Dispense  Refill   acetaminophen  (TYLENOL ) 500 MG tablet Take 1,000 mg by mouth every 6 (six) hours as needed for headache or moderate pain.     amoxicillin  (AMOXIL ) 500 MG capsule Take 4 capsules (2,000 mg total) by mouth 1 hour prior to dental procedures. 12 capsule 1   aspirin  EC 325 MG tablet Take 1 tablet (325 mg total) by mouth daily.     Azelastine -Fluticasone  137-50 MCG/ACT SUSP Place 1 spray into the nose daily. 23 g 5   loratadine (CLARITIN) 10 MG tablet Take 10 mg by mouth daily as needed for allergies.     No current facility-administered medications for this visit.    Allergies:   Doxycycline , Ivp dye [iodinated contrast media], and Other    Social History:  The patient  reports that she has never smoked. She has never used smokeless tobacco. She reports that she  does not drink alcohol and does not use drugs.   Family History:  The patient's family history includes Anemia in her sister; Asthma in her daughter and sister; Breast cancer in her sister; Emphysema in her mother; Hyperlipidemia in her brother; Hypertension in her father and mother; Thyroid  disease in her paternal aunt, sister, sister, and sister.    ROS:  General:no colds or fevers, no weight changes Skin:no rashes or ulcers HEENT:no blurred vision, no congestion CV:see HPI PUL:see HPI GI:no diarrhea constipation or melena, no indigestion GU:no hematuria, no dysuria MS:no joint pain, no claudication Neuro:no syncope, no lightheadedness Endo:no diabetes, no thyroid  disease  Wt Readings from Last 3 Encounters:  04/05/24 167 lb 9.6 oz (76 kg)  12/11/23 162 lb 3.2 oz (73.6 kg)  04/07/23 155 lb 3.2 oz (70.4 kg)     PHYSICAL EXAM: VS:  BP 98/60 (BP Location: Left Arm, Patient Position: Sitting, Cuff Size: Normal)   Pulse 64   Ht 5' 2 (1.575 m)   Wt 167 lb 9.6 oz (76 kg)   SpO2 97%   BMI 30.65 kg/m  , BMI Body mass index is 30.65 kg/m.  Affect appropriate Healthy:  appears stated age HEENT: normal Neck supple with no adenopathy JVP normal no bruits no thyromegaly Lungs clear with no wheezing and good diaphragmatic motion Heart:  S1/S2 SEM through AVR no AR   murmur, no rub, gallop or click PMI normal prior sternotomy Abdomen: benighn, BS positve, no tenderness, no AAA no bruit.  No HSM or HJR Distal pulses intact with no bruits No edema Neuro non-focal Skin warm and dry No muscular weakness   EKG:   SR no changes from previous.    Recent Labs: 04/07/2023: ALT 14 12/11/2023: BUN 14; Creatinine, Ser 0.66; Hemoglobin 14.4; Magnesium  2.0; Platelets 290; Potassium 4.3; Sodium 140; TSH 2.570    Lipid Panel    Component Value Date/Time   CHOL 193 04/07/2023 1351   TRIG 93.0 04/07/2023 1351   HDL 62.20 04/07/2023 1351   CHOLHDL 3 04/07/2023 1351   VLDL 18.6 04/07/2023  1351   LDLCALC 112 (H) 04/07/2023 1351       Other studies Reviewed:  Echo 01/15/24:  IMPRESSIONS     1. Left ventricular ejection fraction, by estimation, is 60 to 65%. The  left ventricle has normal function. The left ventricle has no regional  wall motion abnormalities. Left ventricular diastolic parameters were  normal.   2. Right ventricular systolic function is normal. The right ventricular  size is normal. There is normal pulmonary artery systolic pressure.   3. The mitral valve is normal  in structure. Trivial mitral valve  regurgitation. No evidence of mitral stenosis.   4. Aortic valve mean gradient 19 mmHg increased from 16 mmHg 06/2022. The  aortic valve has been repaired/replaced. Aortic valve regurgitation is  mild. No aortic stenosis is present. There is a 21 mm Eswards Inspiris  Resilia pericardial valve present in  the aortic position. Aortic valve area, by VTI measures 0.99 cm. Aortic  valve mean gradient measures 19.0 mmHg. Aortic valve Vmax measures 2.94  m/s.   5. Supra-coronary replacement of the ascending aorta (hemi-arch) with 26  mm Hemashield graft. Aortic root/ascending aorta has been  repaired/replaced.   6. The inferior vena cava is normal in size with greater than 50%  respiratory variability, suggesting right atrial pressure of 3 mmHg   Monitor:  12/2023:  Patch Wear Time:  7 days and 3 hours (2025-05-05T11:34:12-398 to 2025-05-12T14:47:21-0400)   Patient had a min HR of 53 bpm, max HR of 119 bpm, and avg HR of 75 bpm. Predominant underlying rhythm was Sinus Rhythm. Isolated SVEs were rare (<1.0%), and no SVE Couplets or SVE Triplets were present. Isolated VEs were rare (<1.0%), and no VE Couplets  or VE Triplets were present. Ventricular Trigeminy was present.    Maude Emmer MD [facility]  ASSESSMENT AND PLAN:  1.  Bicuspid Ao Valve : post AVR and root replacement 21 mm Inspiris pericardial valve 05/21/22  post op TTE 01/15/24 with some increase in  gradients and mild AR. Update TTE June 2026 if gradients continue to increase or AR worse consider cardiac CTA to r/o HALT/HAM CTA chest 11/30/22 with normal surgical anatomy and no anastomotic issues   2.  Palpitations  12/29/23  monitor benign no PaF PAC/PVC;s < 1%   3. 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 Leg Pain:  Likely some venous engorgement on right with previous varicosities pulses good and ABI's normal 07/12/19   5. Thyroid  :  Nodules on US  04/12/22 Biopsy 06/25/22 f/u with primary   6. Orthostasis: resolved off midodrine  at this time  TTE June 2026  F/U in a year   Signed, Maude Emmer, MD  04/05/2024 9:03 AM    [facility] [address] [place], [place], [place]  [address]/ [address] [place], [place] Phone: [phone]; Fax: [phone]  [phone]

## 2024-04-04 NOTE — Patient Instructions (Incomplete)
 It was great to see you today, I will be in touch with your labs.  Please be sure to get a flu shot and COVID booster this fall  Let's plan for a pap and recheck thyroid  ultrasound next year I will refer you to dermatology for your keloid scar- they may have some treatment to offer you Let me look at HRT for hot flashes for you in a bit more detail

## 2024-04-04 NOTE — Progress Notes (Unsigned)
 [facility] [address], [address] [place], [place] [address] [phone]  Date:  04/07/2024   Name:  Amy Terry   DOB:  02-Jan-1971   MRN:  [id number]  PCP:  Amy Harlene BROCKS, MD    Chief Complaint: No chief complaint on file.   History of Present Illness:  Amy Terry is a 53 y.o. very pleasant female patient who presents with the following:  Patient is in today for physical exam.  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  Recently she has been dealing with some palpitations and found to have SVT on Zio patch She also has history of severe iron  deficiency anemia.  Most recent CBC on chart from May of this year shows normal  Recommend flu shot, COVID booster this fall Can obtain hepatitis B immunity screening Colon cancer screening-Cologuard completed 2023 Pap smear 2023 Mammo can be updated  Per most recent cardiology note, May: Palpitations ZIO 11/2022 2 SVT runs occurred, the run with the fastest interval lasting 8 beats with max rate of 152 bpm, isolated VE's were occasional at 1.2% She notes ongoing palpitations for several years.  However she notes over the past several months her palpitations have became more frequent and she is now experiencing presyncope.  Palpitations are in very short duration and described as a kick or flick without high frequency that occurs several times daily - EKG today showed NSR without ischemic changes or arrhythmia - No chest pains or shortness of breath and no anginal symptoms - Will order 7-day ZIO monitor for arrhythmias - Will repeat echocardiogram today to monitor for LV dysfunction or valvular abnormalities - CBC, BMET, TSH, MAG ordered today - Has not started on beta-blocker therapy in the past given low normal blood  pressure.  Blood pressure today is 106/72.  Pending results of ZIO monitor could trial metoprolol  XL Bicuspid aortic valve / Severe aortic stenosis S/p aortic valve replacement in 2023 Underwent AVR with 21 mm Inspiris bioprosthetic valve in the ascending aorta was replaced with 24 mm and 26 mm Hemashield grafts.   - Remains asymptomatic without chest pains or shortness of breath.  However she does continue to have palpitations - Most recent echocardiogram 06/2022 showed of AVR consistent with normal structure and function - She has noted some trace edema to bilateral arms and legs and 7 pound weight gain since last OV - Repeat echocardiogram as above - SBE prophylaxis, amoxicillin  refilled today   Amoxicillin  prior to dental procedure Aspirin  325    Patient Active Problem List   Diagnosis Date Noted   S/P aortic valve replacement and supra coronary replacement of ascending aorta 05/21/2022   Aortic valve stenosis, severe 04/10/2022   Heart murmur 04/10/2022   Iron  deficiency anemia 07/26/2014   Palpitations 07/21/2014   Allergic rhinitis 07/21/2014   Abdominal pain 06/08/2014   Acute sinusitis 06/08/2014   Cold intolerance 06/08/2014   Fatigue 06/08/2014   Low back pain 06/08/2014   Viral gastroenteritis 06/08/2014    Past Medical History:  Diagnosis Date   Allergy    Anemia    Chicken pox    Complication of anesthesia    hypotensive post-op   Congenital nasal septum deviation    COVID 06/2021   Headache    Heart murmur  History of tonsillitis    Hypoglycemic disorder    Hx passing out with hypoglycemic   Pneumonia    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   Sinusitis    Thyroid  nodule    UTI (urinary tract infection)     Past Surgical History:  Procedure Laterality Date   AORTIC VALVE REPLACEMENT N/A 05/21/2022   Procedure: AORTIC VALVE REPLACEMENT (AVR) USING A INSPIRIS TISSUE VALVE;  Surgeon: Lucas Dorise POUR, MD;  Location: [facility] OR;  Service: Open Heart  Surgery;  Laterality: N/A;   CESAREAN SECTION     REPLACEMENT ASCENDING AORTA N/A 05/21/2022   Procedure: REPLACEMENT ASCENDING AORTA USING A 26 X HEMASHIELD AND AND A X 30CM HEMASHEILD GRAFT;  Surgeon: Lucas Dorise POUR, MD;  Location: [facility] OR;  Service: Open Heart Surgery;  Laterality: N/A;   RIGHT HEART CATH AND CORONARY ANGIOGRAPHY N/A 01/31/2022   Procedure: RIGHT HEART CATH AND CORONARY ANGIOGRAPHY;  Surgeon: Wendel Lurena POUR, MD;  Location: [facility] INVASIVE CV LAB;  Service: Cardiovascular;  Laterality: N/A;   TEE WITHOUT CARDIOVERSION N/A 05/21/2022   Procedure: TRANSESOPHAGEAL ECHOCARDIOGRAM (TEE);  Surgeon: Lucas Dorise POUR, MD;  Location: [facility] OR;  Service: Open Heart Surgery;  Laterality: N/A;   TUBAL LIGATION  2007   VARICOSE VEIN SURGERY Right 2015    Social History   Tobacco Use   Smoking status: Never   Smokeless tobacco: Never  Vaping Use   Vaping status: Never Used  Substance Use Topics   Alcohol use: No    Alcohol/week: 0.0 standard drinks of alcohol   Drug use: No    Family History  Problem Relation Age of Onset   Hypertension Mother    Emphysema Mother    Hypertension Father    Thyroid  disease Sister    Breast cancer Sister    Thyroid  disease Sister    Asthma Sister    Anemia Sister    Thyroid  disease Sister    Hyperlipidemia Brother    Thyroid  disease Paternal Aunt    Asthma Daughter     Allergies  Allergen Reactions   Doxycycline  Other (See Comments)    Causes severe heartburn   Ivp Dye [Iodinated Contrast Media] Palpitations   Other Rash    Tegaderm    Medication list has been reviewed and updated.  Current Outpatient Medications on File Prior to Visit  Medication Sig Dispense Refill   acetaminophen  (TYLENOL ) 500 MG tablet Take 1,000 mg by mouth every 6 (six) hours as needed for headache or moderate pain.     amoxicillin  (AMOXIL ) 500 MG capsule Take 4 capsules (2,000 mg total) by mouth 1 hour prior to dental procedures. 12 capsule 1    aspirin  EC 325 MG tablet Take 1 tablet (325 mg total) by mouth daily.     Azelastine -Fluticasone  137-50 MCG/ACT SUSP Place 1 spray into the nose daily. 23 g 5   docusate sodium  (COLACE) 100 MG capsule Take 200 mg by mouth daily.     loratadine (CLARITIN) 10 MG tablet Take 10 mg by mouth daily as needed for allergies.     No current facility-administered medications on file prior to visit.    Review of Systems:  As per HPI- otherwise negative.   Physical Examination: There were no vitals filed for this visit. There were no vitals filed for this visit. There is no height or weight on file to calculate BMI. Ideal Body Weight:    GEN: no acute distress. HEENT: Atraumatic, Normocephalic.  Ears and Nose: No external deformity. CV: RRR, No M/G/R. No JVD. No thrill. No extra heart sounds. PULM: CTA B, no wheezes, crackles, rhonchi. No retractions. No resp. distress. No accessory muscle use. ABD: S, NT, ND, +BS. No rebound. No HSM. EXTR: No c/c/e PSYCH: Normally interactive. Conversant.    Assessment and Plan: *** Physical exam today.  Encouraged healthy diet and exercise routine  Signed Harlene Schroeder, MD

## 2024-04-05 ENCOUNTER — Other Ambulatory Visit: Payer: Self-pay

## 2024-04-05 ENCOUNTER — Encounter: Payer: Self-pay | Admitting: Cardiovascular Disease

## 2024-04-05 ENCOUNTER — Ambulatory Visit: Attending: Cardiovascular Disease | Admitting: Cardiovascular Disease

## 2024-04-05 ENCOUNTER — Other Ambulatory Visit (HOSPITAL_BASED_OUTPATIENT_CLINIC_OR_DEPARTMENT_OTHER): Payer: Self-pay

## 2024-04-05 VITALS — BP 98/60 | HR 64 | Ht 62.0 in | Wt 167.6 lb

## 2024-04-05 DIAGNOSIS — I350 Nonrheumatic aortic (valve) stenosis: Secondary | ICD-10-CM | POA: Diagnosis not present

## 2024-04-05 DIAGNOSIS — Z952 Presence of prosthetic heart valve: Secondary | ICD-10-CM

## 2024-04-05 MED ORDER — AMOXICILLIN 500 MG PO CAPS
2000.0000 mg | ORAL_CAPSULE | Freq: Every day | ORAL | 1 refills | Status: AC
  Filled 2024-04-05: qty 12, 3d supply, fill #0
  Filled 2024-08-31: qty 12, 3d supply, fill #1

## 2024-04-05 MED ORDER — MIDODRINE HCL 10 MG PO TABS
10.0000 mg | ORAL_TABLET | Freq: Three times a day (TID) | ORAL | 0 refills | Status: AC | PRN
  Filled 2024-04-05: qty 30, 10d supply, fill #0

## 2024-04-05 NOTE — Addendum Note (Signed)
 Addended by: Tametha Banning C on: 04/05/2024 09:23 AM   Modules accepted: Orders

## 2024-04-05 NOTE — Patient Instructions (Addendum)
 Medication Instructions:   Start: Midodrine  (Proamatine ) 10 mg every 8 hours as needed for low Blood Pressure  We have also refilled your Amoxicillin  (Amoxil ) 4 tablets (2,000 mg total) 1 hour prior to dental procedures  *If you need a refill on your cardiac medications before your next appointment, please call your pharmacy*  Lab Work: None  Testing/Procedures: Your physician has requested that you have an echocardiogram in June 2026, prior to seeing Dr. Delford in that same month. Echocardiography is a painless test that uses sound waves to create images of your heart. It provides your doctor with information about the size and shape of your heart and how well your heart's chambers and valves are working. This procedure takes approximately one hour. There are no restrictions for this procedure. Please do NOT wear cologne, perfume, aftershave, or lotions (deodorant is allowed). Please arrive 15 minutes prior to your appointment time.  Please note: We ask at that you not bring children with you during ultrasound (echo/ vascular) testing. Due to room size and safety concerns, children are not allowed in the ultrasound rooms during exams. Our front office staff cannot provide observation of children in our lobby area while testing is being conducted. An adult accompanying a patient to their appointment will only be allowed in the ultrasound room at the discretion of the ultrasound technician under special circumstances. We apologize for any inconvenience.   Follow-Up: At [facility], you and your health needs are our priority.  As part of our continuing mission to provide you with exceptional heart care, our providers are all part of one team.  This team includes your primary Cardiologist (physician) and Advanced Practice Providers or APPs (Physician Assistants and Nurse Practitioners) who all work together to provide you with the care you need, when you need it.  Your next appointment:     June 2026, after the Echocardiogram  Provider:   Maude Delford, MD

## 2024-04-05 NOTE — Addendum Note (Signed)
 Addended by: Yolonda Purtle C on: 04/05/2024 09:18 AM   Modules accepted: Orders

## 2024-04-07 ENCOUNTER — Ambulatory Visit (INDEPENDENT_AMBULATORY_CARE_PROVIDER_SITE_OTHER): Payer: Commercial Managed Care - PPO | Admitting: Family Medicine

## 2024-04-07 ENCOUNTER — Other Ambulatory Visit (HOSPITAL_COMMUNITY): Payer: Self-pay

## 2024-04-07 ENCOUNTER — Encounter: Payer: Self-pay | Admitting: Family Medicine

## 2024-04-07 ENCOUNTER — Other Ambulatory Visit: Payer: Self-pay | Admitting: Family Medicine

## 2024-04-07 ENCOUNTER — Telehealth: Payer: Self-pay

## 2024-04-07 ENCOUNTER — Other Ambulatory Visit (HOSPITAL_BASED_OUTPATIENT_CLINIC_OR_DEPARTMENT_OTHER): Payer: Self-pay

## 2024-04-07 VITALS — BP 124/68 | HR 67 | Temp 98.2°F | Ht 62.0 in | Wt 166.0 lb

## 2024-04-07 DIAGNOSIS — Z0000 Encounter for general adult medical examination without abnormal findings: Secondary | ICD-10-CM | POA: Diagnosis not present

## 2024-04-07 DIAGNOSIS — R8290 Unspecified abnormal findings in urine: Secondary | ICD-10-CM | POA: Diagnosis not present

## 2024-04-07 DIAGNOSIS — Z131 Encounter for screening for diabetes mellitus: Secondary | ICD-10-CM | POA: Diagnosis not present

## 2024-04-07 DIAGNOSIS — Z13220 Encounter for screening for lipoid disorders: Secondary | ICD-10-CM | POA: Diagnosis not present

## 2024-04-07 DIAGNOSIS — Z952 Presence of prosthetic heart valve: Secondary | ICD-10-CM

## 2024-04-07 DIAGNOSIS — Z578 Occupational exposure to other risk factors: Secondary | ICD-10-CM

## 2024-04-07 DIAGNOSIS — Z1231 Encounter for screening mammogram for malignant neoplasm of breast: Secondary | ICD-10-CM

## 2024-04-07 DIAGNOSIS — J3089 Other allergic rhinitis: Secondary | ICD-10-CM

## 2024-04-07 DIAGNOSIS — L910 Hypertrophic scar: Secondary | ICD-10-CM

## 2024-04-07 DIAGNOSIS — R7303 Prediabetes: Secondary | ICD-10-CM | POA: Insufficient documentation

## 2024-04-07 LAB — LIPID PANEL
Cholesterol: 206 mg/dL — ABNORMAL HIGH (ref 0–200)
HDL: 75.3 mg/dL (ref >39.00)
LDL Cholesterol: 118 mg/dL — ABNORMAL HIGH (ref 0–99)
NonHDL: 130.81
Total CHOL/HDL Ratio: 3
Triglycerides: 66 mg/dL (ref 0.0–149.0)
VLDL: 13.2 mg/dL (ref 0.0–40.0)

## 2024-04-07 LAB — COMPREHENSIVE METABOLIC PANEL WITH GFR
ALT: 21 U/L (ref 0–35)
AST: 20 U/L (ref 0–37)
Albumin: 4.1 g/dL (ref 3.5–5.2)
Alkaline Phosphatase: 57 U/L (ref 39–117)
BUN: 15 mg/dL (ref 6–23)
CO2: 30 meq/L (ref 19–32)
Calcium: 9.3 mg/dL (ref 8.4–10.5)
Chloride: 102 meq/L (ref 96–112)
Creatinine, Ser: 0.66 mg/dL (ref 0.40–1.20)
GFR: 100.12 mL/min (ref >60.00)
Glucose, Bld: 97 mg/dL (ref 70–99)
Potassium: 4.6 meq/L (ref 3.5–5.1)
Sodium: 140 meq/L (ref 135–145)
Total Bilirubin: 0.6 mg/dL (ref 0.2–1.2)
Total Protein: 7.1 g/dL (ref 6.0–8.3)

## 2024-04-07 LAB — CBC
HCT: 44.1 % (ref 36.0–46.0)
Hemoglobin: 14.2 g/dL (ref 12.0–15.0)
MCHC: 32.2 g/dL (ref 30.0–36.0)
MCV: 88.4 fl (ref 78.0–100.0)
Platelets: 256 K/uL (ref 150.0–400.0)
RBC: 4.99 Mil/uL (ref 3.87–5.11)
RDW: 13.8 % (ref 11.5–15.5)
WBC: 5.7 K/uL (ref 4.0–10.5)

## 2024-04-07 LAB — HEMOGLOBIN A1C: Hgb A1c MFr Bld: 6.1 % (ref 4.6–6.5)

## 2024-04-07 MED ORDER — AZELASTINE-FLUTICASONE 137-50 MCG/ACT NA SUSP
1.0000 | Freq: Every day | NASAL | 5 refills | Status: AC
  Filled 2024-04-07 – 2024-04-08 (×2): qty 23, 30d supply, fill #0
  Filled 2024-08-31: qty 23, 30d supply, fill #1

## 2024-04-07 NOTE — Telephone Encounter (Signed)
 Pharmacy Patient Advocate Encounter   Received notification from RX Request Messages that prior authorization for Azelastine -Fluticasone  137-21mcg is required/requested.   Insurance verification completed.   The patient is insured through [facility] .   Per test claim: PA required; PA submitted to above mentioned insurance via Latent Key/confirmation #/EOC [id number] Status is pending

## 2024-04-08 ENCOUNTER — Other Ambulatory Visit (HOSPITAL_BASED_OUTPATIENT_CLINIC_OR_DEPARTMENT_OTHER): Payer: Self-pay

## 2024-04-08 ENCOUNTER — Other Ambulatory Visit (HOSPITAL_COMMUNITY): Payer: Self-pay

## 2024-04-08 ENCOUNTER — Ambulatory Visit: Payer: Self-pay | Admitting: Family Medicine

## 2024-04-08 LAB — URINE CULTURE
MICRO NUMBER:: 16890905
SPECIMEN QUALITY:: ADEQUATE

## 2024-04-08 LAB — HEPATITIS B SURFACE ANTIBODY, QUANTITATIVE: Hep B S AB Quant (Post): 123 m[IU]/mL (ref >=10)

## 2024-04-08 NOTE — Telephone Encounter (Signed)
 Pharmacy Patient Advocate Encounter  Received notification from [facility] that Prior Authorization for Azelastine -Fluticasone  137-50MCG/ACT suspension  has been APPROVED from 04/07/24 to 04/06/25. Unable to obtain price due to refill too soon rejection, last fill date 04/08/24 next available fill date9/21/25   PA #/Case ID/Reference #: [id number]

## 2024-04-09 ENCOUNTER — Encounter: Payer: Self-pay | Admitting: Family Medicine

## 2024-04-15 ENCOUNTER — Ambulatory Visit (HOSPITAL_BASED_OUTPATIENT_CLINIC_OR_DEPARTMENT_OTHER)
Admission: RE | Admit: 2024-04-15 | Discharge: 2024-04-15 | Disposition: A | Discharge status: Home / Self Care | Source: Ambulatory Visit | Attending: Family Medicine | Admitting: Family Medicine

## 2024-04-15 ENCOUNTER — Encounter (HOSPITAL_BASED_OUTPATIENT_CLINIC_OR_DEPARTMENT_OTHER): Payer: Self-pay

## 2024-04-15 DIAGNOSIS — Z1231 Encounter for screening mammogram for malignant neoplasm of breast: Secondary | ICD-10-CM | POA: Insufficient documentation

## 2024-04-26 ENCOUNTER — Encounter: Payer: Self-pay | Admitting: Family Medicine

## 2024-04-26 ENCOUNTER — Other Ambulatory Visit: Payer: Self-pay | Admitting: Family Medicine

## 2024-04-26 DIAGNOSIS — R928 Other abnormal and inconclusive findings on diagnostic imaging of breast: Secondary | ICD-10-CM

## 2024-05-06 ENCOUNTER — Ambulatory Visit
Admission: RE | Admit: 2024-05-06 | Discharge: 2024-05-06 | Disposition: A | Discharge status: Home / Self Care | Source: Ambulatory Visit | Attending: Family Medicine | Admitting: Family Medicine

## 2024-05-06 ENCOUNTER — Ambulatory Visit

## 2024-05-06 DIAGNOSIS — R928 Other abnormal and inconclusive findings on diagnostic imaging of breast: Secondary | ICD-10-CM

## 2024-05-06 DIAGNOSIS — N6489 Other specified disorders of breast: Secondary | ICD-10-CM | POA: Diagnosis not present

## 2024-07-15 ENCOUNTER — Telehealth: Payer: Self-pay | Admitting: Cardiovascular Disease

## 2024-07-15 NOTE — Telephone Encounter (Signed)
   Pt c/o of Chest Pain: STAT if active CP, including tightness, pressure, jaw pain, radiating pain to shoulder/upper arm/back, CP unrelieved by Nitro. Symptoms reported of SOB, nausea, vomiting, sweating.  1. Are you having CP right now? No    2. Are you experiencing any other symptoms (ex. SOB, nausea, vomiting, sweating)? SOB going up stairs and exercising, LT sided chest pain with what feels like a jolt of electricity    3. Is your CP continuous or coming and going?  coming and going  4. Have you taken Nitroglycerin ? No   5. How long have you been experiencing CP? A few days    6. If NO CP at time of call then end call with telling Pt to call back or call 911 if Chest pain returns prior to return call from triage team.

## 2024-07-15 NOTE — Telephone Encounter (Signed)
 Spoke with pt regarding chest pain. Pt stated that for the last few days she has been having chest pain on the left side that has been radiating down her arm. Pt stated it happens randomly and there does not seem to be a correlation with any thing such as activity. Pt stated she has been feeling a jolt of pain that lasts a few seconds and goes from the left side of her chest, down her arm. The pt also describes feeling a warm, flushed feeling and described it as being similar to when you get contrast for a CT. The patient denied having any chest pain or shortness of breath at the time of this phone call. Pt was given ED precautions and has been scheduled to see Orren Fabry, PA-C on 12/5. Pt verbalized understanding. All questions if any were answered.

## 2024-07-16 ENCOUNTER — Ambulatory Visit (HOSPITAL_COMMUNITY)
Admission: RE | Admit: 2024-07-16 | Discharge: 2024-07-16 | Disposition: A | Source: Ambulatory Visit | Attending: Physician Assistant | Admitting: Physician Assistant

## 2024-07-16 ENCOUNTER — Encounter: Payer: Self-pay | Admitting: Physician Assistant

## 2024-07-16 ENCOUNTER — Ambulatory Visit: Attending: Physician Assistant | Admitting: Physician Assistant

## 2024-07-16 VITALS — BP 102/64 | HR 76 | Ht 62.0 in | Wt 165.0 lb

## 2024-07-16 DIAGNOSIS — I350 Nonrheumatic aortic (valve) stenosis: Secondary | ICD-10-CM | POA: Diagnosis not present

## 2024-07-16 DIAGNOSIS — R0609 Other forms of dyspnea: Secondary | ICD-10-CM | POA: Diagnosis not present

## 2024-07-16 DIAGNOSIS — Z952 Presence of prosthetic heart valve: Secondary | ICD-10-CM

## 2024-07-16 DIAGNOSIS — R079 Chest pain, unspecified: Secondary | ICD-10-CM

## 2024-07-16 DIAGNOSIS — Q2381 Bicuspid aortic valve: Secondary | ICD-10-CM | POA: Diagnosis not present

## 2024-07-16 DIAGNOSIS — R011 Cardiac murmur, unspecified: Secondary | ICD-10-CM | POA: Diagnosis not present

## 2024-07-16 DIAGNOSIS — R002 Palpitations: Secondary | ICD-10-CM

## 2024-07-16 DIAGNOSIS — J479 Bronchiectasis, uncomplicated: Secondary | ICD-10-CM

## 2024-07-16 NOTE — Progress Notes (Signed)
 Cardiology Office Note   Date:  07/16/2024  ID:  [name], DOB [birth year], MRN [id number] PCP: Watt Harlene BROCKS, MD   HeartCare Providers   History of Present Illness Amy Terry is a 53 y.o. female with a past medical history significant for AVR here for follow-up appointment.  Had a cardiac CTA 07/06/2018 with aortic root 3.6 cm, calcium  score 0 with normal right dominant coronary arteries.  Long history of iron  deficiency anemia with ferritin only 2.  Has not been responsive to oral iron .  Seen by hematology 12/31/2021 and set up on IV iron  injections.  Hemoglobin 8.6 and has not had recent EGD/colonoscopy.  TTE 01/18/2022 showed progressive AAS in the setting of bicuspid valve.  EF 55 to 60% with mean gradient 40 and peak 63 mmHg.  Postop TTE done 06/27/2022 with LVEF 55 to 60%, no AR mean gradient of 9 and peak of 16 mmHg.  AVA 1.7 cm.  DVI 0.48.  Cardiac catheterization 01/31/2022 with normal right dominant coronary arteries.  Underwent AVR 05/21/2022 with a 21 mm Edwards Inspira's Resilia pericardial valve with a 26 mm Hemashield graft and 24 mm interposition graft.  Some orthostasis postop and DC'd with midodrine .  She was still working as a engineer, civil (consulting) on 5.  Has a 53 year old and a 53 year old at home.  When she was last seen 04/05/2024 she was having daily palpitations concerned about PAF.  She was bothered by the loud noise that her valve makes and it is hard for her to take her blood pressure at work.  She has a funny sensation of pain in her throat and anterior neck.  No radiation to back.  She sees pulmonary for lingular bronchiectasis with cough.  Her monitor was reviewed 12/2023 with average heart rate of 75 bpm, PACs/PVCs less than 1% of the time.  Echocardiogram 01/15/2024 with EF 65%, AVR mean gradient 19 mmHg mild AR with 21 mm Edwards Inspiris valve.  No cardiac symptoms at that time.  Having crown/dental work and taking SBE prophylaxis.  Today, she  presents with a hx of mild aortic valve regurgitation with chest pain and palpitations.  She has left-sided chest pain described as a jolt like being electrified. Episodes are now more frequent, sometimes more than once daily, and more intense. Pain can occur at rest or be triggered by bending down and exercise and sometimes is associated with a warm flushing sensation. Episodes now last longer than before.  She has exertional shortness of breath with activities such as climbing stairs and more frequent skipped heartbeats, which worsen with sleep deprivation. She has a bicuspid aortic valve discovered incidentally during a prior pneumonia evaluation and had an AVR with ascending aortic repair 05/2022.   She has had two very strong leg cramp episodes over the past few weeks and an intermittent cough for about a week, which she describes as a cardiac cough. She notes weight gain since surgery two years ago but no significant swelling or rapid weight changes.  She has exercise-induced asthma with wheezing during physical activity and is not using an inhaler. Cardiac catheterization two years ago showed no coronary artery disease, and a heart monitor in May showed occasional PVCs.  She is not taking midodrine  regularly because she has not been symptomatic and drinks adequate water. Her BP has been low at times but she is asymptomatic from this.  No edema, orthopnea, PND.    ROS: pertinent ROS in HPI  Studies Reviewed EKG Interpretation Date/Time:  Friday July 16 2024 10:20:29 EST Ventricular Rate:  76 PR Interval:  136 QRS Duration:  74 QT Interval:  380 QTC Calculation: 427 R Axis:   89  Text Interpretation: Normal sinus rhythm Biatrial enlargement Low voltage QRS When compared with ECG of 11-Dec-2023 09:44, No significant change was found Confirmed by [name] [phone]) on 07/16/2024 10:32:49 AM    Echo 01/15/24:   IMPRESSIONS     1. Left ventricular ejection fraction, by  estimation, is 60 to 65%. The  left ventricle has normal function. The left ventricle has no regional  wall motion abnormalities. Left ventricular diastolic parameters were  normal.   2. Right ventricular systolic function is normal. The right ventricular  size is normal. There is normal pulmonary artery systolic pressure.   3. The mitral valve is normal in structure. Trivial mitral valve  regurgitation. No evidence of mitral stenosis.   4. Aortic valve mean gradient 19 mmHg increased from 16 mmHg 06/2022. The  aortic valve has been repaired/replaced. Aortic valve regurgitation is  mild. No aortic stenosis is present. There is a 21 mm Eswards Inspiris  Resilia pericardial valve present in  the aortic position. Aortic valve area, by VTI measures 0.99 cm. Aortic  valve mean gradient measures 19.0 mmHg. Aortic valve Vmax measures 2.94  m/s.   5. Supra-coronary replacement of the ascending aorta (hemi-arch) with 26  mm Hemashield graft. Aortic root/ascending aorta has been  repaired/replaced.   6. The inferior vena cava is normal in size with greater than 50%  respiratory variability, suggesting right atrial pressure of 3 mmHg    Monitor:  12/2023:   Patch Wear Time:  7 days and 3 hours (2025-05-05T11:34:12-398 to 2025-05-12T14:47:21-0400)   Patient had a min HR of 53 bpm, max HR of 119 bpm, and avg HR of 75 bpm. Predominant underlying rhythm was Sinus Rhythm. Isolated SVEs were rare (<1.0%), and no SVE Couplets or SVE Triplets were present. Isolated VEs were rare (<1.0%), and no VE Couplets  or VE Triplets were present. Ventricular Trigeminy was present.    Maude Emmer MD [facility]     Physical Exam VS:  BP 102/64   Pulse 76   Ht 5' 2 (1.575 m)   Wt 165 lb (74.8 kg)   SpO2 97%   BMI 30.18 kg/m        Wt Readings from Last 3 Encounters:  07/16/24 165 lb (74.8 kg)  04/07/24 166 lb (75.3 kg)  04/05/24 167 lb 9.6 oz (76 kg)    GEN: Well nourished, well developed in no acute  distress NECK: No JVD; No carotid bruits CARDIAC: RRR with occasional PVC, + systolic murmur heard best along the LSB, rubs, gallops RESPIRATORY:  Clear to auscultation without rales, wheezing or rhonchi  ABDOMEN: Soft, non-tender, non-distended EXTREMITIES:  No edema; No deformity   ASSESSMENT AND PLAN  Palpitations Intermittent palpitations with occasional PVCs, not affecting cardiac function. No dangerous arrhythmias. - Consider low dose metoprolol  as needed for symptomatic PVCs. -recent monitor reviewed with the patient  Chest pain of uncertain etiology Intermittent left-sided chest pain with no coronary artery disease on previous catheterization. Stress test preferred due to contrast allergy (offered coronary CTA) - Ordered treadmill stress test.(POET) - Ordered chest x-ray to assess sternal wires.  Bicuspid aortic valve with mild aortic regurgitation, status post aortic valve replacement Status post valve replacement with mild regurgitation. Stable ascending aorta and improved ejection fraction.Echo reviewed with the patient in detail. - Continue monitoring with echocardiograms as  needed.  Heart murmur No new findings on echocardiogram. - Continue monitoring with echocardiograms yearly.   Exercise-induced asthma Wheezing during exercise, suggestive of exercise-induced asthma. - Consider discussing as needed inhaler with PCP  Leg cramps Intermittent leg cramps, possibly due to electrolyte imbalance or kidney function. - Ordered lab work to check electrolytes and kidney function.  Orthostatic hypotension -no recent symptoms of dizziness or lightheadedness    Informed Consent   Shared Decision Making/Informed Consent The risks [chest pain, shortness of breath, cardiac arrhythmias, dizziness, blood pressure fluctuations, myocardial infarction, stroke/transient ischemic attack, and life-threatening complications (estimated to be 1 in 10,000)], benefits (risk stratification,  diagnosing coronary artery disease, treatment guidance) and alternatives of an exercise tolerance test were discussed in detail with Ms. Rodriges and she agrees to proceed.     Dispo: She can follow-up in 4 weeks post testing  Signed, Orren LOISE Fabry, PA-C

## 2024-07-16 NOTE — Patient Instructions (Signed)
 Medication Instructions:  Your physician recommends that you continue on your current medications as directed. Please refer to the Current Medication list given to you today.  *If you need a refill on your cardiac medications before your next appointment, please call your pharmacy*  Lab Work: Today: BMET, Magnesium   If you have labs (blood work) drawn today and your tests are completely normal, you will receive your results only by: MyChart Message (if you have MyChart) OR A paper copy in the mail If you have any lab test that is abnormal or we need to change your treatment, we will call you to review the results.  Testing/Procedures: Your provider recommends you have a CHEST X-RAY.  Your physician has requested that you have an exercise tolerance test. For further information please visit [email or web address] Please also follow instruction sheet, as given.  Follow-Up: At [facility], you and your health needs are our priority.  As part of our continuing mission to provide you with exceptional heart care, our providers are all part of one team.  This team includes your primary Cardiologist (physician) and Advanced Practice Providers or APPs (Physician Assistants and Nurse Practitioners) who all work together to provide you with the care you need, when you need it.  Your next appointment:   1 month(s)  Provider:   Orren Fabry, PA-C

## 2024-07-17 LAB — MAGNESIUM: Magnesium: 2.1 mg/dL (ref 1.6–2.3)

## 2024-07-17 LAB — BASIC METABOLIC PANEL WITH GFR
BUN/Creatinine Ratio: 26 — ABNORMAL HIGH (ref 9–23)
BUN: 19 mg/dL (ref 6–24)
CO2: 25 mmol/L (ref 20–29)
Calcium: 9.5 mg/dL (ref 8.7–10.2)
Chloride: 102 mmol/L (ref 96–106)
Creatinine, Ser: 0.74 mg/dL (ref 0.57–1.00)
Glucose: 96 mg/dL (ref 70–99)
Potassium: 4.6 mmol/L (ref 3.5–5.2)
Sodium: 139 mmol/L (ref 134–144)
eGFR: 97 mL/min/1.73 (ref >59)

## 2024-07-19 ENCOUNTER — Ambulatory Visit: Payer: Self-pay | Admitting: Physician Assistant

## 2024-07-29 ENCOUNTER — Telehealth (HOSPITAL_COMMUNITY): Payer: Self-pay | Admitting: *Deleted

## 2024-07-29 NOTE — Telephone Encounter (Signed)
 Reminder call with instructions given on voice mail for upcoming GXT on 08/06/24 at 11:30

## 2024-08-06 ENCOUNTER — Ambulatory Visit (HOSPITAL_COMMUNITY)
Admission: RE | Admit: 2024-08-06 | Discharge: 2024-08-06 | Disposition: A | Discharge status: Home / Self Care | Source: Ambulatory Visit | Attending: Physician Assistant | Admitting: Physician Assistant

## 2024-08-06 DIAGNOSIS — R0609 Other forms of dyspnea: Secondary | ICD-10-CM | POA: Insufficient documentation

## 2024-08-06 LAB — EXERCISE TOLERANCE TEST
Angina Index: 0
Duke Treadmill Score: 8
Estimated workload: 9.3
Exercise duration (min): 7 min
Exercise duration (sec): 30 s
MPHR: 167 {beats}/min
Peak HR: 160 {beats}/min
Percent HR: 95 %
Rest HR: 83 {beats}/min
ST Depression (mm): 0 mm

## 2024-08-09 NOTE — Progress Notes (Signed)
Patient has been notified directly; all questions, if any, were answered. Patient voiced understanding.   

## 2024-08-27 ENCOUNTER — Ambulatory Visit: Attending: Physician Assistant | Admitting: Physician Assistant

## 2024-08-27 ENCOUNTER — Encounter: Payer: Self-pay | Admitting: Physician Assistant

## 2024-08-27 VITALS — BP 100/82 | HR 75 | Ht 62.0 in | Wt 167.0 lb

## 2024-08-27 DIAGNOSIS — Z952 Presence of prosthetic heart valve: Secondary | ICD-10-CM | POA: Diagnosis not present

## 2024-08-27 DIAGNOSIS — Q2381 Bicuspid aortic valve: Secondary | ICD-10-CM

## 2024-08-27 DIAGNOSIS — I350 Nonrheumatic aortic (valve) stenosis: Secondary | ICD-10-CM | POA: Diagnosis not present

## 2024-08-27 DIAGNOSIS — R0609 Other forms of dyspnea: Secondary | ICD-10-CM | POA: Diagnosis not present

## 2024-08-27 NOTE — Progress Notes (Signed)
 " Cardiology Office Note   Date:  08/27/2024  ID:  Amy Terry, DOB [birth year], MRN [id number] PCP: Watt Harlene BROCKS, MD  [facility] Providers   History of Present Illness Amy Terry is a 53 y.o. female with a past medical history significant for AVR here for follow-up appointment.  Had a cardiac CTA 07/06/2018 with aortic root 3.6 cm, calcium  score 0 with normal right dominant coronary arteries.  Long history of iron  deficiency anemia with ferritin only 2.  Has not been responsive to oral iron .  Seen by hematology 12/31/2021 and set up on IV iron  injections.  Hemoglobin 8.6 and has not had recent EGD/colonoscopy.  TTE 01/18/2022 showed progressive AAS in the setting of bicuspid valve.  EF 55 to 60% with mean gradient 40 and peak 63 mmHg.  Postop TTE done 06/27/2022 with LVEF 55 to 60%, no AR mean gradient of 9 and peak of 16 mmHg.  AVA 1.7 cm.  DVI 0.48.  Cardiac catheterization 01/31/2022 with normal right dominant coronary arteries.  Underwent AVR 05/21/2022 with a 21 mm Edwards Inspira's Resilia pericardial valve with a 26 mm Hemashield graft and 24 mm interposition graft.  Some orthostasis postop and DC'd with midodrine .  She was still working as a engineer, civil (consulting) on 5.  Has a 54 year old and a 54 year old at home.  When she was last seen 04/05/2024 she was having daily palpitations concerned about PAF.  She was bothered by the loud noise that her valve makes and it is hard for her to take her blood pressure at work.  She has a funny sensation of pain in her throat and anterior neck.  No radiation to back.  She sees pulmonary for lingular bronchiectasis with cough.  Her monitor was reviewed 12/2023 with average heart rate of 75 bpm, PACs/PVCs less than 1% of the time.  Echocardiogram 01/15/2024 with EF 65%, AVR mean gradient 19 mmHg mild AR with 21 mm Edwards Inspiris valve.  No cardiac symptoms at that time.  Having crown/dental work and taking SBE prophylaxis.  I saw her 12/5,   she presents with a hx of mild aortic valve regurgitation with chest pain and palpitations.  She has left-sided chest pain described as a jolt like being electrified. Episodes are now more frequent, sometimes more than once daily, and more intense. Pain can occur at rest or be triggered by bending down and exercise and sometimes is associated with a warm flushing sensation. Episodes now last longer than before.  She has exertional shortness of breath with activities such as climbing stairs and more frequent skipped heartbeats, which worsen with sleep deprivation. She has a bicuspid aortic valve discovered incidentally during a prior pneumonia evaluation and had an AVR with ascending aortic repair 05/2022.   She has had two very strong leg cramp episodes over the past few weeks and an intermittent cough for about a week, which she describes as a cardiac cough. She notes weight gain since surgery two years ago but no significant swelling or rapid weight changes.  She has exercise-induced asthma with wheezing during physical activity and is not using an inhaler. Cardiac catheterization two years ago showed no coronary artery disease, and a heart monitor in May showed occasional PVCs.  She is not taking midodrine  regularly because she has not been symptomatic and drinks adequate water. Her BP has been low at times but she is asymptomatic from this.  No edema, orthopnea, PND.   Today, she has a hx of aortic valve  regurgitation who presents with intermittent left-sided chest pain.  She reports intermittent sharp, shooting pain in the left chest that feels like being electrocuted for 1-2 seconds. Episodes are random, not continuous, and not consistently related to exertion, though they sometimes occur with exertion or deep inspiration. She denies persistent chest pain, dizziness, or lightheadedness.  She has palpitations with prior monitoring showing occasional PVCs from the ventricle, about 1% burden.  She notes tachycardia with exertion, such as lifting patients, without activity restriction. Her last heart monitor was in May of last year, and symptoms are unchanged since then.  She has mild aortic valve regurgitation. Echocardiogram in June of last year showed mild regurgitation without stenosis. She is scheduled for repeat echocardiogram this June.  She has leg and foot cramps. Prior potassium and magnesium  levels were normal. She has exercise-induced asthma.  Her LDL was 118 with a coronary calcium  score of zero on CT. She is taking aspirin  325 mg daily. She has not been taking midodrine  regularly since her surgery. Her blood pressure runs low around 100/82 without dizziness or lightheadedness.  Reports no shortness of breath nor dyspnea on exertion. No edema, orthopnea, PND.   Discussed the use of AI scribe software for clinical note transcription with the patient, who gave verbal consent to proceed.  ROS: pertinent ROS in HPI  Studies Reviewed      Echo 01/15/24:   IMPRESSIONS     1. Left ventricular ejection fraction, by estimation, is 60 to 65%. The  left ventricle has normal function. The left ventricle has no regional  wall motion abnormalities. Left ventricular diastolic parameters were  normal.   2. Right ventricular systolic function is normal. The right ventricular  size is normal. There is normal pulmonary artery systolic pressure.   3. The mitral valve is normal in structure. Trivial mitral valve  regurgitation. No evidence of mitral stenosis.   4. Aortic valve mean gradient 19 mmHg increased from 16 mmHg 06/2022. The  aortic valve has been repaired/replaced. Aortic valve regurgitation is  mild. No aortic stenosis is present. There is a 21 mm Eswards Inspiris  Resilia pericardial valve present in  the aortic position. Aortic valve area, by VTI measures 0.99 cm. Aortic  valve mean gradient measures 19.0 mmHg. Aortic valve Vmax measures 2.94  m/s.   5.  Supra-coronary replacement of the ascending aorta (hemi-arch) with 26  mm Hemashield graft. Aortic root/ascending aorta has been  repaired/replaced.   6. The inferior vena cava is normal in size with greater than 50%  respiratory variability, suggesting right atrial pressure of 3 mmHg    Monitor:  12/2023:   Patch Wear Time:  7 days and 3 hours (2025-05-05T11:34:12-398 to 2025-05-12T14:47:21-0400)   Patient had a min HR of 53 bpm, max HR of 119 bpm, and avg HR of 75 bpm. Predominant underlying rhythm was Sinus Rhythm. Isolated SVEs were rare (<1.0%), and no SVE Couplets or SVE Triplets were present. Isolated VEs were rare (<1.0%), and no VE Couplets  or VE Triplets were present. Ventricular Trigeminy was present.    Maude Emmer MD [facility]     Physical Exam VS:  BP 100/82   Pulse 75   Ht 5' 2 (1.575 m)   Wt 167 lb (75.8 kg)   SpO2 97%   BMI 30.54 kg/m        Wt Readings from Last 3 Encounters:  08/27/24 167 lb (75.8 kg)  07/16/24 165 lb (74.8 kg)  04/07/24 166 lb (75.3 kg)  GEN: Well nourished, well developed in no acute distress NECK: No JVD; No carotid bruits CARDIAC: RRR with occasional PVC, + systolic murmur heard best along the LSB, rubs, gallops RESPIRATORY:  Clear to auscultation without rales, wheezing or rhonchi  ABDOMEN: Soft, non-tender, non-distended EXTREMITIES:  No edema; No deformity   ASSESSMENT AND PLAN  Chest wall pain Intermittent sharp pain on left side, likely costochondritis or nerve irritation. Non-cardiac origin confirmed by normal stress test. - Avoid upper body exercises. - Engage in lower body, core exercises, or walking. - Discuss with Dr. Ubaldo for further suggestions.  Aortic valve regurgitation status post aortic valve replacement Mild regurgitation with prominent murmur, suggesting possible progression. - Rescheduled echocardiogram to earlier than June 10th.  Premature ventricular contractions Rare PVCs (1%) with stable symptoms. -  Continue monitoring symptoms.  Dyspnea on exertion Rare PVCs noted previously on her last monitor         Dispo: She can follow-up in 6 months with MD  Signed, Orren LOISE Fabry, PA-C   "

## 2024-08-27 NOTE — Patient Instructions (Signed)
 Medication Instructions:  Your physician recommends that you continue on your current medications as directed. Please refer to the Current Medication list given to you today. *If you need a refill on your cardiac medications before your next appointment, please call your pharmacy*  Lab Work: None ordered If you have labs (blood work) drawn today and your tests are completely normal, you will receive your results only by: MyChart Message (if you have MyChart) OR A paper copy in the mail If you have any lab test that is abnormal or we need to change your treatment, we will call you to review the results.  Testing/Procedures: Please schedule in 6 weeks Your physician has requested that you have an echocardiogram. Echocardiography is a painless test that uses sound waves to create images of your heart. It provides your doctor with information about the size and shape of your heart and how well your hearts chambers and valves are working. This procedure takes approximately one hour. There are no restrictions for this procedure. Please do NOT wear cologne, perfume, aftershave, or lotions (deodorant is allowed). Please arrive 15 minutes prior to your appointment time.  Please note: We ask at that you not bring children with you during ultrasound (echo/ vascular) testing. Due to room size and safety concerns, children are not allowed in the ultrasound rooms during exams. Our front office staff cannot provide observation of children in our lobby area while testing is being conducted. An adult accompanying a patient to their appointment will only be allowed in the ultrasound room at the discretion of the ultrasound technician under special circumstances. We apologize for any inconvenience.   Follow-Up: At [facility], you and your health needs are our priority.  As part of our continuing mission to provide you with exceptional heart care, our providers are all part of one team.  This team  includes your primary Cardiologist (physician) and Advanced Practice Providers or APPs (Physician Assistants and Nurse Practitioners) who all work together to provide you with the care you need, when you need it.  Your next appointment:   6 month(s)  Provider:   Maude Emmer, MD    We recommend signing up for the patient portal called MyChart.  Sign up information is provided on this After Visit Summary.  MyChart is used to connect with patients for Virtual Visits (Telemedicine).  Patients are able to view lab/test results, encounter notes, upcoming appointments, etc.  Non-urgent messages can be sent to your provider as well.   To learn more about what you can do with MyChart, go to [email or web address].   Other Instructions

## 2024-09-01 ENCOUNTER — Other Ambulatory Visit (HOSPITAL_BASED_OUTPATIENT_CLINIC_OR_DEPARTMENT_OTHER): Payer: Self-pay

## 2024-09-01 ENCOUNTER — Other Ambulatory Visit: Payer: Self-pay

## 2024-09-01 MED ORDER — MELOXICAM 15 MG PO TABS
15.0000 mg | ORAL_TABLET | Freq: Every day | ORAL | 1 refills | Status: AC
  Filled 2024-09-01: qty 30, 30d supply, fill #0

## 2024-09-15 ENCOUNTER — Other Ambulatory Visit (HOSPITAL_BASED_OUTPATIENT_CLINIC_OR_DEPARTMENT_OTHER): Payer: Self-pay

## 2024-11-15 ENCOUNTER — Ambulatory Visit: Admitting: Dermatology

## 2025-01-19 ENCOUNTER — Other Ambulatory Visit (HOSPITAL_COMMUNITY)
